# Patient Record
Sex: Male | Born: 1935 | Race: White | Hispanic: No | Smoking: Former smoker
Health system: Southern US, Community
[De-identification: ages and names within clinical notes are randomized; demographics above are authoritative.]

## PROBLEM LIST (undated history)

## (undated) DIAGNOSIS — R911 Solitary pulmonary nodule: Secondary | ICD-10-CM

## (undated) DIAGNOSIS — R945 Abnormal results of liver function studies: Secondary | ICD-10-CM

## (undated) DIAGNOSIS — L570 Actinic keratosis: Secondary | ICD-10-CM

## (undated) DIAGNOSIS — I10 Essential (primary) hypertension: Secondary | ICD-10-CM

## (undated) DIAGNOSIS — N400 Enlarged prostate without lower urinary tract symptoms: Secondary | ICD-10-CM

## (undated) DIAGNOSIS — E785 Hyperlipidemia, unspecified: Secondary | ICD-10-CM

## (undated) DIAGNOSIS — J189 Pneumonia, unspecified organism: Secondary | ICD-10-CM

## (undated) DIAGNOSIS — R0602 Shortness of breath: Secondary | ICD-10-CM

## (undated) DIAGNOSIS — D649 Anemia, unspecified: Secondary | ICD-10-CM

## (undated) DIAGNOSIS — R7989 Other specified abnormal findings of blood chemistry: Secondary | ICD-10-CM

## (undated) DIAGNOSIS — Q825 Congenital non-neoplastic nevus: Secondary | ICD-10-CM

## (undated) DIAGNOSIS — K5900 Constipation, unspecified: Secondary | ICD-10-CM

## (undated) DIAGNOSIS — K824 Cholesterolosis of gallbladder: Secondary | ICD-10-CM

## (undated) DIAGNOSIS — N200 Calculus of kidney: Secondary | ICD-10-CM

## (undated) DIAGNOSIS — M1990 Unspecified osteoarthritis, unspecified site: Secondary | ICD-10-CM

## (undated) DIAGNOSIS — E039 Hypothyroidism, unspecified: Secondary | ICD-10-CM

## (undated) DIAGNOSIS — Z77090 Contact with and (suspected) exposure to asbestos: Secondary | ICD-10-CM

## (undated) HISTORY — PX: APPENDECTOMY: SHX54

## (undated) HISTORY — DX: Cholesterolosis of gallbladder: K82.4

## (undated) HISTORY — DX: Contact with and (suspected) exposure to asbestos: Z77.090

## (undated) HISTORY — PX: COLONOSCOPY: SHX174

## (undated) HISTORY — DX: Benign prostatic hyperplasia without lower urinary tract symptoms: N40.0

## (undated) HISTORY — DX: Abnormal results of liver function studies: R94.5

## (undated) HISTORY — DX: Anemia, unspecified: D64.9

## (undated) HISTORY — PX: BACK SURGERY: SHX140

## (undated) HISTORY — PX: TONSILLECTOMY: SUR1361

## (undated) HISTORY — PX: OTHER SURGICAL HISTORY: SHX169

## (undated) HISTORY — DX: Hypothyroidism, unspecified: E03.9

## (undated) HISTORY — DX: Actinic keratosis: L57.0

## (undated) HISTORY — DX: Solitary pulmonary nodule: R91.1

## (undated) HISTORY — PX: CERVICAL DISC SURGERY: SHX588

## (undated) HISTORY — DX: Essential (primary) hypertension: I10

## (undated) HISTORY — PX: FOOT SURGERY: SHX648

## (undated) HISTORY — DX: Hyperlipidemia, unspecified: E78.5

## (undated) HISTORY — DX: Other specified abnormal findings of blood chemistry: R79.89

---

## 2000-02-07 ENCOUNTER — Ambulatory Visit (HOSPITAL_BASED_OUTPATIENT_CLINIC_OR_DEPARTMENT_OTHER): Admission: RE | Admit: 2000-02-07 | Discharge: 2000-02-07 | Payer: Self-pay | Admitting: Podiatry

## 2002-10-08 ENCOUNTER — Encounter: Payer: Self-pay | Admitting: Orthopaedic Surgery

## 2002-10-08 ENCOUNTER — Encounter: Admission: RE | Admit: 2002-10-08 | Discharge: 2002-10-08 | Payer: Self-pay | Admitting: Orthopaedic Surgery

## 2002-10-09 ENCOUNTER — Encounter: Payer: Self-pay | Admitting: Orthopaedic Surgery

## 2002-10-09 ENCOUNTER — Encounter: Admission: RE | Admit: 2002-10-09 | Discharge: 2002-10-09 | Payer: Self-pay | Admitting: Orthopaedic Surgery

## 2002-10-10 ENCOUNTER — Ambulatory Visit (HOSPITAL_BASED_OUTPATIENT_CLINIC_OR_DEPARTMENT_OTHER): Admission: RE | Admit: 2002-10-10 | Discharge: 2002-10-11 | Payer: Self-pay | Admitting: Orthopaedic Surgery

## 2002-11-05 ENCOUNTER — Encounter: Payer: Self-pay | Admitting: Thoracic Surgery

## 2002-11-05 ENCOUNTER — Ambulatory Visit (HOSPITAL_COMMUNITY): Admission: RE | Admit: 2002-11-05 | Discharge: 2002-11-05 | Payer: Self-pay | Admitting: Thoracic Surgery

## 2002-11-05 ENCOUNTER — Encounter (INDEPENDENT_AMBULATORY_CARE_PROVIDER_SITE_OTHER): Payer: Self-pay | Admitting: *Deleted

## 2003-01-01 ENCOUNTER — Encounter: Payer: Self-pay | Admitting: Thoracic Surgery

## 2003-01-01 ENCOUNTER — Encounter: Admission: RE | Admit: 2003-01-01 | Discharge: 2003-01-01 | Payer: Self-pay | Admitting: Thoracic Surgery

## 2003-05-01 ENCOUNTER — Encounter: Admission: RE | Admit: 2003-05-01 | Discharge: 2003-05-01 | Payer: Self-pay | Admitting: Thoracic Surgery

## 2003-05-03 ENCOUNTER — Emergency Department (HOSPITAL_COMMUNITY): Admission: EM | Admit: 2003-05-03 | Discharge: 2003-05-03 | Payer: Self-pay | Admitting: Emergency Medicine

## 2003-09-24 ENCOUNTER — Encounter: Admission: RE | Admit: 2003-09-24 | Discharge: 2003-09-24 | Payer: Self-pay | Admitting: Thoracic Surgery

## 2004-02-25 ENCOUNTER — Encounter: Admission: RE | Admit: 2004-02-25 | Discharge: 2004-02-25 | Payer: Self-pay | Admitting: Thoracic Surgery

## 2008-03-31 ENCOUNTER — Encounter: Admission: RE | Admit: 2008-03-31 | Discharge: 2008-03-31 | Payer: Self-pay | Admitting: Internal Medicine

## 2008-05-09 ENCOUNTER — Encounter: Admission: RE | Admit: 2008-05-09 | Discharge: 2008-05-09 | Payer: Self-pay | Admitting: Internal Medicine

## 2008-06-11 ENCOUNTER — Ambulatory Visit (HOSPITAL_COMMUNITY): Admission: RE | Admit: 2008-06-11 | Discharge: 2008-06-11 | Payer: Self-pay | Admitting: *Deleted

## 2008-08-16 ENCOUNTER — Encounter: Admission: RE | Admit: 2008-08-16 | Discharge: 2008-08-16 | Payer: Self-pay | Admitting: Internal Medicine

## 2010-01-20 ENCOUNTER — Encounter: Admission: RE | Admit: 2010-01-20 | Discharge: 2010-01-20 | Payer: Self-pay | Admitting: Internal Medicine

## 2010-08-24 NOTE — Op Note (Signed)
Christopher Roach, Christopher Roach                ACCOUNT NO.:  [id number]   MEDICAL RECORD NO.:  [id number]          PATIENT TYPE:  AMB   LOCATION:  ENDO                         FACILITY:  [facility]   PHYSICIAN:  Georgiana Spinner, M.D.    DATE OF BIRTH:  1931-02-09   DATE OF PROCEDURE:  06/11/2008  DATE OF DISCHARGE:                               OPERATIVE REPORT   PROCEDURE:  Upper endoscopy.   INDICATIONS:  Gastroesophageal reflux disease.  Hemoccult positivity.   ANESTHESIA:  Fentanyl 50 mcg, Versed 3 mg.   PROCEDURE:  With the patient mildly sedated in the left lateral  decubitus position, the Pentax videoscopic endoscope was inserted in the  mouth passed under direct vision through the esophagus which appeared  normal into the stomach.  Fundus, body, antrum, duodenal bulb, second  portion of duodenum all appeared normal.  From this point the endoscope  was slowly withdrawn taking circumferential views of the duodenal mucosa  until the endoscope been pulled back into stomach placed in retroflexion  to view the stomach from below.  The endoscope was straightened and  withdrawn taking circumferential views of the remaining gastric and  esophageal mucosa.  The patient's vital signs, pulse oximeter remained  stable.  The patient tolerated procedure well without apparent  complications.   FINDINGS:  Negative exam.   PLAN:  Proceed to colonoscopy.           ______________________________  Georgiana Spinner, M.D.     GMO/MEDQ  D:  06/11/2008  T:  06/11/2008  Job:  [id number]

## 2010-08-24 NOTE — Op Note (Signed)
[name], Christopher Roach                ACCOUNT NO.:  [id number]   MEDICAL RECORD NO.:  [id number]          PATIENT TYPE:  AMB   LOCATION:  ENDO                         FACILITY:  [facility]   PHYSICIAN:  Georgiana Spinner, M.D.    DATE OF BIRTH:  01/10/1931   DATE OF PROCEDURE:  DATE OF DISCHARGE:                               OPERATIVE REPORT   PROCEDURE:  Colonoscopy.   INDICATIONS:  Hemoccult positivity.   ANESTHESIA:  Fentanyl 25 mcg, Versed 2 mg.   PROCEDURE:  With the patient mildly sedated in the left lateral  decubitus position a rectal exam was performed which was unremarkable to  my limited examination.  Subsequently, the Pentax videoscopic  colonoscope was inserted in the rectum, passed under direct vision to  the cecum identified by ileocecal valve and appendiceal orifice, both of  which were photographed.  From this point the colonoscope was slowly  withdrawn taking circumferential views of the colonic mucosa, stopping  in the rectum which appeared normal on direct and showed hemorrhoids on  retroflexed view.  The endoscope was straightened and withdrawn.  The  patient's vital signs and pulse oximeter remained stable.  The patient  tolerated the procedure well without apparent complications.   FINDINGS:  Hemorrhoids and some diverticulosis, fairly mild in the  sigmoid colon.  Otherwise unremarkable exam.   PLAN:  To have the patient follow-up with me as an outpatient.           ______________________________  Georgiana Spinner, M.D.     GMO/MEDQ  D:  06/11/2008  T:  06/11/2008  Job:  [id number]

## 2010-08-27 NOTE — Op Note (Signed)
NAMEAITAN, Christopher Roach                          ACCOUNT NO.:  [id number]   MEDICAL RECORD NO.:  [id number]                   PATIENT TYPE:  AMB   LOCATION:  DSC                                  FACILITY:  [facility]   PHYSICIAN:  Claude Manges. Cleophas Dunker, M.D.            DATE OF BIRTH:  01/25/1931   DATE OF PROCEDURE:  10/10/2002  DATE OF DISCHARGE:                                 OPERATIVE REPORT   PREOPERATIVE DIAGNOSIS:  1. Rotator cuff tear with impingement of right shoulder.  2. Degenerative joint disease acromioclavicular joint, right shoulder.   POSTOPERATIVE DIAGNOSIS:  1. Rotator cuff tear with impingement of right shoulder.  2. Degenerative joint disease acromioclavicular joint, right shoulder.   OPERATION PERFORMED:  1. Arthroscopic subacromial decompression.  2. Open distal clavicle resection.  3. Mini open rotator cuff tear repair.   SURGEON:  Claude Manges. Cleophas Dunker, M.D.   ASSISTANT:  Richardean Canal, P.A.   ANESTHESIA:  General orotracheal anesthesia.   COMPLICATIONS:  None.   INDICATIONS FOR PROCEDURE:  The patient is a 77 year old gentleman who has  had a progressive five-month history of right shoulder pain.  He fell about  a year and a half ago on a fishing trip and jammed his elbow against his  shoulder.  He thinks that is probably the cause of his pain, but it has  reached a point where he has difficulty raising his arm over his head,  performing his activities of daily living, his avocational endeavors and  even trouble sleeping.  He has had an MRI scan that reveals a full thickness  tear of the leading edge of the supraspinatus tendon associated with  tendinopathy of the infraspinatus and biceps tendons.  He has mild AC joint  degenerative changes with capsular hypertrophy.  He is tender over the [facility]  joint.  He is now to have an arthroscopic evaluation and distal clavicle  resection and rotator cuff tear repair.   DESCRIPTION OF PROCEDURE:  With the patient  comfortable on the operating  table, and under general orotracheal anesthesia and supplemental  interscalene block, the patient was placed in semisitting position with the  shoulder frame.  The right shoulder was then prepped with DuraPrep from the  base of the neck circumferentially below the elbow.  Sterile draping was  performed.   A marking pen was used to outline the [facility] joint.  The acromion and the  coracoid at a point a fingerbreadth inferomedial to the posterior angle of  the acromion, a small stab wound was made prior to which 0.25% Marcaine with  epinephrine was injected.  The arthroscope was easily placed into the  shoulder joint.  Arthroscopic evaluation revealed an intact humeral head  without chondromalacia of the glenoid or the humeral head.  The anterior  glenoid labrum was intact as was the biceps tendon.  I did not see an  obvious rotator cuff tear.  The arthroscope was then placed in the subacromial space posteriorly.  The  diagnostic arthroscopy of the subacromial space was performed.  There was  abundant bursal tissue and obvious impingement anteriorly.  I thought I  could recognize a cuff tear of the supraspinatus.  There were degenerative  changes at the [facility] joint with inferior impingement of the clavicle on the  rotator cuff.  A second and third portal was established mid laterally  anteriorly and arthroscopic subacromial decompression was performed using  the ArthroCare wand, the great white shaver and the 6 mm hooded bur.  I had  a very nice decompression.   An open distal clavicle resection was performed utilizing about a 3/4 inch  incision directly over the [facility] joint.  By sharp dissection, incision was  carried down to subcutaneous tissue done by Bovie dissection.  Soft tissue  was dissected to the level of the [facility] joint.  The [facility] joint capsule was  incised.  Subperiosteal dissection was performed along the distal centimeter  of the clavicle. Oscillating saw was  used to remove the distal clavicle.  Bone wax was applied.  A rasp was used to further debride any extraneous  distal clavicle and with finger palpation, I could not feel any evidence of  impingement beneath the acromion or the distal remaining clavicle.  The  wound was irrigated with saline solution.  The [facility] joint capsule was closed  with interrupted 0 Vicryl, subcu with 2-0 Vicryl, skin closed with skin  clips.   A mini open incision was then performed at the junction of the anterolateral  shoulder.  About a 1-1/2 inch incision was utilized and by sharp dissection  carried down to subcutaneous tissue.  The anterior portal was incorporated  into the incision.  By blunt dissection, a raphe within the deltoid muscle  was identified and just separated.  The subacromial space was identified and  irrigation fluid was removed.  With motion of the arm I could see an obvious  tear of the rotator cuff at the supraspinatus attachment to the humeral  head.  It was full thickness.  The edges were rounded and these were sharply  debrided.  As there was no tissue to repair,  I used one Mitek anchor with  an excellent repair.  Again, by palpation, I felt I had an excellent  subacromial decompression.  The wound was irrigated with saline solution,  deltoid muscle and fascia were closed anatomically with interrupted 0  Vicryl.  The subcu closed with 2-0 Vicryl, skin closed with skin clips.  0.25% Marcaine with epinephrine injected into the wound edges.  Sterile  bulky dressing was applied followed by a sling.   PLAN:  Recovery care center.  Percocet for pain.  Office one week.                                               Claude Manges. Cleophas Dunker, M.D.    PWW/MEDQ  D:  10/10/2002  T:  10/11/2002  Job:  [id number]

## 2010-08-27 NOTE — Op Note (Signed)
[facility]  Patient:    [name], Christopher Roach                       MRN: [id number] Proc. Date: 02/07/00 Adm. Date:  [id number] Disc. Date: [id number] Attending:  Cordella Register                           Operative Report  PREOPERATIVE DIAGNOSIS:  Chronic plantar fasciitis of the left.  POSTOPERATIVE DIAGNOSIS:  Chronic plantar fasciitis of the left.  OPERATION:  Endoscopic release of the medial and partial central band of the left plantar fascia.  SURGEON:  Cordella Register, D.P.M.  ANESTHESIA:  IV sedation with local infiltration.  HEMOSTASIS:  Ankle tourniquet left.  INDICATION FOR PROCEDURE:  Chronic pain with an inability to get better with shoe gear modifications, anti-inflammatory therapy, physical therapy and steroidal therapy with a debilitating type of pain.  FINDINGS AND PROCEDURE:  The patient is brought to the operating room and placed in a supine position on the operating room table.  The patient was injected with a total of 5 cc xylocaine and 10 cc of Marcaine with xylocaine in an infiltration block.  The patients left foot was prepped and draped utilizing standard aseptic technique.  The left foot was exsanguinated utilizing Esmarch and the left ankle tourniquet was inflated to 250 mmHg.  The following procedure was performed endoscopically.  Releasing the medial fascia down left.  Attention was directed to the medial aspect of the left foot, where a small 1.5 cm incision was made distal to the insertion of the plantar fascia into the calcaneus.  A hemostat was introduced and an elevator was introduced so as to identify the fascial band in a dorsal direction.  A cannula was then introduced, brought through to the lateral side where a small stab incision was made for an exit point.  The scope, 30 degrees was then introduced into the site and it was found that there was a thickened plantar fascial band identifiable within the  dorsal part of the scope field.  Using a triangular cutting blade from the lateral side, the medial and partial central band was transected.  Muscle belly was found to be exposed indicating that we got a good release of the medial and central plantar fascial band. It was rotated to the lateral side and the blade was brought through the medial side and a good release was secured.  The wound was flushed with sterile water and the wound edges were reapproximated utilizing 5-0 Nylon in simple interrupted fashion.  Surgical site was infiltrated with 1 cc of Dexamethasone and 2 cc of xylocaine and Marcaine mixture.  A sterile dressing was applied to the left foot.  Tourniquet was released.  Capillary flow was noted immediately to all digits of the left foot.  The patient tolerated the surgery and anesthesia well, was transferred out of the operating room in satisfactory condition with no complications, discharged by the Department of Anesthesia. DD:  02/07/00 TD:  02/09/00 Job: [id number]

## 2012-03-27 ENCOUNTER — Other Ambulatory Visit: Payer: Self-pay | Admitting: Internal Medicine

## 2012-03-29 ENCOUNTER — Ambulatory Visit
Admission: RE | Admit: 2012-03-29 | Discharge: 2012-03-29 | Disposition: A | Discharge status: Home / Self Care | Payer: Medicare Other | Source: Ambulatory Visit | Attending: Internal Medicine | Admitting: Internal Medicine

## 2012-10-18 ENCOUNTER — Other Ambulatory Visit: Payer: Self-pay | Admitting: Internal Medicine

## 2012-10-18 DIAGNOSIS — R634 Abnormal weight loss: Secondary | ICD-10-CM

## 2012-10-18 DIAGNOSIS — R079 Chest pain, unspecified: Secondary | ICD-10-CM

## 2012-10-18 DIAGNOSIS — R52 Pain, unspecified: Secondary | ICD-10-CM

## 2012-10-23 ENCOUNTER — Ambulatory Visit
Admission: RE | Admit: 2012-10-23 | Discharge: 2012-10-23 | Disposition: A | Discharge status: Home / Self Care | Payer: Medicare Other | Source: Ambulatory Visit | Attending: Internal Medicine | Admitting: Internal Medicine

## 2012-10-23 DIAGNOSIS — R634 Abnormal weight loss: Secondary | ICD-10-CM

## 2012-10-23 DIAGNOSIS — R079 Chest pain, unspecified: Secondary | ICD-10-CM

## 2012-11-02 ENCOUNTER — Other Ambulatory Visit (HOSPITAL_COMMUNITY): Payer: Self-pay | Admitting: Internal Medicine

## 2012-11-02 DIAGNOSIS — R911 Solitary pulmonary nodule: Secondary | ICD-10-CM

## 2012-11-06 ENCOUNTER — Encounter (HOSPITAL_COMMUNITY)
Admission: RE | Admit: 2012-11-06 | Discharge: 2012-11-06 | Disposition: A | Discharge status: Home / Self Care | Payer: Medicare Other | Source: Ambulatory Visit | Attending: Internal Medicine | Admitting: Internal Medicine

## 2012-11-06 DIAGNOSIS — Z77090 Contact with and (suspected) exposure to asbestos: Secondary | ICD-10-CM | POA: Insufficient documentation

## 2012-11-06 DIAGNOSIS — R911 Solitary pulmonary nodule: Secondary | ICD-10-CM | POA: Insufficient documentation

## 2012-11-06 DIAGNOSIS — J9819 Other pulmonary collapse: Secondary | ICD-10-CM | POA: Insufficient documentation

## 2012-11-06 LAB — GLUCOSE, CAPILLARY: Glucose-Capillary: 89 mg/dL (ref 70–99)

## 2012-11-06 MED ORDER — FLUDEOXYGLUCOSE F - 18 (FDG) INJECTION
18.7000 | Freq: Once | INTRAVENOUS | Status: AC | PRN
  Administered 2012-11-06: 18.7 via INTRAVENOUS

## 2012-11-07 DIAGNOSIS — R911 Solitary pulmonary nodule: Secondary | ICD-10-CM | POA: Insufficient documentation

## 2012-11-08 ENCOUNTER — Institutional Professional Consult (permissible substitution) (INDEPENDENT_AMBULATORY_CARE_PROVIDER_SITE_OTHER): Payer: Medicare Other | Admitting: Surgery

## 2012-11-08 VITALS — BP 140/73 | HR 62 | Temp 97.1°F | Resp 18 | Ht 70.0 in | Wt 197.0 lb

## 2012-11-08 DIAGNOSIS — R911 Solitary pulmonary nodule: Secondary | ICD-10-CM

## 2012-11-09 ENCOUNTER — Other Ambulatory Visit: Payer: Self-pay | Admitting: *Deleted

## 2012-11-09 DIAGNOSIS — N200 Calculus of kidney: Secondary | ICD-10-CM

## 2012-11-09 DIAGNOSIS — R911 Solitary pulmonary nodule: Secondary | ICD-10-CM

## 2012-11-09 HISTORY — DX: Calculus of kidney: N20.0

## 2012-11-10 ENCOUNTER — Encounter: Payer: Self-pay | Admitting: Surgery

## 2012-11-10 NOTE — Progress Notes (Signed)
[address]       [place] [address]             [phone]      [facility]  PCP is [name], MD Referring Provider is Massie Maroon., MD  Reason for consultation:  Left upper lobe lung mass   HPI:  The patient is an 79 year old prior smoker who presents with a history of recent weight loss and intermittent left chest pain for the past several months. He was referred to cardiology and seen by Dr. Jacinto Halim who did not feel that he had any significant cardiac disease but may have had pneumonia. A CT scan the chest on 10/23/2012 showed an irregular 2.7 x 2.2 x 2.7 cm mass in the medial portion of the left upper lobe adjacent to the aortopulmonary window. There is a slightly prominent lymph node in the aortopulmonary window adjacent to the mass in the left upper lobe. 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 There were no other areas of hypermetabolic activity. It was not possible to tell if this prominent lymph node in the aortopulmonary window was hypermetabolic since it was so close to the tumor. The patient reports that he has continued to have intermittent left-sided chest pain which he says is anteriorly. He has also noticed some intermittent weakness in his voice which he says usually occurs after he coughs up some phlegm but may last for hours.  Past Medical History  Diagnosis Date  . Hypertension   . Hyperlipidemia   . Hypothyroidism   . Abnormal LFTs   . Gallbladder polyp   . BPH (benign prostatic hyperplasia)   . Actinic keratosis   . Lung nodule     benign BX 03/24/2008  . Anemia   . H/O asbestos exposure     Past Surgical History  Procedure Laterality Date  . Back surgery      Dr Fannie Knee 1978 and 1982  . Cervical disc surgery    . Right foot surgery      plantar fascitis  . Right shoulder surgery     torn rotator cuff  . Right ankle fracture         . Right wrist fracture      Family History  Problem Relation Age of Onset  . Hypertension Father   . Heart disease Father     smoker  . Alzheimer's disease Mother   . Breast cancer Sister   . Skin cancer Brother   . Heart disease Son     Social History History  Substance Use Topics  . Smoking status: Former Smoker -- 1.00 packs/day for 40 years    Types: Cigarettes  . Smokeless tobacco: Not on file  . Alcohol Use: No    Current Outpatient Prescriptions  Medication Sig Dispense Refill  . amLODipine (NORVASC) 10 MG tablet Take 10 mg by mouth daily.      Marland Kitchen aspirin 81 MG tablet Take 81 mg by mouth daily.      Marland Kitchen levothyroxine (SYNTHROID, LEVOTHROID) 125 MCG tablet Take 125 mcg by mouth daily before breakfast.      . simvastatin (ZOCOR) 10 MG tablet Take 10 mg by mouth at bedtime.      . tamsulosin (FLOMAX) 0.4 MG CAPS Take 0.4 mg by mouth daily.      Marland Kitchen triamterene-hydrochlorothiazide (  DYAZIDE) 37.5-25 MG per capsule Take 1 capsule by mouth every morning.      . vardenafil (LEVITRA) 20 MG tablet Take 20 mg by mouth daily as needed for erectile dysfunction.      Marland Kitchen zolpidem (AMBIEN) 10 MG tablet Take 10 mg by mouth at bedtime as needed for sleep.       No current facility-administered medications for this visit.    No Known Allergies  Review of Systems  Constitutional: Positive for unexpected weight change.  HENT: Positive for voice change.   Eyes: Negative.   Respiratory: Positive for cough.   Cardiovascular: Negative for chest pain and leg swelling.  Gastrointestinal: Negative.   Endocrine: Negative.   Genitourinary: Negative.   Musculoskeletal: Negative for myalgias and arthralgias.  Skin: Negative.   Allergic/Immunologic: Negative.   Neurological: Negative.   Hematological: Negative.   Psychiatric/Behavioral: Negative.     BP 140/73  Pulse 62  Temp(Src) 97.1 F (36.2 C) (Oral)  Resp 18  Ht 5\' 10"  (1.778 m)   Wt 197 lb (89.359 kg)  BMI 28.27 kg/m2 Physical Exam  Constitutional: He is oriented to person, place, and time. He appears well-developed and well-nourished. No distress.  HENT:  Head: Normocephalic and atraumatic.  Mouth/Throat: Oropharynx is clear and moist.  Voice sounds strong at this time. Does not sound hoarse.  Eyes: Conjunctivae and EOM are normal. Pupils are equal, round, and reactive to light.  Neck: Normal range of motion. Neck supple. No JVD present. No tracheal deviation present. No thyromegaly present.  Cardiovascular: Normal rate, regular rhythm, normal heart sounds and intact distal pulses.   No murmur heard. Pulmonary/Chest: Effort normal. No respiratory distress. He has no wheezes. He has no rales. He exhibits no tenderness.  Abdominal: Soft. Bowel sounds are normal. He exhibits no distension and no mass. There is no tenderness.  Musculoskeletal: Normal range of motion. He exhibits no edema.  Lymphadenopathy:    He has no cervical adenopathy.  Neurological: He is alert and oriented to person, place, and time. He has normal strength. No cranial nerve deficit or sensory deficit.  Skin: Skin is warm and dry.  Psychiatric: He has a normal mood and affect.     Diagnostic Tests:  *RADIOLOGY REPORT*   Clinical Data: Initial treatment strategy for pulmonary nodule.   NUCLEAR MEDICINE PET SKULL BASE TO THIGH   Fasting Blood Glucose:  89   Technique:  18.7 mCi F-18 FDG was injected intravenously. CT data was obtained and used for attenuation correction and anatomic localization only.  (This was not acquired as a diagnostic CT examination.) Additional exam technical data entered on technologist worksheet.   Comparison:  10/23/2012   Findings:   Neck: No hypermetabolic lymph nodes in the neck.   Chest:  Tumor within the paramediastinal left upper lobe is identified.  This measures 2.1 x 3.2 x 3.0 cm and has an SUV max equal to 21.4.  The tumor is abutting the  mediastinal fat and may be invading the AP window region.   There is no postobstructive consolidation or atelectasis associated with this mass.  Calcified pleural plaques are noted bilaterally compatible with prior asbestos exposure.  There is an area of rounded atelectasis noted within the left lower lobe, image 110.   Abdomen/Pelvis:  No abnormal hypermetabolic activity within the liver, pancreas, adrenal glands, or spleen.  The liver has a irregular contour suggestive of cirrhosis.  No hypermetabolic liver lesion identified.No hypermetabolic lymph nodes in the abdomen or pelvis.  There is no significant FDG uptake associated with the low attenuation nodule in the left lobe of thyroid gland measuring 1.3 cm.  Cysts are noted within the right kidney.  There is marked enlargement of the prostate gland.   Skeleton:  No focal hypermetabolic activity to suggest skeletal metastasis.   IMPRESSION:   1.  There is malignant range FDG uptake associated with the left upper lobe paramediastinal tumor. If this is a non-small cell lung cancer then the stage, based upon the imaging findings, would be T3N0M0.   2.  Prior asbestos exposure. 3.  Left lower lobe rounded atelectasis. 4.  Nodular contour of the liver.  Correlate for any clinical signs or symptoms of cirrhosis.     Original Report Authenticated By: Signa Kell, M.D.    *RADIOLOGY REPORT*   Clinical Data: Headaches for several months, occasional vertigo. Also history of lung carcinoma.   CT HEAD WITHOUT CONTRAST   Technique:  Contiguous axial images were obtained from the base of the skull through the vertex without contrast.   Comparison: MR of the brain of 08/16/2008   Findings: The ventricular system is stable in size and configuration, and some prominence of cortical sulci and cerebellar folia remains, consistent with diffuse atrophy.  The septum is in a normal midline position.  There has been slight progression  of small vessel ischemic change primarily in the periventricular white matter of the posterior parietal - occipital regions. No acute infarction, hemorrhage, or mass lesion is seen.  On bone window images there is some mucosal thickening within right ethmoid air cells.  No air-fluid level is seen.  No acute calvarial abnormality is noted.   IMPRESSION:   1.  Some progression of small vessel ischemic change.  No acute intracranial abnormality. 2.  Diffuse atrophy. 3.  Mild mucosal thickening within right ethmoid air cells     Original Report Authenticated By: Dwyane Dee, M.D.   *RADIOLOGY REPORT*   Clinical Data:  Weight loss over several weeks, intermittent left chest pain for several months, remote smoking history   CT CHEST, ABDOMEN AND PELVIS WITHOUT CONTRAST   Technique:  Multidetector CT imaging of the chest, abdomen and pelvis was performed following the standard protocol without IV contrast.   Comparison:  Ultrasound the abdomen of 03/29/2012   CT CHEST   Findings:  On the lung window images, there are mild changes of centrilobular emphysema present.  Within the left lung base posterolaterally there is an oval opacity of 3.1 x 4.5 cm most likely representing rounded atelectasis. There is some volume loss throughout the left hemithorax with pleural thickening and foci of calcification as well. These changes appear chronic with no evidence of central endobronchial lesion.   However, within the medial left upper lobe abutting and most likely involving the left mediastinum there is an irregular mass of 2.7 x 2.2 by 2.7 cm suspicious for primary lung carcinoma.  The central bronchi appear patent, although the bronchi to the left upper lobe do appear to be somewhat narrowed.  Throughout the right lung no lung nodule or effusion is seen.  Minimal peribronchovascular nodularity is noted the right lower lobe which may indicate an atypical inflammatory process.   On  soft tissue window images, there is a slightly prominent lymph node in the left superior mediastinum near the left upper lobe spiculated lesion of 9 mm in short axis diameter.  Remainder of mediastinal and hilar nodes are within normal limits in size.   IMPRESSION:  1.  Irregularly marginated lesion within the medial left upper lobe abutting the mediastinum suspicious for primary lung carcinoma. 2.  Small adjacent node of 9 mm in short-axis diameter may represent metastatic involvement. 3.  Rounded opacity in the left lower lobe may represent rounded atelectasis.  PET CT would be recommended to assess all of these areas further. 4.  Centrilobular emphysema.   CT ABDOMEN AND PELVIS   Findings:  There are a few subtle slightly low attenuation lesions within the liver on this unenhanced study which may represent metastatic lesions, one involving the left lobe with the left lobe being somewhat hypoplastic, and a second involving the anterolateral right lobe. There is also question of a third lesion in the inferior right lobe which bulges the contours of the right lobe, not well assessed on this unenhanced study.  In addition to metastatic involvement, multicentric hepatoma would also be a consideration.  If IV contrast cannot be given then MRI would be recommended to assess these areas further.   No calcified gallstones are seen.  The pancreas is normal in size and the pancreatic duct is not dilated. The right adrenal glands unremarkable.  There is minimal nodularity of the left adrenal gland which may represent an incidental adrenal adenoma being somewhat low in attenuation.  The spleen is unremarkable.  The stomach is not well distended with slight prominence of the mucosa at the GE junction of questionable significance.  There is a calculus in the lower pole of the left collecting system of 4 mm in diameter.  Several low attenuation  right renal lesions are noted most consistent  with cysts, difficult to assess well on this unenhanced study.  The abdominal aorta is normal in caliber with diffuse atheromatous change.  No adenopathy is seen.   The prostate is significantly enlarged measuring 6.6 x 6.1 cm, indenting the posterior inferior urinary bladder.  The urinary bladder is not well distended but is slightly thick-walled.  There are multiple rectosigmoid colonic diverticula noted.  The terminal ileum is unremarkable. However, there is a segment of distal small bowel that does appear to demonstrate mucosal thickening, and clinical correlation is recommended.  Inflammatory bowel disease would be difficult to exclude.  There is a rounded filling defect within the ileum on axial image 90 and coronal image 72 measuring 9 mm in diameter.  A polypoid lesion cannot be excluded within small bowel.  No evidence of metastatic involvement of the bony skeleton is seen.  The appendix has previously been resected.   IMPRESSION:   1.  On this unenhanced study, there is suspicion of several liver lesions.  If IV contrast cannot be given, then MRI would be recommended to exclude multicentric hepatocellular carcinoma versus metastatic involvement of the liver.   2.  4 mm nonobstructing left lower pole renal calculus   3.  Significantly enlarged prostate.   4.  Somewhat thickened mucosa of a distal ileal bowel loop of questionable significance.  Cannot exclude inflammatory bowel disease.   5.  9 mm polypoid type lesion within the distal small bowel as well.     Original Report Authenticated By: Dwyane Dee, M.D.     *RADIOLOGY REPORT*   Clinical Data:  Weight loss over several weeks, intermittent left chest pain for several months, remote smoking history   CT CHEST, ABDOMEN AND PELVIS WITHOUT CONTRAST   Technique:  Multidetector CT imaging of the chest, abdomen and pelvis was performed following the standard protocol without IV contrast.   Comparison:  Ultrasound the abdomen of 03/29/2012   CT CHEST   Findings:  On the lung window images, there are mild changes of centrilobular emphysema present.  Within the left lung base posterolaterally there is an oval opacity of 3.1 x 4.5 cm most likely representing rounded atelectasis. There is some volume loss throughout the left hemithorax with pleural thickening and foci of calcification as well. These changes appear chronic with no evidence of central endobronchial lesion.   However, within the medial left upper lobe abutting and most likely involving the left mediastinum there is an irregular mass of 2.7 x 2.2 by 2.7 cm suspicious for primary lung carcinoma.  The central bronchi appear patent, although the bronchi to the left upper lobe do appear to be somewhat narrowed.  Throughout the right lung no lung nodule or effusion is seen.  Minimal peribronchovascular nodularity is noted the right lower lobe which may indicate an atypical inflammatory process.   On soft tissue window images, there is a slightly prominent lymph node in the left superior mediastinum near the left upper lobe spiculated lesion of 9 mm in short axis diameter.  Remainder of mediastinal and hilar nodes are within normal limits in size.   IMPRESSION:   1.  Irregularly marginated lesion within the medial left upper lobe abutting the mediastinum suspicious for primary lung carcinoma. 2.  Small adjacent node of 9 mm in short-axis diameter may represent metastatic involvement. 3.  Rounded opacity in the left lower lobe may represent rounded atelectasis.  PET CT would be recommended to assess all of these areas further. 4.  Centrilobular emphysema.   CT ABDOMEN AND PELVIS   Findings:  There are a few subtle slightly low attenuation lesions within the liver on this unenhanced study which may represent metastatic lesions, one involving the left lobe with the left lobe being somewhat hypoplastic, and a second  involving the anterolateral right lobe. There is also question of a third lesion in the inferior right lobe which bulges the contours of the right lobe, not well assessed on this unenhanced study.  In addition to metastatic involvement, multicentric hepatoma would also be a consideration.  If IV contrast cannot be given then MRI would be recommended to assess these areas further.   No calcified gallstones are seen.  The pancreas is normal in size and the pancreatic duct is not dilated. The right adrenal glands unremarkable.  There is minimal nodularity of the left adrenal gland which may represent an incidental adrenal adenoma being somewhat low in attenuation.  The spleen is unremarkable.  The stomach is not well distended with slight prominence of the mucosa at the GE junction of questionable significance.  There is a calculus in the lower pole of the left collecting system of 4 mm in diameter.  Several low attenuation  right renal lesions are noted most consistent with cysts, difficult to assess well on this unenhanced study.  The abdominal aorta is normal in caliber with diffuse atheromatous change.  No adenopathy is seen.   The prostate is significantly enlarged measuring 6.6 x 6.1 cm, indenting the posterior inferior urinary bladder.  The urinary bladder is not well distended but is slightly thick-walled.  There are multiple rectosigmoid colonic diverticula noted.  The terminal ileum is unremarkable. However, there is a segment of distal small bowel that does appear to demonstrate mucosal thickening, and clinical correlation is recommended.  Inflammatory bowel disease would be difficult to exclude.  There is a rounded filling defect within the ileum  on axial image 90 and coronal image 72 measuring 9 mm in diameter.  A polypoid lesion cannot be excluded within small bowel.  No evidence of metastatic involvement of the bony skeleton is seen.  The appendix has previously been  resected.   IMPRESSION:   1.  On this unenhanced study, there is suspicion of several liver lesions.  If IV contrast cannot be given, then MRI would be recommended to exclude multicentric hepatocellular carcinoma versus metastatic involvement of the liver.   2.  4 mm nonobstructing left lower pole renal calculus   3.  Significantly enlarged prostate.   4.  Somewhat thickened mucosa of a distal ileal bowel loop of questionable significance.  Cannot exclude inflammatory bowel disease.   5.  9 mm polypoid type lesion within the distal small bowel as well.     Original Report Authenticated By: Dwyane Dee, M.D.     Impression:  He has an irregular 2.7 x 2.2 x 2.7 cm mass in the medial aspect of the left upper lobe adjacent to the mediastinum. This looks like it could be invading the aortopulmonary window. There does not appear to be a clear plane between the mass and the aorta and pulmonary artery. He has had some intermittent weakness in his voice which could indicate that he has some involvement of the left recurrent laryngeal nerve which courses through this region. This could also be due to some phlegm on his vocal cords. There are no other areas of hypermetabolic activity on his PET scan and his CT scan of the brain is negative. He is 82 years but is in excellent shape otherwise and remains active and independent. If his pulmonary function is adequate to tolerate a left upper lobectomy then I think that would be the best option for him. At this point I don't think I can clearly tell if this lesion is resectable since it is right up against the aortopulmonary window and there is no clear plane between the mass and the major vascular structures. I think the best option would be to proceed with thoracoscopy to evaluate this further and if it appears that this mass is not invading the surrounding structures then I would proceed with left upper lobectomy. I reviewed all the scans with the  patient and his family. All their questions have been answered. They're in agreement with proceeding with further workup to determine if he is a surgical candidate.   Plan:  He will have pulmonary function testing performed to determine if he is a candidate for left upper lobectomy. I will see him after that to review the results with him and make surgical plans if indicated.

## 2012-11-21 ENCOUNTER — Encounter (HOSPITAL_COMMUNITY): Payer: Medicare Other

## 2012-11-21 ENCOUNTER — Ambulatory Visit: Payer: Medicare Other | Admitting: Surgery

## 2012-11-28 ENCOUNTER — Ambulatory Visit (INDEPENDENT_AMBULATORY_CARE_PROVIDER_SITE_OTHER): Payer: Medicare Other | Admitting: Surgery

## 2012-11-28 ENCOUNTER — Ambulatory Visit (HOSPITAL_COMMUNITY)
Admission: RE | Admit: 2012-11-28 | Discharge: 2012-11-28 | Disposition: A | Discharge status: Home / Self Care | Payer: Medicare Other | Source: Ambulatory Visit | Attending: Surgery | Admitting: Surgery

## 2012-11-28 ENCOUNTER — Encounter: Payer: Self-pay | Admitting: Surgery

## 2012-11-28 ENCOUNTER — Other Ambulatory Visit: Payer: Self-pay | Admitting: *Deleted

## 2012-11-28 VITALS — BP 149/69 | HR 68 | Resp 16 | Ht 70.0 in | Wt 197.0 lb

## 2012-11-28 DIAGNOSIS — R911 Solitary pulmonary nodule: Secondary | ICD-10-CM | POA: Insufficient documentation

## 2012-11-28 DIAGNOSIS — R918 Other nonspecific abnormal finding of lung field: Secondary | ICD-10-CM

## 2012-11-28 LAB — PULMONARY FUNCTION TEST

## 2012-11-28 MED ORDER — ALBUTEROL SULFATE (5 MG/ML) 0.5% IN NEBU
2.5000 mg | INHALATION_SOLUTION | Freq: Once | RESPIRATORY_TRACT | Status: AC
  Administered 2012-11-28: 2.5 mg via RESPIRATORY_TRACT

## 2012-11-28 NOTE — Progress Notes (Signed)
[address]       [place] [address]             [phone]         HPI:  The patient is an 79 year old prior smoker who presents with a history of recent weight loss and intermittent left chest pain for the past several months. He was referred to cardiology and seen by Dr. Jacinto Halim who did not feel that he had any significant cardiac disease but may have had pneumonia. A CT scan the chest on 10/23/2012 showed an irregular 2.7 x 2.2 x 2.7 cm mass in the medial portion of the left upper lobe adjacent to the aortopulmonary window. There is a slightly prominent lymph node in the aortopulmonary window adjacent to the mass in the left upper lobe. 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 There were no other areas of hypermetabolic activity. It was not possible to tell if this prominent lymph node in the aortopulmonary window was hypermetabolic since it was so close to the tumor. The patient reports that he has continued to have intermittent left-sided chest pain which he says is anteriorly. The intermittent weakness in his voice has resolved. He returns today for further evaluation for left upper lobectomy after having PFT's.   Current Outpatient Prescriptions  Medication Sig Dispense Refill  . amLODipine (NORVASC) 10 MG tablet Take 10 mg by mouth daily.      Marland Kitchen aspirin 81 MG tablet Take 81 mg by mouth daily.      Marland Kitchen ibuprofen (ADVIL,MOTRIN) 200 MG tablet Take 200 mg by mouth every 6 (six) hours as needed for pain.      Marland Kitchen levothyroxine (SYNTHROID, LEVOTHROID) 125 MCG tablet Take 125 mcg by mouth daily before breakfast.      . rosuvastatin (CRESTOR) 10 MG tablet Take 10 mg by mouth daily.      . tamsulosin (FLOMAX) 0.4 MG CAPS Take 0.4 mg by mouth daily.      Marland Kitchen zolpidem (AMBIEN) 10 MG tablet Take 10 mg by mouth at bedtime as needed for sleep.       No current  facility-administered medications for this visit.     Physical Exam: BP 149/69  Pulse 68  Resp 16  Ht 5\' 10"  (1.778 m)  Wt 197 lb (89.359 kg)  BMI 28.27 kg/m2  SpO2 94% He looks well. Lung exam is clear. There is no cervical or supraclavicular adenopathy Cardiac exam shows a regular rate and rhythm with normal heart sounds.  Diagnostic Tests:  Pulmonary function testing dated 11/28/2012 shows an FEV1 of 1.82 which is 63% of predicted. FVC is 2.84 or 70% of predicted. Corrected diffusion capacity is 53% of predicted. His spirometry improve significantly postbronchodilator with improvement in his FEV1 to 2.1.   Impression:  I think his lung function is adequate to tolerate a left upper lobectomy if his tumor is resectable. I'm still concerned that there may be invasion of the aortopulmonary window region which may preclude surgical resection. I reviewed the CT scan and PET scan findings with the patient and his daughter and other family members. I told him I would plan to perform left VATS to try to determine if this lesion looks resectable before proceeding with thoracotomy. I explained to him that even though it appears resectable by VATS that I may do a thoracotomy and find  it is not resectable. I discussed the possibility of lymph node metastasis requiring postoperative chemotherapy. I discussed the alternative of nonsurgical therapy with chemotherapy and radiation therapy. He would like to proceed with surgical therapy. I discussed the operative procedure in detail with him and his family. We discussed alternatives, benefits, and risks including but not limited to bleeding, blood transfusion, injury to lung or major vascular structures, injury to the left recurrent laryngeal nerve with hoarseness, incomplete resection, postoperative bronchial stump complications, prolonged air leak, respiratory failure, and recurrence of cancer. He understands all this and would like to proceed as soon as  possible. I scheduled the surgery for Friday, 12/07/2012.  Plan:  Flexible fiberoptic bronchoscopy followed by left thoracoscopy and possible left thoracotomy for left upper lobectomy and lymph node resection on 12/07/2012

## 2012-12-03 ENCOUNTER — Encounter (HOSPITAL_COMMUNITY): Payer: Self-pay | Admitting: Pharmacy Technician

## 2012-12-04 NOTE — Pre-Procedure Instructions (Signed)
OLLIE ESTY  12/04/2012   Your procedure is scheduled on:  Friday December 07, 2012.  Report to [facility] at 7:15 AM.  Call this number if you have problems the morning of surgery: [phone]   Remember:   Do not eat food or drink liquids after midnight.   Take these medicines the morning of surgery with A SIP OF WATER: None   Do not wear jewelry.  Do not wear lotions, powders, or colognes. You may NOT wear deodorant.  Men may shave face and neck.  Do not bring valuables to the hospital.  [facility] is not responsible for any belongings or valuables.  Contacts, dentures or bridgework may not be worn into surgery.  Leave suitcase in the car. After surgery it may be brought to your room.  For patients admitted to the hospital, checkout time is 11:00 AM the day of discharge.   Patients discharged the day of surgery will not be allowed to drive home.  Name and phone number of your driver: Family/Friend  Special Instructions: Shower using CHG 2 nights before surgery and the night before surgery.  If you shower the day of surgery use CHG.  Use special wash - you have one bottle of CHG for all showers.  You should use approximately 1/3 of the bottle for each shower.   Please read over the following fact sheets that you were given: Pain Booklet, Coughing and Deep Breathing, Blood Transfusion Information, MRSA Information and Surgical Site Infection Prevention

## 2012-12-05 ENCOUNTER — Encounter (HOSPITAL_COMMUNITY)
Admission: RE | Admit: 2012-12-05 | Discharge: 2012-12-05 | Disposition: A | Discharge status: Home / Self Care | Payer: Medicare Other | Source: Ambulatory Visit | Attending: Surgery | Admitting: Surgery

## 2012-12-05 ENCOUNTER — Encounter (HOSPITAL_COMMUNITY): Payer: Self-pay

## 2012-12-05 ENCOUNTER — Ambulatory Visit (HOSPITAL_COMMUNITY)
Admission: RE | Admit: 2012-12-05 | Discharge: 2012-12-05 | Disposition: A | Discharge status: Home / Self Care | Payer: Medicare Other | Source: Ambulatory Visit | Attending: Surgery | Admitting: Surgery

## 2012-12-05 VITALS — BP 149/74 | HR 74 | Temp 97.9°F | Resp 20 | Ht 70.0 in | Wt 197.1 lb

## 2012-12-05 DIAGNOSIS — I10 Essential (primary) hypertension: Secondary | ICD-10-CM | POA: Insufficient documentation

## 2012-12-05 DIAGNOSIS — R222 Localized swelling, mass and lump, trunk: Secondary | ICD-10-CM | POA: Insufficient documentation

## 2012-12-05 DIAGNOSIS — R918 Other nonspecific abnormal finding of lung field: Secondary | ICD-10-CM

## 2012-12-05 DIAGNOSIS — J398 Other specified diseases of upper respiratory tract: Secondary | ICD-10-CM | POA: Insufficient documentation

## 2012-12-05 DIAGNOSIS — Z01812 Encounter for preprocedural laboratory examination: Secondary | ICD-10-CM | POA: Insufficient documentation

## 2012-12-05 DIAGNOSIS — R091 Pleurisy: Secondary | ICD-10-CM | POA: Insufficient documentation

## 2012-12-05 DIAGNOSIS — E785 Hyperlipidemia, unspecified: Secondary | ICD-10-CM | POA: Insufficient documentation

## 2012-12-05 DIAGNOSIS — Z01818 Encounter for other preprocedural examination: Secondary | ICD-10-CM | POA: Insufficient documentation

## 2012-12-05 DIAGNOSIS — R9431 Abnormal electrocardiogram [ECG] [EKG]: Secondary | ICD-10-CM | POA: Insufficient documentation

## 2012-12-05 DIAGNOSIS — Z01810 Encounter for preprocedural cardiovascular examination: Secondary | ICD-10-CM | POA: Insufficient documentation

## 2012-12-05 DIAGNOSIS — E039 Hypothyroidism, unspecified: Secondary | ICD-10-CM | POA: Insufficient documentation

## 2012-12-05 HISTORY — DX: Constipation, unspecified: K59.00

## 2012-12-05 HISTORY — DX: Congenital non-neoplastic nevus: Q82.5

## 2012-12-05 HISTORY — DX: Shortness of breath: R06.02

## 2012-12-05 HISTORY — DX: Pneumonia, unspecified organism: J18.9

## 2012-12-05 HISTORY — DX: Calculus of kidney: N20.0

## 2012-12-05 HISTORY — DX: Unspecified osteoarthritis, unspecified site: M19.90

## 2012-12-05 LAB — COMPREHENSIVE METABOLIC PANEL
ALT: 35 U/L (ref 0–53)
AST: 44 U/L — ABNORMAL HIGH (ref 0–37)
Alkaline Phosphatase: 129 U/L — ABNORMAL HIGH (ref 39–117)
CO2: 23 mEq/L (ref 19–32)
Chloride: 102 mEq/L (ref 96–112)
Creatinine, Ser: 1.09 mg/dL (ref 0.50–1.35)
GFR calc non Af Amer: 62 mL/min — ABNORMAL LOW (ref >90)
Potassium: 4.1 mEq/L (ref 3.5–5.1)
Sodium: 136 mEq/L (ref 135–145)
Total Bilirubin: 0.5 mg/dL (ref 0.3–1.2)

## 2012-12-05 LAB — URINALYSIS, ROUTINE W REFLEX MICROSCOPIC
Glucose, UA: NEGATIVE mg/dL
Hgb urine dipstick: NEGATIVE
Ketones, ur: NEGATIVE mg/dL
Protein, ur: NEGATIVE mg/dL
Urobilinogen, UA: 1 mg/dL (ref 0.0–1.0)

## 2012-12-05 LAB — CBC
MCV: 91.4 fL (ref 78.0–100.0)
Platelets: 139 10*3/uL — ABNORMAL LOW (ref 150–400)
RBC: 4.06 MIL/uL — ABNORMAL LOW (ref 4.22–5.81)
WBC: 8.6 10*3/uL (ref 4.0–10.5)

## 2012-12-05 LAB — TYPE AND SCREEN
ABO/RH(D): A POS
Antibody Screen: NEGATIVE

## 2012-12-05 LAB — BLOOD GAS, ARTERIAL
Acid-Base Excess: 1.2 mmol/L (ref 0.0–2.0)
Drawn by: 344381
pCO2 arterial: 41.1 mmHg (ref 35.0–45.0)
pO2, Arterial: 91.3 mmHg (ref 80.0–100.0)

## 2012-12-05 LAB — PROTIME-INR: INR: 1.06 (ref 0.00–1.49)

## 2012-12-05 LAB — APTT: aPTT: 37 seconds (ref 24–37)

## 2012-12-05 NOTE — Progress Notes (Signed)
Patient informed Nurse that he had a stress test approximately 4 weeks ago with Dr. Jacinto Halim. Will request records. PCP is Dr. [name] and LOV was 2 weeks ago.

## 2012-12-05 NOTE — Progress Notes (Signed)
Patient has screened at risk for obstructive sleep apnea using the STOP Bang tool used during a pre-surgical visit. A score of 4 or greater is at risk for sleep apnea.

## 2012-12-06 MED ORDER — DEXTROSE 5 % IV SOLN
1.5000 g | INTRAVENOUS | Status: AC
  Administered 2012-12-07: 1.5 g via INTRAVENOUS
  Filled 2012-12-06: qty 1.5

## 2012-12-06 NOTE — Progress Notes (Signed)
Anesthesia Chart Review:  Patient is a 79 year old male scheduled for flexible bronchoscopy, left VATS with possible left thoracotomy for LU lobectomy on 12/07/12 by Dr. Laneta Simmers. History includes former smoker, HTN, HLD, hypothyroidism, BPH, abnormal LFTs, anemia, asbestos exposure, arthritis, nephrolithiasis, PNA, gallbladder polyp, cervical disc disease with history of cervical and lumbar surgeries. OSA screening score was 4 or greater. PCP is listed as Dr. [name].  Pulmonary function testing dated 11/28/2012 shows an FEV1 of 1.82 which is 63% of predicted. FVC is 2.84 or 70% of predicted. Corrected diffusion capacity is 53% of predicted. His spirometry improve significantly postbronchodilator with improvement in his FEV1 to 2.1.   EKG on 12/05/12 showed NSR, first degree AVB, LAE, right BBB.    Nuclear stress test on 10/10/12 [facility]) showed normal perfusion without ischemia or scar, normal LVEF 70%.  According to Dr. Verl Dicker notes, patient also had an echo on 08/12/10 that showed normal LVEF, mild diastolic dysfunction, mild to moderate LVH, mild pulmonary HTN.  (No copy of the report was available at [facility].)  Preoperative CXR and labs noted.  Anticipate patient can proceed as planned.  Velna Ochs [facility]/Anesthesiology Phone [phone] 12/06/2012 11:58 AM

## 2012-12-07 ENCOUNTER — Telehealth: Payer: Self-pay | Admitting: *Deleted

## 2012-12-07 ENCOUNTER — Inpatient Hospital Stay (HOSPITAL_COMMUNITY): Payer: Medicare Other | Admitting: Certified Registered Nurse Anesthetist

## 2012-12-07 ENCOUNTER — Encounter (HOSPITAL_COMMUNITY): Payer: Self-pay | Admitting: Surgery

## 2012-12-07 ENCOUNTER — Inpatient Hospital Stay (HOSPITAL_COMMUNITY): Payer: Medicare Other

## 2012-12-07 ENCOUNTER — Inpatient Hospital Stay (HOSPITAL_COMMUNITY)
Admission: RE | Admit: 2012-12-07 | Discharge: 2012-12-12 | DRG: 164 | Disposition: A | Discharge status: Home / Self Care | Payer: Medicare Other | Source: Ambulatory Visit | Attending: Surgery | Admitting: Surgery

## 2012-12-07 ENCOUNTER — Encounter (HOSPITAL_COMMUNITY)
Admission: RE | Disposition: A | Discharge status: Home / Self Care | Payer: Self-pay | Source: Ambulatory Visit | Attending: Surgery

## 2012-12-07 ENCOUNTER — Encounter (HOSPITAL_COMMUNITY): Payer: Self-pay | Admitting: Vascular Surgery

## 2012-12-07 DIAGNOSIS — R339 Retention of urine, unspecified: Secondary | ICD-10-CM | POA: Diagnosis not present

## 2012-12-07 DIAGNOSIS — N401 Enlarged prostate with lower urinary tract symptoms: Secondary | ICD-10-CM | POA: Diagnosis present

## 2012-12-07 DIAGNOSIS — D649 Anemia, unspecified: Secondary | ICD-10-CM | POA: Diagnosis present

## 2012-12-07 DIAGNOSIS — E785 Hyperlipidemia, unspecified: Secondary | ICD-10-CM | POA: Diagnosis present

## 2012-12-07 DIAGNOSIS — R918 Other nonspecific abnormal finding of lung field: Secondary | ICD-10-CM

## 2012-12-07 DIAGNOSIS — E039 Hypothyroidism, unspecified: Secondary | ICD-10-CM | POA: Diagnosis present

## 2012-12-07 DIAGNOSIS — Y838 Other surgical procedures as the cause of abnormal reaction of the patient, or of later complication, without mention of misadventure at the time of the procedure: Secondary | ICD-10-CM | POA: Diagnosis not present

## 2012-12-07 DIAGNOSIS — Z87891 Personal history of nicotine dependence: Secondary | ICD-10-CM

## 2012-12-07 DIAGNOSIS — R634 Abnormal weight loss: Secondary | ICD-10-CM | POA: Diagnosis present

## 2012-12-07 DIAGNOSIS — K929 Disease of digestive system, unspecified: Secondary | ICD-10-CM | POA: Diagnosis not present

## 2012-12-07 DIAGNOSIS — I10 Essential (primary) hypertension: Secondary | ICD-10-CM | POA: Diagnosis present

## 2012-12-07 DIAGNOSIS — N138 Other obstructive and reflux uropathy: Secondary | ICD-10-CM | POA: Diagnosis present

## 2012-12-07 DIAGNOSIS — Z85118 Personal history of other malignant neoplasm of bronchus and lung: Secondary | ICD-10-CM

## 2012-12-07 DIAGNOSIS — C3410 Malignant neoplasm of upper lobe, unspecified bronchus or lung: Secondary | ICD-10-CM

## 2012-12-07 DIAGNOSIS — Z77090 Contact with and (suspected) exposure to asbestos: Secondary | ICD-10-CM

## 2012-12-07 DIAGNOSIS — K560 Paralytic ileus: Secondary | ICD-10-CM | POA: Diagnosis not present

## 2012-12-07 DIAGNOSIS — J9819 Other pulmonary collapse: Secondary | ICD-10-CM | POA: Diagnosis not present

## 2012-12-07 HISTORY — PX: VIDEO ASSISTED THORACOSCOPY (VATS)/THOROCOTOMY: SHX6173

## 2012-12-07 HISTORY — PX: FLEXIBLE BRONCHOSCOPY: SHX5094

## 2012-12-07 SURGERY — BRONCHOSCOPY, FLEXIBLE
Anesthesia: General | Site: Chest | Wound class: Clean Contaminated

## 2012-12-07 MED ORDER — ACETAMINOPHEN 160 MG/5ML PO SOLN
1000.0000 mg | Freq: Four times a day (QID) | ORAL | Status: AC
  Administered 2012-12-07: 1000 mg via ORAL
  Filled 2012-12-07: qty 40.6
  Filled 2012-12-07 (×4): qty 40
  Filled 2012-12-07: qty 40.6

## 2012-12-07 MED ORDER — TAMSULOSIN HCL 0.4 MG PO CAPS
0.4000 mg | ORAL_CAPSULE | Freq: Every day | ORAL | Status: DC
  Administered 2012-12-07 – 2012-12-11 (×5): 0.4 mg via ORAL
  Filled 2012-12-07 (×6): qty 1

## 2012-12-07 MED ORDER — HYDROMORPHONE HCL PF 1 MG/ML IJ SOLN: 0.2500 mg | INTRAMUSCULAR | Status: DC | PRN

## 2012-12-07 MED ORDER — FENTANYL CITRATE 0.05 MG/ML IJ SOLN
INTRAMUSCULAR | Status: DC | PRN
  Administered 2012-12-07 (×2): 50 ug via INTRAVENOUS
  Administered 2012-12-07: 100 ug via INTRAVENOUS
  Administered 2012-12-07: 50 ug via INTRAVENOUS
  Administered 2012-12-07: 100 ug via INTRAVENOUS
  Administered 2012-12-07: 50 ug via INTRAVENOUS

## 2012-12-07 MED ORDER — MIDAZOLAM HCL 2 MG/2ML IJ SOLN
1.0000 mg | INTRAMUSCULAR | Status: AC | PRN
  Administered 2012-12-07: 1 mg via INTRAVENOUS
  Administered 2012-12-07: 0.5 mg via INTRAVENOUS
  Filled 2012-12-07: qty 2

## 2012-12-07 MED ORDER — OXYCODONE HCL 5 MG/5ML PO SOLN: 5.0000 mg | Freq: Once | ORAL | Status: DC | PRN

## 2012-12-07 MED ORDER — OXYCODONE HCL 5 MG PO TABS: 5.0000 mg | ORAL_TABLET | Freq: Once | ORAL | Status: DC | PRN

## 2012-12-07 MED ORDER — GLYCOPYRROLATE 0.2 MG/ML IJ SOLN
INTRAMUSCULAR | Status: DC | PRN
  Administered 2012-12-07 (×3): 0.1 mg via INTRAVENOUS
  Administered 2012-12-07: .8 mg via INTRAVENOUS
  Administered 2012-12-07: 0.1 mg via INTRAVENOUS

## 2012-12-07 MED ORDER — FENTANYL 10 MCG/ML IV SOLN
INTRAVENOUS | Status: DC
  Administered 2012-12-07: 14:00:00 via INTRAVENOUS
  Administered 2012-12-07: 130 ug via INTRAVENOUS
  Administered 2012-12-08: 90 ug via INTRAVENOUS
  Administered 2012-12-08: 0.1 ug via INTRAVENOUS
  Administered 2012-12-08: 50 ug via INTRAVENOUS
  Administered 2012-12-08: 70 ug via INTRAVENOUS
  Administered 2012-12-09: 100 ug via INTRAVENOUS
  Administered 2012-12-09: 13:00:00 via INTRAVENOUS
  Administered 2012-12-09: 80 ug via INTRAVENOUS
  Administered 2012-12-09: 0.5 ug via INTRAVENOUS
  Administered 2012-12-09: 100 ug via INTRAVENOUS
  Administered 2012-12-09: 120 ug via INTRAVENOUS
  Administered 2012-12-10: 10 ug via INTRAVENOUS
  Administered 2012-12-10 (×2): 20 ug via INTRAVENOUS
  Administered 2012-12-10: 100 ug via INTRAVENOUS
  Administered 2012-12-11: 30 ug via INTRAVENOUS
  Filled 2012-12-07 (×3): qty 50

## 2012-12-07 MED ORDER — LIDOCAINE HCL (CARDIAC) 20 MG/ML IV SOLN
INTRAVENOUS | Status: DC | PRN
  Administered 2012-12-07: 100 mg via INTRATRACHEAL

## 2012-12-07 MED ORDER — BISACODYL 5 MG PO TBEC
10.0000 mg | DELAYED_RELEASE_TABLET | Freq: Every day | ORAL | Status: DC
  Administered 2012-12-07 – 2012-12-12 (×6): 10 mg via ORAL
  Filled 2012-12-07 (×5): qty 2

## 2012-12-07 MED ORDER — ONDANSETRON HCL 4 MG/2ML IJ SOLN
INTRAMUSCULAR | Status: DC | PRN
  Administered 2012-12-07: 4 mg via INTRAVENOUS

## 2012-12-07 MED ORDER — ACETAMINOPHEN 500 MG PO TABS
1000.0000 mg | ORAL_TABLET | Freq: Four times a day (QID) | ORAL | Status: AC
  Administered 2012-12-07 – 2012-12-08 (×3): 1000 mg via ORAL
  Filled 2012-12-07 (×5): qty 2

## 2012-12-07 MED ORDER — SODIUM CHLORIDE 0.9 % IJ SOLN: 9.0000 mL | INTRAMUSCULAR | Status: DC | PRN

## 2012-12-07 MED ORDER — ASPIRIN 81 MG PO CHEW
CHEWABLE_TABLET | ORAL | Status: AC
  Filled 2012-12-07: qty 1

## 2012-12-07 MED ORDER — NEOSTIGMINE METHYLSULFATE 1 MG/ML IJ SOLN
INTRAMUSCULAR | Status: DC | PRN
  Administered 2012-12-07: 5 mg via INTRAVENOUS

## 2012-12-07 MED ORDER — HYDROMORPHONE HCL PF 1 MG/ML IJ SOLN
INTRAMUSCULAR | Status: AC
  Filled 2012-12-07: qty 1

## 2012-12-07 MED ORDER — LACTATED RINGERS IV SOLN
INTRAVENOUS | Status: DC | PRN
  Administered 2012-12-07: 09:00:00 via INTRAVENOUS

## 2012-12-07 MED ORDER — EPHEDRINE SULFATE 50 MG/ML IJ SOLN
INTRAMUSCULAR | Status: DC | PRN
  Administered 2012-12-07: 10 mg via INTRAVENOUS

## 2012-12-07 MED ORDER — PHENYLEPHRINE HCL 10 MG/ML IJ SOLN
10.0000 mg | INTRAVENOUS | Status: DC | PRN
  Administered 2012-12-07: 25 ug/min via INTRAVENOUS

## 2012-12-07 MED ORDER — ZOLPIDEM TARTRATE 5 MG PO TABS: 10.0000 mg | ORAL_TABLET | Freq: Every day | ORAL | Status: DC

## 2012-12-07 MED ORDER — ONDANSETRON HCL 4 MG/2ML IJ SOLN
4.0000 mg | Freq: Four times a day (QID) | INTRAMUSCULAR | Status: DC | PRN
  Filled 2012-12-07: qty 2

## 2012-12-07 MED ORDER — SENNOSIDES-DOCUSATE SODIUM 8.6-50 MG PO TABS
1.0000 | ORAL_TABLET | Freq: Every evening | ORAL | Status: DC | PRN
  Filled 2012-12-07: qty 1

## 2012-12-07 MED ORDER — KETOROLAC TROMETHAMINE 30 MG/ML IJ SOLN
INTRAMUSCULAR | Status: AC
  Administered 2012-12-07: 15 mg
  Filled 2012-12-07: qty 1

## 2012-12-07 MED ORDER — DIPHENHYDRAMINE HCL 50 MG/ML IJ SOLN: 12.5000 mg | Freq: Four times a day (QID) | INTRAMUSCULAR | Status: DC | PRN

## 2012-12-07 MED ORDER — NALOXONE HCL 0.4 MG/ML IJ SOLN: 0.4000 mg | INTRAMUSCULAR | Status: DC | PRN

## 2012-12-07 MED ORDER — HYDROMORPHONE HCL PF 1 MG/ML IJ SOLN
0.2500 mg | INTRAMUSCULAR | Status: DC | PRN
  Administered 2012-12-07 (×4): 0.5 mg via INTRAVENOUS

## 2012-12-07 MED ORDER — LACTATED RINGERS IV SOLN
INTRAVENOUS | Status: DC
  Administered 2012-12-07 (×2): via INTRAVENOUS

## 2012-12-07 MED ORDER — POTASSIUM CHLORIDE 10 MEQ/50ML IV SOLN: 10.0000 meq | Freq: Every day | INTRAVENOUS | Status: DC | PRN

## 2012-12-07 MED ORDER — PROPOFOL 10 MG/ML IV BOLUS
INTRAVENOUS | Status: DC | PRN
  Administered 2012-12-07: 140 mg via INTRAVENOUS

## 2012-12-07 MED ORDER — 0.9 % SODIUM CHLORIDE (POUR BTL) OPTIME
TOPICAL | Status: DC | PRN
  Administered 2012-12-07 (×2): 1000 mL

## 2012-12-07 MED ORDER — KCL IN DEXTROSE-NACL 20-5-0.45 MEQ/L-%-% IV SOLN
INTRAVENOUS | Status: DC
  Administered 2012-12-07: 75 mL/h via INTRAVENOUS
  Administered 2012-12-07 – 2012-12-08 (×2): via INTRAVENOUS
  Administered 2012-12-09: 1000 mL via INTRAVENOUS
  Administered 2012-12-10: 18:00:00 via INTRAVENOUS
  Filled 2012-12-07 (×9): qty 1000

## 2012-12-07 MED ORDER — ROCURONIUM BROMIDE 100 MG/10ML IV SOLN
INTRAVENOUS | Status: DC | PRN
  Administered 2012-12-07: 50 mg via INTRAVENOUS

## 2012-12-07 MED ORDER — ASPIRIN EC 81 MG PO TBEC
81.0000 mg | DELAYED_RELEASE_TABLET | Freq: Every day | ORAL | Status: DC
  Administered 2012-12-07 – 2012-12-12 (×6): 81 mg via ORAL
  Filled 2012-12-07 (×6): qty 1

## 2012-12-07 MED ORDER — DEXTROSE 5 % IV SOLN
1.5000 g | Freq: Two times a day (BID) | INTRAVENOUS | Status: AC
  Administered 2012-12-07 – 2012-12-08 (×2): 1.5 g via INTRAVENOUS
  Filled 2012-12-07 (×2): qty 1.5

## 2012-12-07 MED ORDER — KETOROLAC TROMETHAMINE 15 MG/ML IJ SOLN: 15.0000 mg | Freq: Four times a day (QID) | INTRAMUSCULAR | Status: DC | PRN

## 2012-12-07 MED ORDER — LEVALBUTEROL HCL 0.63 MG/3ML IN NEBU
0.6300 mg | INHALATION_SOLUTION | Freq: Four times a day (QID) | RESPIRATORY_TRACT | Status: DC
  Administered 2012-12-07 – 2012-12-08 (×4): 0.63 mg via RESPIRATORY_TRACT
  Filled 2012-12-07 (×8): qty 3

## 2012-12-07 MED ORDER — VECURONIUM BROMIDE 10 MG IV SOLR
INTRAVENOUS | Status: DC | PRN
  Administered 2012-12-07 (×2): 2 mg via INTRAVENOUS

## 2012-12-07 MED ORDER — DIPHENHYDRAMINE HCL 12.5 MG/5ML PO ELIX
12.5000 mg | ORAL_SOLUTION | Freq: Four times a day (QID) | ORAL | Status: DC | PRN
  Filled 2012-12-07: qty 5

## 2012-12-07 MED ORDER — ZOLPIDEM TARTRATE 5 MG PO TABS
5.0000 mg | ORAL_TABLET | Freq: Every day | ORAL | Status: DC
  Administered 2012-12-07 – 2012-12-11 (×5): 5 mg via ORAL
  Filled 2012-12-07 (×6): qty 1

## 2012-12-07 MED ORDER — ONDANSETRON HCL 4 MG/2ML IJ SOLN: 4.0000 mg | Freq: Four times a day (QID) | INTRAMUSCULAR | Status: DC | PRN

## 2012-12-07 MED ORDER — LEVOTHYROXINE SODIUM 125 MCG PO TABS
125.0000 ug | ORAL_TABLET | Freq: Every day | ORAL | Status: DC
  Administered 2012-12-07 – 2012-12-11 (×5): 125 ug via ORAL
  Filled 2012-12-07 (×6): qty 1

## 2012-12-07 MED ORDER — SODIUM CHLORIDE 0.9 % IJ SOLN
INTRAMUSCULAR | Status: AC
  Administered 2012-12-07: 10 mL
  Filled 2012-12-07: qty 10

## 2012-12-07 MED ORDER — ONDANSETRON HCL 4 MG/2ML IJ SOLN
4.0000 mg | Freq: Four times a day (QID) | INTRAMUSCULAR | Status: DC | PRN
  Administered 2012-12-10: 4 mg via INTRAVENOUS
  Filled 2012-12-07 (×2): qty 2

## 2012-12-07 MED ORDER — FENTANYL CITRATE 0.05 MG/ML IJ SOLN
50.0000 ug | INTRAMUSCULAR | Status: DC | PRN
  Administered 2012-12-07: 50 ug via INTRAVENOUS
  Filled 2012-12-07 (×2): qty 2

## 2012-12-07 SURGICAL SUPPLY — 85 items
APPLICATOR TIP EXT COSEAL (VASCULAR PRODUCTS) IMPLANT
APPLIER CLIP ROT 10 11.4 M/L (STAPLE)
BENZOIN TINCTURE PRP APPL 2/3 (GAUZE/BANDAGES/DRESSINGS) IMPLANT
BRUSH CYTOL CELLEBRITY 1.5X140 (MISCELLANEOUS) IMPLANT
CANISTER SUCTION 2500CC (MISCELLANEOUS) ×3 IMPLANT
CATH KIT ON Q 5IN SLV (PAIN MANAGEMENT) IMPLANT
CATH THORACIC 28FR (CATHETERS) IMPLANT
CATH THORACIC 36FR (CATHETERS) IMPLANT
CATH THORACIC 36FR RT ANG (CATHETERS) IMPLANT
CLIP APPLIE ROT 10 11.4 M/L (STAPLE) IMPLANT
CLIP TI MEDIUM 6 (CLIP) ×3 IMPLANT
CLOTH BEACON ORANGE TIMEOUT ST (SAFETY) ×6 IMPLANT
CONN ST 1/4X3/8  BEN (MISCELLANEOUS)
CONN ST 1/4X3/8 BEN (MISCELLANEOUS) IMPLANT
CONN Y 3/8X3/8X3/8  BEN (MISCELLANEOUS)
CONN Y 3/8X3/8X3/8 BEN (MISCELLANEOUS) IMPLANT
CONT SPEC 4OZ CLIKSEAL STRL BL (MISCELLANEOUS) ×6 IMPLANT
COTTONBALL LRG STERILE PKG (GAUZE/BANDAGES/DRESSINGS) IMPLANT
COVER SURGICAL LIGHT HANDLE (MISCELLANEOUS) ×3 IMPLANT
COVER TABLE BACK 60X90 (DRAPES) ×3 IMPLANT
DERMABOND ADVANCED (GAUZE/BANDAGES/DRESSINGS) ×1
DERMABOND ADVANCED .7 DNX12 (GAUZE/BANDAGES/DRESSINGS) ×2 IMPLANT
DRAIN CHANNEL 28F RND 3/8 FF (WOUND CARE) IMPLANT
DRAIN CHANNEL 32F RND 10.7 FF (WOUND CARE) ×3 IMPLANT
DRAPE LAPAROSCOPIC ABDOMINAL (DRAPES) ×3 IMPLANT
DRAPE WARM FLUID 44X44 (DRAPE) ×3 IMPLANT
DRILL BIT 7/64X5 (BIT) IMPLANT
ELECT BLADE 6.5 EXT (BLADE) ×3 IMPLANT
ELECT REM PT RETURN 9FT ADLT (ELECTROSURGICAL) ×3
ELECTRODE REM PT RTRN 9FT ADLT (ELECTROSURGICAL) ×2 IMPLANT
FORCEPS BIOP RJ4 1.8 (CUTTING FORCEPS) IMPLANT
GLOVE BIO SURGEON STRL SZ 6 (GLOVE) ×3 IMPLANT
GLOVE BIOGEL PI IND STRL 6.5 (GLOVE) ×2 IMPLANT
GLOVE BIOGEL PI INDICATOR 6.5 (GLOVE) ×1
GLOVE EUDERMIC 7 POWDERFREE (GLOVE) ×6 IMPLANT
GOWN PREVENTION PLUS XLARGE (GOWN DISPOSABLE) ×3 IMPLANT
GOWN STRL NON-REIN LRG LVL3 (GOWN DISPOSABLE) ×6 IMPLANT
KIT BASIN OR (CUSTOM PROCEDURE TRAY) ×3 IMPLANT
KIT ROOM TURNOVER OR (KITS) ×3 IMPLANT
MARKER SKIN DUAL TIP RULER LAB (MISCELLANEOUS) IMPLANT
NEEDLE 22X1 1/2 (OR ONLY) (NEEDLE) IMPLANT
NEEDLE BIOPSY TRANSBRONCH 21G (NEEDLE) IMPLANT
NS IRRIG 1000ML POUR BTL (IV SOLUTION) ×6 IMPLANT
OIL SILICONE PENTAX (PARTS (SERVICE/REPAIRS)) ×3 IMPLANT
PACK CHEST (CUSTOM PROCEDURE TRAY) ×3 IMPLANT
PAD ARMBOARD 7.5X6 YLW CONV (MISCELLANEOUS) ×9 IMPLANT
SEALANT SURG COSEAL 4ML (VASCULAR PRODUCTS) IMPLANT
SEALANT SURG COSEAL 8ML (VASCULAR PRODUCTS) IMPLANT
SOLUTION ANTI FOG 6CC (MISCELLANEOUS) ×3 IMPLANT
SPONGE GAUZE 4X4 12PLY (GAUZE/BANDAGES/DRESSINGS) ×3 IMPLANT
SUT PROLENE 3 0 SH DA (SUTURE) IMPLANT
SUT PROLENE 4 0 RB 1 (SUTURE)
SUT PROLENE 4-0 RB1 .5 CRCL 36 (SUTURE) IMPLANT
SUT SILK  1 MH (SUTURE) ×2
SUT SILK 1 MH (SUTURE) ×4 IMPLANT
SUT SILK 2 0 SH (SUTURE) IMPLANT
SUT SILK 2 0SH CR/8 30 (SUTURE) ×3 IMPLANT
SUT SILK 3 0SH CR/8 30 (SUTURE) IMPLANT
SUT VIC AB 1 CTX 18 (SUTURE) ×3 IMPLANT
SUT VIC AB 1 CTX 36 (SUTURE) ×1
SUT VIC AB 1 CTX36XBRD ANBCTR (SUTURE) ×2 IMPLANT
SUT VIC AB 2-0 CT1 27 (SUTURE)
SUT VIC AB 2-0 CT1 TAPERPNT 27 (SUTURE) IMPLANT
SUT VIC AB 2-0 CTX 36 (SUTURE) ×3 IMPLANT
SUT VIC AB 2-0 UR6 27 (SUTURE) IMPLANT
SUT VIC AB 3-0 MH 27 (SUTURE) ×3 IMPLANT
SUT VIC AB 3-0 SH 27 (SUTURE) ×1
SUT VIC AB 3-0 SH 27X BRD (SUTURE) ×2 IMPLANT
SUT VIC AB 3-0 X1 27 (SUTURE) ×3 IMPLANT
SUT VICRYL 2 TP 1 (SUTURE) ×3 IMPLANT
SYR 20ML ECCENTRIC (SYRINGE) IMPLANT
SYR 5ML LUER SLIP (SYRINGE) IMPLANT
SYR CONTROL 10ML LL (SYRINGE) IMPLANT
SYSTEM SAHARA CHEST DRAIN ATS (WOUND CARE) IMPLANT
SYSTEM SAHARA CHEST DRAIN RE-I (WOUND CARE) ×3 IMPLANT
TAPE CLOTH 4X10 WHT NS (GAUZE/BANDAGES/DRESSINGS) ×3 IMPLANT
TAPE CLOTH SURG 4X10 WHT LF (GAUZE/BANDAGES/DRESSINGS) ×3 IMPLANT
TIP APPLICATOR SPRAY EXTEND 16 (VASCULAR PRODUCTS) IMPLANT
TOWEL OR 17X24 6PK STRL BLUE (TOWEL DISPOSABLE) ×6 IMPLANT
TOWEL OR 17X26 10 PK STRL BLUE (TOWEL DISPOSABLE) ×6 IMPLANT
TRAP SPECIMEN MUCOUS 40CC (MISCELLANEOUS) IMPLANT
TRAY FOLEY CATH 14FRSI W/METER (CATHETERS) IMPLANT
TUBE CONNECTING 12X1/4 (SUCTIONS) ×3 IMPLANT
TUNNELER SHEATH ON-Q 11GX8 DSP (PAIN MANAGEMENT) IMPLANT
WATER STERILE IRR 1000ML POUR (IV SOLUTION) ×3 IMPLANT

## 2012-12-07 NOTE — Preoperative (Signed)
Beta Blockers   Reason not to administer Beta Blockers:Not Applicable 

## 2012-12-07 NOTE — Progress Notes (Signed)
Dr.Lukens radiologist called CT  OK SVC midline No pneumothorax

## 2012-12-07 NOTE — Brief Op Note (Signed)
12/07/2012  12:23 PM  PATIENT:  Christopher Roach  79 y.o. male  PRE-OPERATIVE DIAGNOSIS:  Left upper lobe mass  POST-OPERATIVE DIAGNOSIS:  Left upper lobe mass  PROCEDURE:  Procedure(s): FLEXIBLE BRONCHOSCOPY (N/A) VIDEO ASSISTED THORACOSCOPY (VATS)/ EXPLORATORY THOROCOTOMY (Left) Biopsy of left upper lobe mass invading mediastinum.  Frozen Section: adenocarcinoma   Findings: left pleural space obliterated by dense adhesions and pleural plaques probably related to prior asbestos exposure. Could not find any free space to do a VATS so small muscle sparing thoracotomy performed and after freeing up left upper lobe enough to evaluate the mass it was invading the mediastinum at AP window and firmly adherent to the pulmonary artery and aorta. Not resectable so biopsy taken.  SURGEON:  Surgeon(s) and Role:    * [name], MD - Primary  PHYSICIAN ASSISTANT: Coral Ceo, PA-C  ASSISTANTS: none   ANESTHESIA:   general  EBL:  Total I/O In: 1500 [I.V.:1500] Out: 425 [Urine:400; Blood:25]  BLOOD ADMINISTERED:none  DRAINS: (1 32 F ) Blake drain(s) in the left pleural space   LOCAL MEDICATIONS USED:  NONE  SPECIMEN:  Source of Specimen:  left upper lobe mass invading mediastinum.  DISPOSITION OF SPECIMEN:  PATHOLOGY  COUNTS:  YES   DICTATION: .Dragon Dictation and Note written in EPIC  PLAN OF CARE: Admit to inpatient   PATIENT DISPOSITION:  PACU - hemodynamically stable.   Delay start of Pharmacological VTE agent (>24hrs) due to surgical blood loss or risk of bleeding: yes

## 2012-12-07 NOTE — Anesthesia Preprocedure Evaluation (Signed)
Anesthesia Evaluation  Patient identified by MRN, date of birth, ID band Patient awake    Reviewed: Allergy & Precautions, H&P , NPO status , Patient's Chart, lab work & pertinent test results  Airway Mallampati: II  Neck ROM: full    Dental   Pulmonary shortness of breath, former smoker,  LUL lung mass         Cardiovascular hypertension,     Neuro/Psych    GI/Hepatic   Endo/Other  Hypothyroidism   Renal/GU      Musculoskeletal   Abdominal   Peds  Hematology   Anesthesia Other Findings   Reproductive/Obstetrics                           Anesthesia Physical Anesthesia Plan  ASA: III  Anesthesia Plan: General   Post-op Pain Management: MAC Combined w/ Regional for Post-op pain   Induction: Intravenous  Airway Management Planned: Double Lumen EBT  Additional Equipment: Arterial line and CVP  Intra-op Plan:   Post-operative Plan: Extubation in OR  Informed Consent: I have reviewed the patients History and Physical, chart, labs and discussed the procedure including the risks, benefits and alternatives for the proposed anesthesia with the patient or authorized representative who has indicated his/her understanding and acceptance.     Plan Discussed with: CRNA, Anesthesiologist and Surgeon  Anesthesia Plan Comments:         Anesthesia Quick Evaluation

## 2012-12-07 NOTE — Anesthesia Procedure Notes (Signed)
Procedure Name: Intubation Date/Time: 12/07/2012 9:58 AM Performed by: Margaree Mackintosh Pre-anesthesia Checklist: Patient identified, Timeout performed, Emergency Drugs available, Suction available and Patient being monitored Patient Re-evaluated:Patient Re-evaluated prior to inductionOxygen Delivery Method: Circle system utilized Preoxygenation: Pre-oxygenation with 100% oxygen Intubation Type: IV induction Ventilation: Mask ventilation without difficulty and Oral airway inserted - appropriate to patient size Laryngoscope Size: Mac and 3 Grade View: Grade I Tube type: Oral Tube size: 8.0 mm Number of attempts: 1 Airway Equipment and Method: Stylet and LTA kit utilized Placement Confirmation: ETT inserted through vocal cords under direct vision,  positive ETCO2 and breath sounds checked- equal and bilateral Secured at: 23 cm Tube secured with: Tape Dental Injury: Teeth and Oropharynx as per pre-operative assessment     Procedure Name: Intubation Date/Time: 12/07/2012 10:15 AM Performed by: Margaree Mackintosh Pre-anesthesia Checklist: Patient identified, Timeout performed, Emergency Drugs available, Suction available and Patient being monitored Patient Re-evaluated:Patient Re-evaluated prior to inductionOxygen Delivery Method: Circle system utilized Preoxygenation: Pre-oxygenation with 100% oxygen Intubation Type: Inhalational induction with existing ETT Laryngoscope Size: Mac and 4 Grade View: Grade I Endobronchial tube: EBT position confirmed by fiberoptic bronchoscope, EBT position confirmed by auscultation and Double lumen EBT and 39 Fr Number of attempts: 1 Airway Equipment and Method: Stylet and Fiberoptic brochoscope Placement Confirmation: ETT inserted through vocal cords under direct vision,  positive ETCO2 and breath sounds checked- equal and bilateral Tube secured with: Tape Dental Injury: Teeth and Oropharynx as per pre-operative assessment

## 2012-12-07 NOTE — Progress Notes (Signed)
PC initiated

## 2012-12-07 NOTE — Anesthesia Postprocedure Evaluation (Signed)
Anesthesia Post Note  Patient: Christopher Roach  Procedure(s) Performed: Procedure(s) (LRB): FLEXIBLE BRONCHOSCOPY (N/A) VIDEO ASSISTED THORACOSCOPY (VATS)/ EXPLORATORY THOROCOTOMY (Left)  Anesthesia type: General  Patient location: PACU  Post pain: Pain level controlled and Adequate analgesia  Post assessment: Post-op Vital signs reviewed, Patient's Cardiovascular Status Stable, Respiratory Function Stable, Patent Airway and Pain level controlled  Last Vitals:  Filed Vitals:   12/07/12 1300  BP: 114/55  Pulse: 56  Temp:   Resp: 11    Post vital signs: Reviewed and stable  Level of consciousness: awake, alert  and oriented  Complications: No apparent anesthesia complications

## 2012-12-07 NOTE — Progress Notes (Signed)
Chest xray obtained

## 2012-12-07 NOTE — H&P (Signed)
[address]       Christopher Roach [address]             [phone]      Cardiothoracic Surgery History and Physical:  HPI:  The patient is an 79 year old prior smoker who presents with a history of recent weight loss and intermittent left chest pain for the past several months. He was referred to cardiology and seen by Dr. Jacinto Halim who did not feel that he had any significant cardiac disease but may have had pneumonia. A CT scan the chest on 10/23/2012 showed an irregular 2.7 x 2.2 x 2.7 cm mass in the medial portion of the left upper lobe adjacent to the aortopulmonary window. There is a slightly prominent lymph node in the aortopulmonary window adjacent to the mass in the left upper lobe. 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 There were no other areas of hypermetabolic activity. It was not possible to tell if this prominent lymph node in the aortopulmonary window was hypermetabolic since it was so close to the tumor. The patient reports that he has continued to have intermittent left-sided chest pain which he says is anteriorly. He has also noticed some intermittent weakness in his voice which he says usually occurs after he coughs up some phlegm but may last for hours.  Past Medical History   Diagnosis  Date   .  Hypertension    .  Hyperlipidemia    .  Hypothyroidism    .  Abnormal LFTs    .  Gallbladder polyp    .  BPH (benign prostatic hyperplasia)    .  Actinic keratosis    .  Lung nodule      benign BX 03/24/2008   .  Anemia    .  H/O asbestos exposure     Past Surgical History   Procedure  Laterality  Date   .  Back surgery       Dr Fannie Knee 1978 and 1982   .  Cervical disc surgery     .  Right foot surgery       plantar fascitis   .  Right shoulder surgery       torn rotator cuff   .  Right ankle fracture         .  Right wrist fracture       Family History   Problem  Relation  Age of Onset   .  Hypertension  Father    .  Heart disease  Father      smoker   .  Alzheimer's disease  Mother    .  Breast cancer  Sister    .  Skin cancer  Brother    .  Heart disease  Son    Social History  History   Substance Use Topics   .  Smoking status:  Former Smoker -- 1.00 packs/day for 40 years     Types:  Cigarettes   .  Smokeless tobacco:  Not on file   .  Alcohol Use:  No    Current Outpatient Prescriptions   Medication  Sig  Dispense  Refill   .  amLODipine (NORVASC) 10 MG tablet  Take 10 mg by mouth daily.     Marland Kitchen  aspirin 81 MG tablet  Take 81 mg by mouth daily.     Marland Kitchen  levothyroxine (SYNTHROID, LEVOTHROID) 125 MCG tablet  Take 125 mcg by mouth daily before breakfast.     .  simvastatin (ZOCOR) 10 MG tablet  Take 10 mg by mouth at bedtime.     .  tamsulosin (FLOMAX) 0.4 MG CAPS  Take 0.4 mg by mouth daily.     Marland Kitchen  triamterene-hydrochlorothiazide (DYAZIDE) 37.5-25 MG per capsule  Take 1 capsule by mouth every morning.     .  vardenafil (LEVITRA) 20 MG tablet  Take 20 mg by mouth daily as needed for erectile dysfunction.     Marland Kitchen  zolpidem (AMBIEN) 10 MG tablet  Take 10 mg by mouth at bedtime as needed for sleep.      No current facility-administered medications for this visit.   No Known Allergies  Review of Systems  Constitutional: Positive for unexpected weight change.  HENT: Positive for voice change.  Eyes: Negative.  Respiratory: Positive for cough.  Cardiovascular: Negative for chest pain and leg swelling.  Gastrointestinal: Negative.  Endocrine: Negative.  Genitourinary: Negative.  Musculoskeletal: Negative for myalgias and arthralgias.  Skin: Negative.  Allergic/Immunologic: Negative.  Neurological: Negative.  Hematological: Negative.  Psychiatric/Behavioral: Negative.  BP 140/73  Pulse 62  Temp(Src) 97.1 F (36.2 C) (Oral)  Resp 18  Ht 5\' 10"  (1.778 m)  Wt 197 lb (89.359 kg)  BMI 28.27 kg/m2  Physical  Exam  Constitutional: He is oriented to person, place, and time. He appears well-developed and well-nourished. No distress.  HENT:  Head: Normocephalic and atraumatic.  Mouth/Throat: Oropharynx is clear and moist.  Voice sounds strong at this time. Does not sound hoarse.  Eyes: Conjunctivae and EOM are normal. Pupils are equal, round, and reactive to light.  Neck: Normal range of motion. Neck supple. No JVD present. No tracheal deviation present. No thyromegaly present.  Cardiovascular: Normal rate, regular rhythm, normal heart sounds and intact distal pulses.  No murmur heard.  Pulmonary/Chest: Effort normal. No respiratory distress. He has no wheezes. He has no rales. He exhibits no tenderness.  Abdominal: Soft. Bowel sounds are normal. He exhibits no distension and no mass. There is no tenderness.  Musculoskeletal: Normal range of motion. He exhibits no edema.  Lymphadenopathy:  He has no cervical adenopathy.  Neurological: He is alert and oriented to person, place, and time. He has normal strength. No cranial nerve deficit or sensory deficit.  Skin: Skin is warm and dry.  Psychiatric: He has a normal mood and affect.  Diagnostic Tests:  *RADIOLOGY REPORT*  Clinical Data: Initial treatment strategy for pulmonary nodule.  NUCLEAR MEDICINE PET SKULL BASE TO THIGH  Fasting Blood Glucose: 89  Technique: 18.7 mCi F-18 FDG was injected intravenously. CT data  was obtained and used for attenuation correction and anatomic  localization only. (This was not acquired as a diagnostic CT  examination.) Additional exam technical data entered on  technologist worksheet.  Comparison: 10/23/2012  Findings:  Neck: No hypermetabolic lymph nodes in the neck.  Chest: Tumor within the paramediastinal left upper lobe is  identified. This measures 2.1 x 3.2 x 3.0 cm and has an SUV max  equal to 21.4. The tumor is abutting the mediastinal fat and may  be invading the AP window region.  There is no  postobstructive consolidation or atelectasis associated  with this mass. Calcified pleural plaques are noted bilaterally  compatible with prior asbestos exposure. There is an area of  rounded atelectasis noted within the left lower lobe, image 110.  Abdomen/Pelvis: No abnormal hypermetabolic activity  within the  liver, pancreas, adrenal glands, or spleen. The liver has a  irregular contour suggestive of cirrhosis. No hypermetabolic liver  lesion identified.No hypermetabolic lymph nodes in the abdomen or  pelvis. There is no significant FDG uptake associated with the low  attenuation nodule in the left lobe of thyroid gland measuring 1.3  cm. Cysts are noted within the right kidney. There is marked  enlargement of the prostate gland.  Skeleton: No focal hypermetabolic activity to suggest skeletal  metastasis.  IMPRESSION:  1. There is malignant range FDG uptake associated with the left  upper lobe paramediastinal tumor. If this is a non-small cell lung  cancer then the stage, based upon the imaging findings, would be  T3N0M0.  2. Prior asbestos exposure.  3. Left lower lobe rounded atelectasis.  4. Nodular contour of the liver. Correlate for any clinical signs  or symptoms of cirrhosis.  Original Report Authenticated By: Signa Kell, M.D.  *RADIOLOGY REPORT*  Clinical Data: Headaches for several months, occasional vertigo.  Also history of lung carcinoma.  CT HEAD WITHOUT CONTRAST  Technique: Contiguous axial images were obtained from the base of  the skull through the vertex without contrast.  Comparison: MR of the brain of 08/16/2008  Findings: The ventricular system is stable in size and  configuration, and some prominence of cortical sulci and cerebellar  folia remains, consistent with diffuse atrophy. The septum is in a  normal midline position. There has been slight progression of  small vessel ischemic change primarily in the periventricular white  matter of the posterior  parietal - occipital regions. No acute  infarction, hemorrhage, or mass lesion is seen. On bone window  images there is some mucosal thickening within right ethmoid air  cells. No air-fluid level is seen. No acute calvarial abnormality  is noted.  IMPRESSION:  1. Some progression of small vessel ischemic change. No acute  intracranial abnormality.  2. Diffuse atrophy.  3. Mild mucosal thickening within right ethmoid air cells  Original Report Authenticated By: Dwyane Dee, M.D.  *RADIOLOGY REPORT*  Clinical Data: Weight loss over several weeks, intermittent left  chest pain for several months, remote smoking history  CT CHEST, ABDOMEN AND PELVIS WITHOUT CONTRAST  Technique: Multidetector CT imaging of the chest, abdomen and  pelvis was performed following the standard protocol without IV  contrast.  Comparison: Ultrasound the abdomen of 03/29/2012  CT CHEST  Findings: On the lung window images, there are mild changes of  centrilobular emphysema present. Within the left lung base  posterolaterally there is an oval opacity of 3.1 x 4.5 cm most  likely representing rounded atelectasis. There is some volume loss  throughout the left hemithorax with pleural thickening and foci of  calcification as well. These changes appear chronic with no  evidence of central endobronchial lesion.  However, within the medial left upper lobe abutting and most likely  involving the left mediastinum there is an irregular mass of 2.7 x  2.2 by 2.7 cm suspicious for primary lung carcinoma. The central  bronchi appear patent, although the bronchi to the left upper lobe  do appear to be somewhat narrowed. Throughout the right lung no  lung nodule or effusion is seen. Minimal peribronchovascular  nodularity is noted the right lower lobe which may indicate an  atypical inflammatory process.  On soft tissue window images, there is a slightly prominent lymph  node in the left superior mediastinum near the left  upper lobe  spiculated lesion of 9 mm  in short axis diameter. Remainder of  mediastinal and hilar nodes are within normal limits in size.  IMPRESSION:  1. Irregularly marginated lesion within the medial left upper lobe  abutting the mediastinum suspicious for primary lung carcinoma.  2. Small adjacent node of 9 mm in short-axis diameter may  represent metastatic involvement.  3. Rounded opacity in the left lower lobe may represent rounded  atelectasis. PET CT would be recommended to assess all of these  areas further.  4. Centrilobular emphysema.  CT ABDOMEN AND PELVIS  Findings: There are a few subtle slightly low attenuation lesions  within the liver on this unenhanced study which may represent  metastatic lesions, one involving the left lobe with the left lobe  being somewhat hypoplastic, and a second involving the  anterolateral right lobe. There is also question of a third lesion  in the inferior right lobe which bulges the contours of the right  lobe, not well assessed on this unenhanced study. In addition to  metastatic involvement, multicentric hepatoma would also be a  consideration. If IV contrast cannot be given then MRI would be  recommended to assess these areas further.  No calcified gallstones are seen. The pancreas is normal in size  and the pancreatic duct is not dilated. The right adrenal glands  unremarkable. There is minimal nodularity of the left adrenal  gland which may represent an incidental adrenal adenoma being  somewhat low in attenuation. The spleen is unremarkable. The  stomach is not well distended with slight prominence of the mucosa  at the GE junction of questionable significance. There is a  calculus in the lower pole of the left collecting system of 4 mm in  diameter. Several low attenuation right renal lesions are noted  most consistent with cysts, difficult to assess well on this  unenhanced study. The abdominal aorta is normal in caliber with    diffuse atheromatous change. No adenopathy is seen.  The prostate is significantly enlarged measuring 6.6 x 6.1 cm,  indenting the posterior inferior urinary bladder. The urinary  bladder is not well distended but is slightly thick-walled. There  are multiple rectosigmoid colonic diverticula noted. The terminal  ileum is unremarkable. However, there is a segment of distal small  bowel that does appear to demonstrate mucosal thickening, and  clinical correlation is recommended. Inflammatory bowel disease  would be difficult to exclude. There is a rounded filling defect  within the ileum on axial image 90 and coronal image 72 measuring 9  mm in diameter. A polypoid lesion cannot be excluded within small  bowel. No evidence of metastatic involvement of the bony skeleton  is seen. The appendix has previously been resected.  IMPRESSION:  1. On this unenhanced study, there is suspicion of several liver  lesions. If IV contrast cannot be given, then MRI would be  recommended to exclude multicentric hepatocellular carcinoma versus  metastatic involvement of the liver.  2. 4 mm nonobstructing left lower pole renal calculus  3. Significantly enlarged prostate.  4. Somewhat thickened mucosa of a distal ileal bowel loop of  questionable significance. Cannot exclude inflammatory bowel  disease.  5. 9 mm polypoid type lesion within the distal small bowel as  well.  Original Report Authenticated By: Dwyane Dee, M.D.  *RADIOLOGY REPORT*  Clinical Data: Weight loss over several weeks, intermittent left  chest pain for several months, remote smoking history  CT CHEST, ABDOMEN AND PELVIS WITHOUT CONTRAST  Technique: Multidetector CT imaging of the chest, abdomen and  pelvis was performed following the standard protocol without IV  contrast.  Comparison: Ultrasound the abdomen of 03/29/2012  CT CHEST  Findings: On the lung window images, there are mild changes of  centrilobular emphysema present.  Within the left lung base  posterolaterally there is an oval opacity of 3.1 x 4.5 cm most  likely representing rounded atelectasis. There is some volume loss  throughout the left hemithorax with pleural thickening and foci of  calcification as well. These changes appear chronic with no  evidence of central endobronchial lesion.  However, within the medial left upper lobe abutting and most likely  involving the left mediastinum there is an irregular mass of 2.7 x  2.2 by 2.7 cm suspicious for primary lung carcinoma. The central  bronchi appear patent, although the bronchi to the left upper lobe  do appear to be somewhat narrowed. Throughout the right lung no  lung nodule or effusion is seen. Minimal peribronchovascular  nodularity is noted the right lower lobe which may indicate an  atypical inflammatory process.  On soft tissue window images, there is a slightly prominent lymph  node in the left superior mediastinum near the left upper lobe  spiculated lesion of 9 mm in short axis diameter. Remainder of  mediastinal and hilar nodes are within normal limits in size.  IMPRESSION:  1. Irregularly marginated lesion within the medial left upper lobe  abutting the mediastinum suspicious for primary lung carcinoma.  2. Small adjacent node of 9 mm in short-axis diameter may  represent metastatic involvement.  3. Rounded opacity in the left lower lobe may represent rounded  atelectasis. PET CT would be recommended to assess all of these  areas further.  4. Centrilobular emphysema.  CT ABDOMEN AND PELVIS  Findings: There are a few subtle slightly low attenuation lesions  within the liver on this unenhanced study which may represent  metastatic lesions, one involving the left lobe with the left lobe  being somewhat hypoplastic, and a second involving the  anterolateral right lobe. There is also question of a third lesion  in the inferior right lobe which bulges the contours of the right  lobe,  not well assessed on this unenhanced study. In addition to  metastatic involvement, multicentric hepatoma would also be a  consideration. If IV contrast cannot be given then MRI would be  recommended to assess these areas further.  No calcified gallstones are seen. The pancreas is normal in size  and the pancreatic duct is not dilated. The right adrenal glands  unremarkable. There is minimal nodularity of the left adrenal  gland which may represent an incidental adrenal adenoma being  somewhat low in attenuation. The spleen is unremarkable. The  stomach is not well distended with slight prominence of the mucosa  at the GE junction of questionable significance. There is a  calculus in the lower pole of the left collecting system of 4 mm in  diameter. Several low attenuation right renal lesions are noted  most consistent with cysts, difficult to assess well on this  unenhanced study. The abdominal aorta is normal in caliber with  diffuse atheromatous change. No adenopathy is seen.  The prostate is significantly enlarged measuring 6.6 x 6.1 cm,  indenting the posterior inferior urinary bladder. The urinary  bladder is not well distended but is slightly thick-walled. There  are multiple rectosigmoid colonic diverticula noted. The terminal  ileum is unremarkable. However, there is a segment of distal small  bowel that does appear to demonstrate  mucosal thickening, and  clinical correlation is recommended. Inflammatory bowel disease  would be difficult to exclude. There is a rounded filling defect  within the ileum on axial image 90 and coronal image 72 measuring 9  mm in diameter. A polypoid lesion cannot be excluded within small  bowel. No evidence of metastatic involvement of the bony skeleton  is seen. The appendix has previously been resected.  IMPRESSION:  1. On this unenhanced study, there is suspicion of several liver  lesions. If IV contrast cannot be given, then MRI would be    recommended to exclude multicentric hepatocellular carcinoma versus  metastatic involvement of the liver.  2. 4 mm nonobstructing left lower pole renal calculus  3. Significantly enlarged prostate.  4. Somewhat thickened mucosa of a distal ileal bowel loop of  questionable significance. Cannot exclude inflammatory bowel  disease.  5. 9 mm polypoid type lesion within the distal small bowel as  well.  Original Report Authenticated By: Dwyane Dee, M.D.  Impression:  He has an irregular 2.7 x 2.2 x 2.7 cm mass in the medial aspect of the left upper lobe adjacent to the mediastinum. This looks like it could be invading the aortopulmonary window. There does not appear to be a clear plane between the mass and the aorta and pulmonary artery. He has had some intermittent weakness in his voice which could indicate that he has some involvement of the left recurrent laryngeal nerve which courses through this region. This could also be due to some phlegm on his vocal cords. There are no other areas of hypermetabolic activity on his PET scan and his CT scan of the brain is negative. He is 82 years but is in excellent shape otherwise and remains active and independent. If his pulmonary function is adequate to tolerate a left upper lobectomy then I think that would be the best option for him. At this point I don't think I can clearly tell if this lesion is resectable since it is right up against the aortopulmonary window and there is no clear plane between the mass and the major vascular structures. I think the best option would be to proceed with thoracoscopy to evaluate this further and if it appears that this mass is not invading the surrounding structures then I would proceed with left upper lobectomy. His PFT's are adequate to tolerate a left upper lobectomy but I would not perform a pneumonectomy in this 79 year old pt. I reviewed all the scans with the patient and his family. All their questions have been  answered. They're in agreement with proceeding. I told him I would plan to perform left VATS to try to determine if this lesion looks resectable before proceeding with thoracotomy. I explained to him that even though it appears resectable by VATS that I may do a thoracotomy and find it is not resectable. I discussed the possibility of lymph node metastasis requiring postoperative chemotherapy. I discussed the alternative of nonsurgical therapy with chemotherapy and radiation therapy. He would like to proceed with surgical therapy. I discussed the operative procedure in detail with him and his family. We discussed alternatives, benefits, and risks including but not limited to bleeding, blood transfusion, injury to lung or major vascular structures, injury to the left recurrent laryngeal nerve with hoarseness, incomplete resection, postoperative bronchial stump complications, prolonged air leak, respiratory failure, and recurrence of cancer. He understands all this and would like to proceed.

## 2012-12-07 NOTE — Transfer of Care (Signed)
Immediate Anesthesia Transfer of Care Note  Patient: Christopher Roach  Procedure(s) Performed: Procedure(s): FLEXIBLE BRONCHOSCOPY (N/A) VIDEO ASSISTED THORACOSCOPY (VATS)/ EXPLORATORY THOROCOTOMY (Left)  Patient Location: PACU  Anesthesia Type:General  Level of Consciousness: awake and alert   Airway & Oxygen Therapy: Maintaining airway, face mask oxygen  Post-op Assessment: Report given to PACU RN and Post -op Vital signs reviewed and stable  Post vital signs: Reviewed and stable  Complications: No apparent anesthesia complications

## 2012-12-07 NOTE — Progress Notes (Signed)
Instructed on PCa using teacj back pt will need reinforcement

## 2012-12-07 NOTE — OR Nursing (Signed)
Flexible Bronchoscopy procedure ended at 1013.

## 2012-12-07 NOTE — Progress Notes (Signed)
Rec'd patient from PACU already in the bed. See health assessment. CT with small air leak

## 2012-12-07 NOTE — Op Note (Signed)
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   Procedure:  1.  Flexible video bronchoscopy 2.  Attempted left video-assisted thoracoscopy 3.  Right muscle-sparing thoracotomy 4.  Biopsy of left upper lobe mass    Anesthesia:  General Endotracheal   Clinical History/Surgical Indication:  The patient is an 79 year old prior smoker who presents with a history of recent weight loss and intermittent left chest pain for the past several months. He was referred to cardiology and seen by Dr. Jacinto Halim who did not feel that he had any significant cardiac disease but may have had pneumonia. A CT scan the chest on 10/23/2012 showed an irregular 2.7 x 2.2 x 2.7 cm mass in the medial portion of the left upper lobe adjacent to the aortopulmonary window. There is a slightly prominent lymph node in the aortopulmonary window adjacent to the mass in the left upper lobe. 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 There were no other areas of hypermetabolic activity. It was not possible to tell if this prominent lymph node in the aortopulmonary window was hypermetabolic since it was so close to the tumor. The patient reports that he has continued to have intermittent left-sided chest pain which he says is anteriorly. He has also noticed some intermittent weakness in his voice which he says usually occurs after he coughs up some phlegm but may last for hours. There are no other areas of hypermetabolic activity on his PET scan and his CT scan of the brain is negative. He is 82 years but is in excellent shape otherwise and  remains active and independent. If his pulmonary function is adequate to tolerate a left upper lobectomy then I think that would be the best option for him. At this point I don't think I can clearly tell if this lesion is resectable since it is right up against the aortopulmonary window and there is no clear plane between the mass and the major vascular structures. I think the best option would be to proceed with thoracoscopy to evaluate this further and if it appears that this mass is not invading the surrounding structures then I would proceed with left upper lobectomy. His PFT's are adequate to tolerate a left upper lobectomy but I would not perform a pneumonectomy in this 79 year old pt. I reviewed all the scans with the patient and his family. All their questions have been answered. They're in agreement with proceeding. I told him I would plan to perform left VATS to try to determine if this lesion looks resectable before proceeding with thoracotomy. I explained to him that even though it appears resectable by VATS that I may do a thoracotomy and find it is not resectable. I discussed the possibility of lymph node metastasis requiring postoperative chemotherapy. I discussed the alternative of nonsurgical therapy with chemotherapy and radiation therapy. He would like to proceed with surgical therapy. I discussed the operative procedure in detail with him and his family. We discussed alternatives, benefits, and risks including but not limited to bleeding, blood transfusion, injury to lung or major vascular structures, injury to the left recurrent laryngeal nerve with hoarseness, incomplete  resection, postoperative bronchial stump complications, prolonged air leak, respiratory failure, and recurrence of cancer. He understands all this and would like to proceed.      Preparation:  The patient was seen in the preoperative holding area and the correct patient, correct operation were confirmed with the patient  after reviewing the medical record and xrays. The left side of the chest was signed by me. The consent was signed by me. Preoperative antibiotics were given. A radial arterial line was placed by the anesthesia team. The patient was taken back to the operating room and positioned supine on the operating room table. After being placed under general endotracheal anesthesia by the anesthesia team using a single lumen tube a foley catheter was placed. Lower extremity SCD's were placed. A time out was taken and the correct patient, operation and operative side were confirmed with nursing and anesthesia staff. Flexible bronchoscopy was performed as noted below. Then the single lumen tube was converted to a double lumen tube and the patient was turned into the right lateral decubitus position with the right side up. The chest was prepped with betadine soap and solution and draped in the usual sterile manner. A surgical time-out was taken and the correct patient and operative procedure and operative side were confirmed with the nursing and anesthesia staff.   Flexible Bronchoscopy:  The video bronchoscope was passed down the endotracheal tube. The distal trachea was normal. The carina was sharp. The left bronchial tree had normal segmental anatomy with no endobronchial lesions or extrinsic compression. The right mainstem was normal. The right bronchial tree had normal segmental anatomy with no endobronchial lesions or extrinsic compression.   Attempted Left Video-Assisted thoracoscopy:  A 1 cm incision was made in the mid-axillary line at the 7th intercostal space and blunt dissection was continued down to the intercostal muscle with a hemostat. The pleural space was entered bluntly and an 8 mm trocar was inserted. The 0-degree thoracoscope was inserted but it was immediately apparent that the pleural space here was obliterated by adhesions. Therefore I tried to enter the pleural space through another small  incision in the anterior axillary line at about the 4th intercostal space. Again the pleural space was obliterated by adhesions. I did not think VATS was a possibility and decided to proceed with exploratory thoracotomy.   Right muscle-sparing thoracotomy:  The chest was entered through a lateral muscle-sparing thoracotomy incision. The pleural space was entered through the 4th ICS. The pleural space was completely obliterated by dense adhesions and the pleura appeared thickened like pleural plaque from asbestos exposure. The upper lobe was mobilized as much as possible anteromedially to expose the mediastinum at the AP window and the hilum of the left lung. This took some time due to the density of the adhesions but I was able to expose this area. The left upper lobe mass was directly invading the AP window and firmly adherant to the left pulmonary artery and the aorta. There were multiple firm lymph nodes palpable in hilum and AP window. I was not able to separate this lung mass from the aorta or PA and therefore did not feel that this was resectable in this patient. A biopsy of the mass was sent to pathology and frozen section showed adenocarcinoma according to Dr. Frederica Kuster. Hemostasis was complete and no air leaks were seen.  Completion:  One 31 F Blake drain was placed in the pleural space posteriorly. The ribs were reapproximated with # 2 vicryl pericostal sutures with the  sutures placed through holes drilled in the 5th rib and placed around the 4th rib. The muscles were returned to their normal anatomic position. The subcutaneous tissue was closed with 2-0 vicryl suture and the skin with 3-0 vicryl subcuticular suture. The sponge, needle, and instrument counts were correct according to the nurses. Dry sterile dressings were applied and the chest tube was connected to pleurevac suction. The patient was turned into the supine position, extubated, and transferred to the PACU in satisfactory and stable  condition.

## 2012-12-07 NOTE — Progress Notes (Signed)
Patient has a red "rash like" in appearance birth mark going up inner left arm and it becomes dark purple on upper left chest. Patient stated it is NOT a rash, it is a birth mark.

## 2012-12-07 NOTE — Progress Notes (Signed)
report given to [facility] Rn for lunch relief

## 2012-12-07 NOTE — Progress Notes (Signed)
DR. Laneta Simmers visited pt informed of findings

## 2012-12-07 NOTE — Telephone Encounter (Signed)
Called and left VM message regarding appt for MTOC 12/13/12 at 3:00 arrive at 2:45

## 2012-12-07 NOTE — Progress Notes (Signed)
Pt 's urine was slightly blood tinged on arrival started in OR surgeon aware , appears more amber at this time

## 2012-12-07 NOTE — Progress Notes (Signed)
bair hugger shut off

## 2012-12-07 NOTE — Interval H&P Note (Signed)
History and Physical Interval Note:  12/07/2012 9:58 AM  Christopher Roach  has presented today for surgery, with the diagnosis of lul lung mass  The various methods of treatment have been discussed with the patient and family. After consideration of risks, benefits and other options for treatment, the patient has consented to  Procedure(s) with comments: FLEXIBLE BRONCHOSCOPY (N/A) VIDEO ASSISTED THORACOSCOPY (VATS)/ LOBECTOMY (Left) - LUL lobectomy  THORACOTOMY MAJOR (Left) as a surgical intervention .  The patient's history has been reviewed, patient examined, no change in status, stable for surgery.  I have reviewed the patient's chart and labs.  Questions were answered to the patient's satisfaction.     [name]

## 2012-12-08 ENCOUNTER — Inpatient Hospital Stay (HOSPITAL_COMMUNITY): Payer: Medicare Other

## 2012-12-08 LAB — BASIC METABOLIC PANEL
CO2: 24 mEq/L (ref 19–32)
Chloride: 100 mEq/L (ref 96–112)
GFR calc Af Amer: 75 mL/min — ABNORMAL LOW (ref >90)
Sodium: 134 mEq/L — ABNORMAL LOW (ref 135–145)

## 2012-12-08 LAB — CBC
HCT: 31.7 % — ABNORMAL LOW (ref 39.0–52.0)
Hemoglobin: 10.7 g/dL — ABNORMAL LOW (ref 13.0–17.0)
MCV: 90.8 fL (ref 78.0–100.0)
RBC: 3.49 MIL/uL — ABNORMAL LOW (ref 4.22–5.81)
WBC: 8 10*3/uL (ref 4.0–10.5)

## 2012-12-08 MED ORDER — SODIUM CHLORIDE 0.9 % IJ SOLN: 10.0000 mL | INTRAMUSCULAR | Status: DC | PRN

## 2012-12-08 MED ORDER — TRAMADOL HCL 50 MG PO TABS: 100.0000 mg | ORAL_TABLET | Freq: Four times a day (QID) | ORAL | Status: DC | PRN

## 2012-12-08 MED ORDER — TRAMADOL HCL 50 MG PO TABS
50.0000 mg | ORAL_TABLET | Freq: Four times a day (QID) | ORAL | Status: DC
  Administered 2012-12-08 – 2012-12-10 (×8): 50 mg via ORAL
  Filled 2012-12-08 (×9): qty 1

## 2012-12-08 MED ORDER — SODIUM CHLORIDE 0.9 % IJ SOLN
10.0000 mL | Freq: Two times a day (BID) | INTRAMUSCULAR | Status: DC
  Filled 2012-12-08: qty 10
  Filled 2012-12-08: qty 30

## 2012-12-08 MED ORDER — LEVALBUTEROL HCL 0.63 MG/3ML IN NEBU: 0.6300 mg | INHALATION_SOLUTION | Freq: Four times a day (QID) | RESPIRATORY_TRACT | Status: DC | PRN

## 2012-12-08 MED ORDER — LEVALBUTEROL HCL 0.63 MG/3ML IN NEBU
0.6300 mg | INHALATION_SOLUTION | Freq: Two times a day (BID) | RESPIRATORY_TRACT | Status: DC
  Administered 2012-12-08 – 2012-12-12 (×8): 0.63 mg via RESPIRATORY_TRACT
  Filled 2012-12-08 (×14): qty 3

## 2012-12-08 NOTE — Progress Notes (Deleted)
Patient arrived to unit via wheel chair. Walked to bedside chair. Patient assessed. Patient has no complains of pain when at rest. Mild pain with coughing or walking. Patient has family at bedside. Vss. Will continue to monitor patient.

## 2012-12-08 NOTE — Progress Notes (Addendum)
[address]       [place] [address]             [phone]          1 Day Post-Op Procedure(s) (LRB): FLEXIBLE BRONCHOSCOPY (N/A) VIDEO ASSISTED THORACOSCOPY (VATS)/ EXPLORATORY THOROCOTOMY (Left)  Subjective: OOB in chair.  Mild nausea overnight, but feels better this am.  Pain with moving around and coughing, but PCA is working.    Objective: Vital signs in last 24 hours: Patient Vitals for the past 24 hrs:  BP Temp Temp src Pulse Resp SpO2 Height Weight  12/08/12 0335 106/48 mmHg 98.2 F (36.8 C) Oral 58 21 93 % - -  12/08/12 0124 - - - - - 94 % - -  12/07/12 2343 - - - - 17 96 % - -  12/07/12 2340 103/50 mmHg 98.3 F (36.8 C) Oral 57 16 97 % - -  12/07/12 2035 119/53 mmHg - - 63 15 97 % - -  12/07/12 2009 - - - - - 96 % - -  12/07/12 2000 - - - - 18 97 % - -  12/07/12 1900 - 98 F (36.7 C) Oral - - - 5\' 10"  (1.778 m) 197 lb 1 oz (89.387 kg)  12/07/12 1855 126/55 mmHg - - 65 15 96 % - -  12/07/12 1615 - - - 68 13 100 % - -  12/07/12 1605 - - - 60 12 98 % - -  12/07/12 1604 - - - 77 13 100 % - -  12/07/12 1603 140/60 mmHg - - 73 12 98 % - -  12/07/12 1600 - - - - 18 100 % - -  12/07/12 1515 114/47 mmHg - - 52 14 100 % - -  12/07/12 1500 131/99 mmHg - - 50 15 100 % - -  12/07/12 1445 111/44 mmHg - - 51 9 100 % - -  12/07/12 1430 119/42 mmHg - - 50 15 100 % - -  12/07/12 1415 118/55 mmHg - - 49 9 100 % - -  12/07/12 1403 - - - - 16 99 % - -  12/07/12 1400 109/47 mmHg 97.4 F (36.3 C) - 58 14 89 % - -  12/07/12 1345 118/57 mmHg - - 51 14 100 % - -  12/07/12 1330 116/50 mmHg - - 53 13 100 % - -  12/07/12 1315 115/47 mmHg - - 48 16 100 % - -  12/07/12 1300 114/55 mmHg 96.5 F (35.8 C) - 56 11 100 % - -  12/07/12 0851 120/67 mmHg - - 65 17 96 % - -  12/07/12 0846 123/56 mmHg - - 63 15 99 % - -  12/07/12 0841 119/57 mmHg - - 58 15 96 % - -  12/07/12 0836 132/63 mmHg - - 58 22 98 % - -  12/07/12 0834 - - - 59 19 98 % - -   Current  Weight  12/07/12 197 lb 1 oz (89.387 kg)     Intake/Output from previous day: 08/29 0701 - 08/30 0700 In: 3295 [P.O.:720; I.V.:2525; IV Piggyback:50] Out: 2150 [Urine:1875; Blood:25; Chest Tube:250]    PHYSICAL EXAM:  Heart: RRR Lungs: Few coarse BS on left Wound: Dressed and dry Chest tube: No air leak with cough, but intermittent 1/7 leak with general respiration    Lab Results: CBC: Recent Labs  12/05/12 1348 12/08/12 0350  WBC 8.6 8.0  HGB 12.5* 10.7*  HCT 37.1* 31.7*  PLT 139* 120*   BMET:  Recent Labs  12/05/12 1348 12/08/12 0350  NA 136 134*  K 4.1 3.9  CL 102 100  CO2 23 24  GLUCOSE 134* 98  BUN 13 14  CREATININE 1.09 1.05  CALCIUM 9.1 8.1*    PT/INR:  Recent Labs  12/05/12 1348  LABPROT 13.6  INR 1.06     CXR: Findings: Left-sided chest tube, extending over the apex. There is  been a modest increase in left chest subcutaneous emphysema. No  evident pneumothorax. Unchanged atelectasis and pleural thickening  at the left base. Mild right basilar atelectasis.  Stable positioning of right IJ approach central venous catheter.  Unchanged upper mediastinal contours. Chronic cardiomegaly.  IMPRESSION:  1. No definitive pneumothorax, although left chest subcutaneous  emphysema has slightly increased. Left chest tube in stable  position.  2. Left more than right base atelectasis, similar yesterday.   Assessment/Plan: S/P Procedure(s) (LRB): FLEXIBLE BRONCHOSCOPY (N/A) VIDEO ASSISTED THORACOSCOPY (VATS)/ EXPLORATORY THOROCOTOMY (Left)  CT output low, no ptx on CXR, but small amount SQ air.  Will continue CT to suction for now.  BPs overnight have been stable, but are trending up this am, likely due to pain.  Will watch and resume low dose Norvasc as needed.  Pain controlled with PCA, Toradol, but no po meds ordered. Will Rx Ultram.  Advance diet as tolerated, leave Foley one more day until more mobile (Flomax restarted), d/c a-line,mobilize  as tolerated, continue pulm toilet.    LOS: 1 day    COLLINS,GINA H 12/08/2012  ADDENDUM : Central line is basically out.  Will have peripheral line placed, then d/c.   I have seen and examined the patient and agree with the assessment and plan as outlined.  OWEN,CLARENCE H 12/08/2012 12:16 PM

## 2012-12-09 ENCOUNTER — Inpatient Hospital Stay (HOSPITAL_COMMUNITY): Payer: Medicare Other

## 2012-12-09 LAB — CBC
Hemoglobin: 12.9 g/dL — ABNORMAL LOW (ref 13.0–17.0)
MCH: 30.7 pg (ref 26.0–34.0)
MCV: 91.2 fL (ref 78.0–100.0)
RBC: 4.2 MIL/uL — ABNORMAL LOW (ref 4.22–5.81)
WBC: 9.5 10*3/uL (ref 4.0–10.5)

## 2012-12-09 LAB — BASIC METABOLIC PANEL
CO2: 27 mEq/L (ref 19–32)
GFR calc non Af Amer: 68 mL/min — ABNORMAL LOW (ref >90)
Glucose, Bld: 123 mg/dL — ABNORMAL HIGH (ref 70–99)
Potassium: 4.5 mEq/L (ref 3.5–5.1)
Sodium: 135 mEq/L (ref 135–145)

## 2012-12-09 MED ORDER — AMLODIPINE BESYLATE 5 MG PO TABS
5.0000 mg | ORAL_TABLET | Freq: Every day | ORAL | Status: DC
  Administered 2012-12-09 – 2012-12-12 (×4): 5 mg via ORAL
  Filled 2012-12-09 (×4): qty 1

## 2012-12-09 NOTE — Progress Notes (Signed)
Pt hasn't voided since 1100 hrs today bladder scanned pt which showed  320 plus mls of urine , encouraged pt to void could only obtain 75 cc so straight cath performed per policy and protocol returned 300 cc of amber colored urine, pt tolerated well will continue to monitor

## 2012-12-09 NOTE — Progress Notes (Addendum)
       [address]       [place] [address]             [phone]          2 Days Post-Op Procedure(s) (LRB): FLEXIBLE BRONCHOSCOPY (N/A) VIDEO ASSISTED THORACOSCOPY (VATS)/ EXPLORATORY THOROCOTOMY (Left)  Subjective: Sore, just walked around unit. Breathing stable, some cough.   Objective: Vital signs in last 24 hours: Patient Vitals for the past 24 hrs:  BP Temp Temp src Pulse Resp SpO2  12/09/12 0418 130/56 mmHg 98.3 F (36.8 C) Oral 64 18 93 %  12/09/12 0359 - - - - 15 92 %  12/08/12 2346 128/56 mmHg 98.7 F (37.1 C) Oral 64 14 93 %  12/08/12 2105 - - - 62 20 96 %  12/08/12 2028 117/57 mmHg 97.4 F (36.3 C) Oral 93 23 93 %  12/08/12 2000 - - - - 18 94 %  12/08/12 1600 - 98.3 F (36.8 C) Oral - 17 95 %  12/08/12 1200 - - - - 20 96 %  12/08/12 1124 121/50 mmHg 98.7 F (37.1 C) Oral 62 18 94 %  12/08/12 1009 - - - - 20 95 %  12/08/12 0922 - - - - - 95 %  12/08/12 0900 - - - 67 21 94 %   Current Weight  12/07/12 197 lb 1 oz (89.387 kg)     Intake/Output from previous day: 08/30 0701 - 08/31 0700 In: 1140 [P.O.:240; I.V.:900] Out: 6120 [Urine:6050; Chest Tube:70]    PHYSICAL EXAM:  Heart: RRR Lungs: Few coarse BS bilaterally Wound: Dressed and dry Chest tube: 1/7 air leak     Lab Results: CBC: Recent Labs  12/08/12 0350 12/09/12 0350  WBC 8.0 9.5  HGB 10.7* 12.9*  HCT 31.7* 38.3*  PLT 120* 129*   BMET:  Recent Labs  12/08/12 0350 12/09/12 0350  NA 134* 135  K 3.9 4.5  CL 100 99  CO2 24 27  GLUCOSE 98 123*  BUN 14 9  CREATININE 1.05 1.01  CALCIUM 8.1* 8.8    PT/INR: No results found for this basename: LABPROT, INR,  in the last 72 hours  CXR: stable area of SQ air, no obvious ptx  Assessment/Plan: S/P Procedure(s) (LRB): FLEXIBLE BRONCHOSCOPY (N/A) VIDEO ASSISTED THORACOSCOPY (VATS)/ EXPLORATORY THOROCOTOMY (Left) CT with small, stable air leak, CXR stable.  Continue CT to suction.  May be able to place  to water seal soon. HTN- BPs trending up.  Will resume low dose Norvasc. Continue ambulation, pulm toilet.   LOS: 2 days    COLLINS,GINA H 12/09/2012  I have seen and examined the patient and agree with the assessment and plan as outlined.  Chest tubes to water seal.  Verlia Kaney H 12/09/2012 11:44 AM

## 2012-12-09 NOTE — Progress Notes (Signed)
Patient bladder scanned = . Patient has no stated need to void.

## 2012-12-10 ENCOUNTER — Inpatient Hospital Stay (HOSPITAL_COMMUNITY): Payer: Medicare Other

## 2012-12-10 LAB — BASIC METABOLIC PANEL
CO2: 26 mEq/L (ref 19–32)
Calcium: 9.2 mg/dL (ref 8.4–10.5)
Chloride: 95 mEq/L — ABNORMAL LOW (ref 96–112)
Glucose, Bld: 153 mg/dL — ABNORMAL HIGH (ref 70–99)
Sodium: 131 mEq/L — ABNORMAL LOW (ref 135–145)

## 2012-12-10 MED ORDER — BISACODYL 10 MG RE SUPP
10.0000 mg | Freq: Every day | RECTAL | Status: DC | PRN
  Administered 2012-12-10: 10 mg via RECTAL
  Filled 2012-12-10: qty 1

## 2012-12-10 MED ORDER — METOCLOPRAMIDE HCL 5 MG/ML IJ SOLN
10.0000 mg | Freq: Four times a day (QID) | INTRAMUSCULAR | Status: AC
  Administered 2012-12-10 – 2012-12-11 (×4): 10 mg via INTRAVENOUS
  Filled 2012-12-10 (×4): qty 2

## 2012-12-10 MED ORDER — MAGNESIUM HYDROXIDE 400 MG/5ML PO SUSP
30.0000 mL | Freq: Every day | ORAL | Status: DC | PRN
  Administered 2012-12-10: 30 mL via ORAL
  Filled 2012-12-10: qty 30

## 2012-12-10 NOTE — Progress Notes (Signed)
Bladder scanned pt who hadn't voided since bing straight catheterized  At 2130 bladder scan only showed 157 mls per protocol will wait 2 hours and re scan

## 2012-12-10 NOTE — Progress Notes (Signed)
Pt vomitted 350cc of digested food.

## 2012-12-10 NOTE — Progress Notes (Addendum)
       [address]       [place] [address]             [phone]          3 Days Post-Op Procedure(s) (LRB): FLEXIBLE BRONCHOSCOPY (N/A) VIDEO ASSISTED THORACOSCOPY (VATS)/ EXPLORATORY THOROCOTOMY (Left)  Subjective: Had a rough night.  Became nauseated and vomited a large amount early this am. Nausea now improved, but still not feeling great.  +flatus, no BM yet.  Breathing stable.  Also, had voiding issues- I/o cathed overnight without much return. Bladder scan this am showed only 150 ml.   Objective: Vital signs in last 24 hours: Patient Vitals for the past 24 hrs:  BP Temp Temp src Pulse Resp SpO2  12/10/12 0409 109/68 mmHg 98.4 F (36.9 C) Oral 76 15 96 %  12/10/12 0001 114/70 mmHg 98.5 F (36.9 C) Oral 72 17 96 %  12/09/12 2002 - - - 72 15 95 %  12/09/12 1927 125/56 mmHg 98.3 F (36.8 C) Oral 72 13 96 %  12/09/12 1642 - 97.3 F (36.3 C) Oral - 16 98 %  12/09/12 1534 116/64 mmHg - - 65 19 95 %  12/09/12 1318 - - - - 16 96 %  12/09/12 1207 - 97.9 F (36.6 C) Oral - - -  12/09/12 1200 - - - - 22 90 %  12/09/12 1131 119/67 mmHg - - 71 11 91 %  12/09/12 1100 - - - - 12 96 %  12/09/12 0814 120/56 mmHg - - 59 16 94 %  12/09/12 0800 - 98.1 F (36.7 C) Oral - 20 90 %   Current Weight  12/07/12 197 lb 1 oz (89.387 kg)     Intake/Output from previous day: 08/31 0701 - 09/01 0700 In: 809.8 [P.O.:240; I.V.:569.8] Out: 825 [Urine:525; Emesis/NG output:300]    PHYSICAL EXAM:  Heart: RRR Lungs: Few coarse BS bilaterally Wound: Clean and dry Abdomen: soft, mildly distended, BS hypoactive Chest tube: No air leak    Lab Results: CBC: Recent Labs  12/08/12 0350 12/09/12 0350  WBC 8.0 9.5  HGB 10.7* 12.9*  HCT 31.7* 38.3*  PLT 120* 129*   BMET:  Recent Labs  12/08/12 0350 12/09/12 0350  NA 134* 135  K 3.9 4.5  CL 100 99  CO2 24 27  GLUCOSE 98 123*  BUN 14 9  CREATININE 1.05 1.01  CALCIUM 8.1* 8.8    PT/INR: No results  found for this basename: LABPROT, INR,  in the last 72 hours  CXR: stable, no obvious ptx  Assessment/Plan: S/P Procedure(s) (LRB): FLEXIBLE BRONCHOSCOPY (N/A) VIDEO ASSISTED THORACOSCOPY (VATS)/ EXPLORATORY THOROCOTOMY (Left) CT with no air leak, CXR stable.  Hopefully can d/c CT soon. GI- possible early ileus.  Will try LOC since he is passing gas, add Reglan for nausea.  Increase IVF to 50 ml/hr until he is eating better. GU- back on Flomax, had previously been voiding without problem.  Will watch.  Mobilize as tolerated, pulm toilet.   LOS: 3 days    COLLINS,GINA H 12/10/2012  I have seen and examined the patient and agree with the assessment and plan as outlined.  D/C chest tube.  Appears to have significant ileus - will put diet back to clear liquids.  Check KUB in am.  Rashon Rezek H 12/10/2012 10:24 AM

## 2012-12-10 NOTE — Progress Notes (Signed)
Bladder scan for only 217cc. Will continue to monitor.

## 2012-12-11 ENCOUNTER — Telehealth: Payer: Self-pay | Admitting: Internal Medicine

## 2012-12-11 ENCOUNTER — Inpatient Hospital Stay (HOSPITAL_COMMUNITY): Payer: Medicare Other

## 2012-12-11 ENCOUNTER — Encounter: Payer: Self-pay | Admitting: *Deleted

## 2012-12-11 ENCOUNTER — Encounter (HOSPITAL_COMMUNITY): Payer: Self-pay | Admitting: Surgery

## 2012-12-11 LAB — BASIC METABOLIC PANEL
CO2: 28 mEq/L (ref 19–32)
Calcium: 8.7 mg/dL (ref 8.4–10.5)
Creatinine, Ser: 1.25 mg/dL (ref 0.50–1.35)
Glucose, Bld: 125 mg/dL — ABNORMAL HIGH (ref 70–99)

## 2012-12-11 LAB — CBC
MCH: 30.7 pg (ref 26.0–34.0)
MCV: 90.4 fL (ref 78.0–100.0)
Platelets: 171 10*3/uL (ref 150–400)
RDW: 14 % (ref 11.5–15.5)

## 2012-12-11 MED ORDER — ACETAMINOPHEN 160 MG/5ML PO SOLN
650.0000 mg | ORAL | Status: DC
  Administered 2012-12-11 – 2012-12-12 (×5): 650 mg via ORAL
  Filled 2012-12-11 (×7): qty 20.3

## 2012-12-11 MED ORDER — TRAMADOL 5 MG/ML ORAL SUSPENSION
50.0000 mg | Freq: Four times a day (QID) | ORAL | Status: DC
  Filled 2012-12-11 (×5): qty 10

## 2012-12-11 NOTE — Progress Notes (Signed)
Pt daughter, Clois Dupes called.  She stated Dr. Laneta Simmers asked her to cancel this weeks MTOC appt and to reschedule.  I will re-schedule.

## 2012-12-11 NOTE — Telephone Encounter (Signed)
C/D 12/11/12 for appt. 12/13/12 °

## 2012-12-11 NOTE — Progress Notes (Signed)
      [address]       [place] [address]             [phone]      4 Days Post-Op Procedure(s) (LRB): FLEXIBLE BRONCHOSCOPY (N/A) VIDEO ASSISTED THORACOSCOPY (VATS)/ EXPLORATORY THOROCOTOMY (Left)  Subjective:  Christopher Roach is feeling much better this morning.  He has had no further episodes of vomiting.  His bowels have moved twice and he is passing gas.  His urinary retention has also improved.   Objective: Vital signs in last 24 hours: Temp:  [98.2 F (36.8 C)-99.2 F (37.3 C)] 98.8 F (37.1 C) (09/02 0800) Pulse Rate:  [61-77] 69 (09/02 0800) Cardiac Rhythm:  [-] Normal sinus rhythm;Heart block (09/02 0350) Resp:  [16-22] 19 (09/02 0800) BP: (111-126)/(57-60) 118/59 mmHg (09/02 0800) SpO2:  [91 %-96 %] 94 % (09/02 0800) FiO2 (%):  [96 %] 96 % (09/02 0749)  Intake/Output from previous day: 09/01 0701 - 09/02 0700 In: 2215.8 [P.O.:540; I.V.:1675.8] Out: 1252 [Urine:752; Emesis/NG output:500] Intake/Output this shift: Total I/O In: -  Out: 150 [Urine:150]  General appearance: alert, cooperative and no distress Heart: regular rate and rhythm Lungs: clear to auscultation bilaterally Abdomen: soft, non-tender; bowel sounds normal; no masses,  no organomegaly Extremities: extremities normal, atraumatic, no cyanosis or edema Wound: clean and dry  Lab Results:  Recent Labs  12/09/12 0350 12/11/12 0505  WBC 9.5 8.0  HGB 12.9* 11.5*  HCT 38.3* 33.8*  PLT 129* 171   BMET:  Recent Labs  12/10/12 0850 12/11/12 0505  NA 131* 132*  K 5.2* 4.9  CL 95* 97  CO2 26 28  GLUCOSE 153* 125*  BUN 19 22  CREATININE 1.38* 1.25  CALCIUM 9.2 8.7    PT/INR: No results found for this basename: LABPROT, INR,  in the last 72 hours ABG    Component Value Date/Time   PHART 7.406 12/05/2012 1348   HCO3 25.4* 12/05/2012 1348   TCO2 26.6 12/05/2012 1348   O2SAT 98.3 12/05/2012 1348   CBG (last 3)  No results found for this basename: GLUCAP,  in the last  72 hours  Assessment/Plan: S/P Procedure(s) (LRB): FLEXIBLE BRONCHOSCOPY (N/A) VIDEO ASSISTED THORACOSCOPY (VATS)/ EXPLORATORY THOROCOTOMY (Left)  1. CV- NSR rate and pressure controlled, continue Norvasc 2. Pulm- no acute issues, encouraged use of IS 3. GI- + Ileus, improving, continue clear liquids today 4. GU- urinary retention improved, will continue Flomax 5. Dispo- patient stable, continue current care for Ileus, will d/c PCA    LOS: 4 days    Raford Pitcher, Denny Peon 12/11/2012

## 2012-12-11 NOTE — Progress Notes (Signed)
4 Days Post-Op Procedure(s) (LRB): FLEXIBLE BRONCHOSCOPY (N/A) VIDEO ASSISTED THORACOSCOPY (VATS)/ EXPLORATORY THOROCOTOMY (Left) Subjective: Feels better. Taking some clear liquids this am. Had 2 BM's yesterday. Passing flatus. Walked this am.  Objective: Vital signs in last 24 hours: Temp:  [98.2 F (36.8 C)-99.2 F (37.3 C)] 98.8 F (37.1 C) (09/02 0800) Pulse Rate:  [61-77] 69 (09/02 0800) Cardiac Rhythm:  [-] Normal sinus rhythm;Heart block (09/02 0350) Resp:  [16-22] 19 (09/02 0800) BP: (111-126)/(57-60) 118/59 mmHg (09/02 0800) SpO2:  [91 %-96 %] 94 % (09/02 0800) FiO2 (%):  [96 %] 96 % (09/02 0749)  Hemodynamic parameters for last 24 hours:    Intake/Output from previous day: 09/01 0701 - 09/02 0700 In: 2215.8 [P.O.:540; I.V.:1675.8] Out: 1252 [Urine:752; Emesis/NG output:500] Intake/Output this shift: Total I/O In: -  Out: 150 [Urine:150]  General appearance: alert and cooperative Heart: regular rate and rhythm, S1, S2 normal, no murmur, click, rub or gallop Lungs: clear to auscultation bilaterally Abdomen: soft, bowel sounds present, mildly distended but reportedly much better. Wound: incision ok  Lab Results:  Recent Labs  12/09/12 0350 12/11/12 0505  WBC 9.5 8.0  HGB 12.9* 11.5*  HCT 38.3* 33.8*  PLT 129* 171   BMET:  Recent Labs  12/10/12 0850 12/11/12 0505  NA 131* 132*  K 5.2* 4.9  CL 95* 97  CO2 26 28  GLUCOSE 153* 125*  BUN 19 22  CREATININE 1.38* 1.25  CALCIUM 9.2 8.7    PT/INR: No results found for this basename: LABPROT, INR,  in the last 72 hours ABG    Component Value Date/Time   PHART 7.406 12/05/2012 1348   HCO3 25.4* 12/05/2012 1348   TCO2 26.6 12/05/2012 1348   O2SAT 98.3 12/05/2012 1348   CBG (last 3)  No results found for this basename: GLUCAP,  in the last 72 hours  *RADIOLOGY REPORT*   Clinical Data: Post left sided vats, evaluate for pneumothorax   CHEST - 2 VIEW   Comparison: 12/10/2012; 12/09/2012;  12/08/2012   Findings:   Grossly unchanged cardiac silhouette and mediastinal contours with mild tortuosity of the thoracic aorta.  There is persistent mild rightward tracheal deviation at the level of the aortic arch.   The lungs remain hyperexpanded.  Interval removal left sided chest tube with development of a tiny loculated left basilar hydropneumothorax.  No change to slight increase in amount of left lateral chest wall subcutaneous emphysema.  Left basilar heterogeneous opacity are grossly unchanged.  No new focal airspace opacity. No definite evidence of edema.  No definite pleural effusion.  Unchanged bones.  A surgical clip overlies the left lower neck.   IMPRESSION: Interval removal of left-sided chest tube with development of a tiny left basilar hydropneumothorax.     Original Report Authenticated By: Tacey Ruiz, MD   *RADIOLOGY REPORT*   Clinical Data: Abdominal distension, evaluate for ileus   ABDOMEN - 2 VIEW   Comparison: PET CT - 11/06/2012; chest radiograph of earlier same day   Findings:   There is mild gaseous distension of several loops of small bowel within this loop within the left mid abdomen measuring approximately 3.7 cm in diameter.  This finding is associated with a paucity of bowel gas with small amount of air is seen within the hepatic flexure of the colon.  No definite pneumoperitoneum, pneumatosis or portal venous gas.   Limited visualization of the lower thorax demonstrates a residual tiny left basilar hydropneumothorax post recent left-sided chest tube removal.  There is a persistent small likely partially locular left-sided effusion.  There is a minimal amount of residual subcutaneous emphysema about the inferior left lateral chest wall.   Multilevel thoracolumbar are spine degenerative change.   IMPRESSION: 1. Nonspecific mild gaseous distension of several loops of small bowel with overall paucity of distal colonic gas.  While  possibly representative of ileus, developing partial small bowel obstruction is not excluded.  Continued attention on follow up may be performed as clinically indicated. 2.  Limited visualization of the lower thorax demonstrates a persistent trace left-sided hydropneumothorax post recent chest tube removal.     Original Report Authenticated By: Tacey Ruiz, MD   Assessment/Plan: S/P Procedure(s) (LRB): FLEXIBLE BRONCHOSCOPY (N/A) VIDEO ASSISTED THORACOSCOPY (VATS)/ EXPLORATORY THOROCOTOMY (Left)  Postop ileus: seems to be improving. Continue clear liquids and observe. Decrease IVF. Will continue fentanyl PCA today and switch to po dilaudid tomorrow if he continues to take po well.  IS and ambulation  Home when ileus resolves.  He will need to followup in [facility] clinic and I will need to see him in the office in 2 weeks to check incision and remove chest tube suture.   LOS: 4 days    Timiya Howells K 12/11/2012

## 2012-12-11 NOTE — Progress Notes (Signed)
Utilization review completed.  

## 2012-12-11 NOTE — Progress Notes (Signed)
PCA D/c'd per order. of fentanyl wasted. Witnessed by S. Love RN

## 2012-12-12 MED ORDER — ACETAMINOPHEN 325 MG PO TABS: 650.0000 mg | ORAL_TABLET | Freq: Four times a day (QID) | ORAL | Status: DC | PRN

## 2012-12-12 NOTE — Progress Notes (Addendum)
       [address]       [place] [address]             [phone]          5 Days Post-Op Procedure(s) (LRB): FLEXIBLE BRONCHOSCOPY (N/A) VIDEO ASSISTED THORACOSCOPY (VATS)/ EXPLORATORY THOROCOTOMY (Left)  Subjective: "I feel 100% better."  Ate a small amount for dinner last night, denies nausea.  +Large BM.  No other complaints. Hoping to go home today.   Objective: Vital signs in last 24 hours: Patient Vitals for the past 24 hrs:  BP Temp Temp src Pulse Resp SpO2  12/12/12 0729 111/56 mmHg 99.1 F (37.3 C) Oral 70 22 93 %  12/12/12 0423 115/52 mmHg 98.7 F (37.1 C) Oral 69 20 96 %  12/11/12 2331 103/55 mmHg 98.6 F (37 C) Oral 62 15 95 %  12/11/12 2055 - - - 69 22 97 %  12/11/12 1912 135/56 mmHg 98.6 F (37 C) Oral 73 20 96 %  12/11/12 1554 - 98.3 F (36.8 C) Oral - - -  12/11/12 1553 119/58 mmHg - - 63 22 96 %  12/11/12 1108 - 99 F (37.2 C) Oral - - -  12/11/12 1107 130/58 mmHg - - 75 19 96 %  12/11/12 1026 119/62 mmHg - - 70 23 93 %  12/11/12 1024 119/62 mmHg - - - - -  12/11/12 0925 - - - - - 95 %  12/11/12 0800 118/59 mmHg 98.8 F (37.1 C) Oral 69 19 94 %  12/11/12 0749 - - - - 16 96 %  12/11/12 0748 - - - - 20 94 %   Current Weight  12/07/12 197 lb 1 oz (89.387 kg)     Intake/Output from previous day: 09/02 0701 - 09/03 0700 In: 574.6 [P.O.:360; I.V.:214.6] Out: 1302 [Urine:1300; Stool:2]    PHYSICAL EXAM:  Heart: RRR Lungs: Clear Wound: Clean and dry Abdomen: soft, nt/nd, +BS    Lab Results: CBC: Recent Labs  12/11/12 0505  WBC 8.0  HGB 11.5*  HCT 33.8*  PLT 171   BMET:  Recent Labs  12/10/12 0850 12/11/12 0505  NA 131* 132*  K 5.2* 4.9  CL 95* 97  CO2 26 28  GLUCOSE 153* 125*  BUN 19 22  CREATININE 1.38* 1.25  CALCIUM 9.2 8.7    PT/INR: No results found for this basename: LABPROT, INR,  in the last 72 hours  No new x-rays    Assessment/Plan: S/P Procedure(s) (LRB): FLEXIBLE BRONCHOSCOPY  (N/A) VIDEO ASSISTED THORACOSCOPY (VATS)/ EXPLORATORY THOROCOTOMY (Left) Doing well from surgical standpoint. Ileus- resolving. He tolerated regular diet last pm, but didn't eat much.  Will see how he does with breakfast. Hopefully home later this am if no changes.   LOS: 5 days    COLLINS,GINA H 12/12/2012   Chart reviewed, patient examined, agree with above. He looks good. Ileus is resolved and he is eating well this am. He can go home today. I want to see him back in a week to remove the chest tube suture and check a cxr.

## 2012-12-12 NOTE — Progress Notes (Signed)
Pt discharged to home with daugther via volunteer w/c with belongings. Discharge instructions discussed and explained, exit care note given on after care for thorocatomy and f/u appt.  No prescriptions order just tylenol and ibuprofen ordered for pain. No c/o pain at this time.

## 2012-12-12 NOTE — Progress Notes (Signed)
IV accidentally removed by patient. Dr. Laneta Simmers at bedside and aware, okay to not have IV access at this time. Will continue to monitor.   Rochele Pages, RN

## 2012-12-12 NOTE — Discharge Summary (Signed)
[address]       [place] [address]             [phone]              Discharge Summary  Name: Christopher Roach DOB: [birth year] 79 y.o. MRN: [id number]   Admission Date: 12/07/2012 Discharge Date: 12/12/2012    Admitting Diagnosis: Left upper lobe lung mass   Discharge Diagnosis:  Invasive adenocarcinoma, left upper lobe Postoperative ileus, resolved  Past Medical History  Diagnosis Date  . Hypertension   . Hyperlipidemia   . Hypothyroidism   . Abnormal LFTs   . Gallbladder polyp   . BPH (benign prostatic hyperplasia)   . Actinic keratosis   . Lung nodule     benign BX 03/24/2008  . Anemia   . H/O asbestos exposure   . Pneumonia     hx of walking PNA  . Shortness of breath     with exertion  . Constipation     from medications  . Arthritis   . Kidney stone August 2014    hx of  . Birth mark     left arm and shoulder; Dark purple on upper L chest ;NOT A RASH     Procedures: FLEXIBLE BRONCHOSCOPY ATTEMPTED LEFT VIDEO ASSISTED THORACOSCOPY EXPLORATORY LEFT THORACOTOMY, BIOPSY OF LEFT UPPER LOBE MASS - 12/07/2012    HPI:  The patient is a 79 y.o. male prior smoker who presents with a history of recent weight loss and intermittent left chest pain for the past several months. He was referred to cardiology and seen by Dr. Jacinto Halim who did not feel that he had any significant cardiac disease but may have had pneumonia. A CT scan the chest on 10/23/2012 showed an irregular 2.7 x 2.2 x 2.7 cm mass in the medial portion of the left upper lobe adjacent to the aortopulmonary window. There is a slightly prominent lymph node in the aortopulmonary window adjacent to the mass in the left upper lobe. 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 There were no other areas of hypermetabolic activity. It was not possible to tell if this prominent  lymph node in the aortopulmonary window was hypermetabolic since it was so close to the tumor. The patient reports that he has continued to have intermittent left-sided chest pain which he says is anteriorly. He has also noticed some intermittent weakness in his voice which he says usually occurs after he coughs up some phlegm but may last for hours. He was referred to Dr. Laneta Simmers for thoracic surgical evaluation.  While it was unclear if the lesion was resectable, it was felt that his best option would be to perform a left VATS to make that determination, then proceed with a thoracotomy if the lesion was felt to be resectable.  All risks, benefits and alternatives of surgery were explained in detail, and the patient agreed to proceed.    Hospital Course:  The patient was admitted to [facility] on 12/07/2012. The patient was taken to the operating room and underwent the above procedure.  Intraoperatively, a VATS could not be performed due to dense adhesions, therefore a thoracotomy was performed.  However, the mass was found to be invading the mediastinum at the AP window and was adherent to the pulmonary artery and aorta.  The area was biopsied, but was otherwise unresectable.  The postoperative course was notable for an ileus, which was managed conservatively and has resolved.  He presently is tolerating a regular diet, and bowel function is back to baseline.  His pulmonary status has remained stable.  Chest tubes were removed in the standard fashion, and follow up x-rays have been stable.  He has been restarted on home blood pressure medications.  Incisions are healing well. Pathology confirmed adenocarcinoma.  He is ambulating in the halls without difficulty.  He has been evaluated on today's date and is ready for discharge home.     Recent vital signs:  Filed Vitals:   12/12/12 0729  BP: 111/56  Pulse: 70  Temp: 99.1 F (37.3 C)  Resp: 22    Recent laboratory studies:  CBC: Recent Labs   12/11/12 0505  WBC 8.0  HGB 11.5*  HCT 33.8*  PLT 171   BMET:  Recent Labs  12/10/12 0850 12/11/12 0505  NA 131* 132*  K 5.2* 4.9  CL 95* 97  CO2 26 28  GLUCOSE 153* 125*  BUN 19 22  CREATININE 1.38* 1.25  CALCIUM 9.2 8.7    PT/INR: No results found for this basename: LABPROT, INR,  in the last 72 hours     Discharge Medications:     Medication List         acetaminophen 325 MG tablet  Commonly known as:  TYLENOL  Take 2 tablets (650 mg total) by mouth every 6 (six) hours as needed for pain.     amLODipine 10 MG tablet  Commonly known as:  NORVASC  Take 10 mg by mouth at bedtime.     aspirin EC 81 MG tablet  Take 81 mg by mouth daily.     ibuprofen 200 MG tablet  Commonly known as:  ADVIL,MOTRIN  Take 400 mg by mouth daily as needed for pain.     levothyroxine 125 MCG tablet  Commonly known as:  SYNTHROID, LEVOTHROID  Take 125 mcg by mouth at bedtime.     rosuvastatin 10 MG tablet  Commonly known as:  CRESTOR  Take 10 mg by mouth daily.     tamsulosin 0.4 MG Caps capsule  Commonly known as:  FLOMAX  Take 0.4 mg by mouth at bedtime.     zolpidem 10 MG tablet  Commonly known as:  AMBIEN  Take 10 mg by mouth at bedtime.         Discharge Instructions:  The patient is to refrain from driving, heavy lifting or strenuous activity.  May shower daily and clean incisions with soap and water.  May resume regular diet.    Follow Up:  Follow-up Information   Follow up with [name], MD In 1 week. (Office will contact you with an appointment)    Specialty:  Cardiothoracic Surgery   Contact information:   [address] [place] [address] [phone]       Follow up with Lajuana Matte., MD. (Office will call to schedule appointment)    Specialty:  Oncology   Contact information:   [address] [place] [address] [phone]         Adella Hare 12/12/2012, 8:37 AM

## 2012-12-13 ENCOUNTER — Ambulatory Visit: Payer: Medicare Other | Admitting: Physical Therapy

## 2012-12-13 ENCOUNTER — Ambulatory Visit: Payer: Medicare Other | Admitting: Internal Medicine

## 2012-12-17 ENCOUNTER — Telehealth: Payer: Self-pay | Admitting: *Deleted

## 2012-12-17 NOTE — Telephone Encounter (Signed)
Spoke with daughter gave her an appt for mtoc 12/20/12 at 4:00.  She verbalized understanding of time and place of appt

## 2012-12-18 ENCOUNTER — Other Ambulatory Visit: Payer: Self-pay | Admitting: *Deleted

## 2012-12-18 DIAGNOSIS — R918 Other nonspecific abnormal finding of lung field: Secondary | ICD-10-CM

## 2012-12-19 ENCOUNTER — Telehealth: Payer: Self-pay | Admitting: *Deleted

## 2012-12-19 ENCOUNTER — Telehealth: Payer: Self-pay | Admitting: Internal Medicine

## 2012-12-19 ENCOUNTER — Ambulatory Visit (INDEPENDENT_AMBULATORY_CARE_PROVIDER_SITE_OTHER): Payer: Self-pay | Admitting: Surgery

## 2012-12-19 ENCOUNTER — Encounter: Payer: Self-pay | Admitting: Surgery

## 2012-12-19 ENCOUNTER — Ambulatory Visit
Admission: RE | Admit: 2012-12-19 | Discharge: 2012-12-19 | Disposition: A | Discharge status: Home / Self Care | Payer: Medicare Other | Source: Ambulatory Visit | Attending: Cardiothoracic Surgery | Admitting: Cardiothoracic Surgery

## 2012-12-19 DIAGNOSIS — C3410 Malignant neoplasm of upper lobe, unspecified bronchus or lung: Secondary | ICD-10-CM

## 2012-12-19 DIAGNOSIS — R918 Other nonspecific abnormal finding of lung field: Secondary | ICD-10-CM

## 2012-12-19 DIAGNOSIS — Z09 Encounter for follow-up examination after completed treatment for conditions other than malignant neoplasm: Secondary | ICD-10-CM

## 2012-12-19 NOTE — Telephone Encounter (Signed)
C/D 12/19/12 for appt. 12/20/12

## 2012-12-19 NOTE — Telephone Encounter (Signed)
Spoke with pt regarding appt for [facility] 12/20/12 at 4:00 arrive at 3:45.  He verbalized understanding of time and place of appt

## 2012-12-20 ENCOUNTER — Ambulatory Visit: Payer: Medicare Other | Attending: Internal Medicine | Admitting: Physical Therapy

## 2012-12-20 ENCOUNTER — Ambulatory Visit (HOSPITAL_BASED_OUTPATIENT_CLINIC_OR_DEPARTMENT_OTHER): Payer: Medicare Other | Admitting: Internal Medicine

## 2012-12-20 ENCOUNTER — Encounter: Payer: Self-pay | Admitting: *Deleted

## 2012-12-20 ENCOUNTER — Telehealth: Payer: Self-pay | Admitting: Internal Medicine

## 2012-12-20 ENCOUNTER — Encounter: Payer: Self-pay | Admitting: Internal Medicine

## 2012-12-20 ENCOUNTER — Ambulatory Visit
Admission: RE | Admit: 2012-12-20 | Discharge: 2012-12-20 | Disposition: A | Discharge status: Home / Self Care | Payer: Medicare Other | Source: Ambulatory Visit | Attending: Radiation Oncology | Admitting: Radiation Oncology

## 2012-12-20 VITALS — BP 131/60 | HR 66 | Temp 98.2°F | Resp 20 | Ht 68.0 in | Wt 190.1 lb

## 2012-12-20 DIAGNOSIS — C3492 Malignant neoplasm of unspecified part of left bronchus or lung: Secondary | ICD-10-CM | POA: Insufficient documentation

## 2012-12-20 DIAGNOSIS — Z01818 Encounter for other preprocedural examination: Secondary | ICD-10-CM | POA: Insufficient documentation

## 2012-12-20 DIAGNOSIS — R293 Abnormal posture: Secondary | ICD-10-CM | POA: Insufficient documentation

## 2012-12-20 DIAGNOSIS — IMO0001 Reserved for inherently not codable concepts without codable children: Secondary | ICD-10-CM | POA: Insufficient documentation

## 2012-12-20 DIAGNOSIS — C3410 Malignant neoplasm of upper lobe, unspecified bronchus or lung: Secondary | ICD-10-CM | POA: Insufficient documentation

## 2012-12-20 DIAGNOSIS — C3490 Malignant neoplasm of unspecified part of unspecified bronchus or lung: Secondary | ICD-10-CM | POA: Insufficient documentation

## 2012-12-20 MED ORDER — PROCHLORPERAZINE MALEATE 10 MG PO TABS: 10.0000 mg | ORAL_TABLET | Freq: Four times a day (QID) | ORAL | Status: DC | PRN

## 2012-12-20 NOTE — Progress Notes (Signed)
[facility] Clinical Social Work  Clinical Social Work met with patient, patient's daughter and sister, and medical oncologist for assessment of psychosocial needs.  MD reviewed diagnosis and treatment plan with patient and family.  The patient verbalized understanding and is eager to begin treatment.  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  He lives alone, however, his daughter and son-in-law have temporarily moved in to provide support at home. Christopher Roach shared that he enjoys activities such as golf and hunting, but has been unable to engage in activities.  He enjoys watching movies and TV and spending time with his family. CSW briefly explained CSW role at [facility] and encouraged patient/family to call with questions or concerns.  Kathrin Penner, MSW, LCSW Clinical Social Worker [facility] [phone]

## 2012-12-20 NOTE — Progress Notes (Signed)
   Thoracic Treatment Summary Name:Christopher Roach Date:12/20/2012 DOB:[birth year] Your Medical Team Medical Oncologist: Dr. Arbutus Ped Radiation Oncologist: Dr. Mitzi Hansen Surgeon: Dr. Laneta Simmers Type and Stage of Lung Cancer Non-Small Cell Carcinoma: Adenocarcinoma Clinical Stage:  II   Clinical stage is based on radiology exams.  Pathological stage will be determined after surgery.  Staging is based on the size of the tumor, involvement of lymph nodes or not, and whether or not the cancer center has spread. Recommendations Recommendations: Concurrent chemoradiation therapy  These recommendations are based on information available as of today's consult.  This is subject to change depending further testing or exams. Next Steps Next Step: 1. Medical Oncology will set up chemo appointments [phone] 2. Radiation Oncology will set up radiation therapy appointments [phone] 3. 4. Barriers to Care What do you perceive as a potential barrier that may prevent you from receiving your treatment plan? Nothing perceived at this time Resources: Cancer Care www.cancercare.org [facility] www.lungcanceralliance.[facility] 559 201 3139 Questions Willette Pa, RN BSN Thoracic Oncology Nurse Navigator at [phone]  Christopher Roach is a nurse navigator that is available to assist you through your cancer journey.  She can answer your questions and/or provide resources regarding your treatment plan, emotional support, or financial concerns.

## 2012-12-20 NOTE — Telephone Encounter (Signed)
Gave  pt appt for lab and Md for September

## 2012-12-21 ENCOUNTER — Telehealth: Payer: Self-pay | Admitting: *Deleted

## 2012-12-21 ENCOUNTER — Encounter: Payer: Self-pay | Admitting: Surgery

## 2012-12-21 NOTE — Progress Notes (Signed)
Radiation Oncology         [phone]) [phone] ________________________________  Name: Christopher Roach MRN: [id number]  Date: 12/20/2012  DOB: 1931/02/06  CC:KIM, Fayrene Fearing, MD  [name], MD     REFERRING PHYSICIAN: [name], MD   DIAGNOSIS: Non-small cell lung cancer  HISTORY OF PRESENT ILLNESS::Christopher Roach is a 79 y.o. male who is seen for an initial consultation visit.  The patient indicates that he suffered from some weight loss in the setting of some left-sided chest pain. This prompted further workup which included a CT scan of the chest on 10/23/2012. This revealed a lesion within the medial left upper lobe abutting the mediastinum which was suspicious for primary lung carcinoma. A small adjacent node measuring 9 mm in short axis was also present. The possibility of several liver lesions was also raised on CT scan of the abdomen. The patient proceeded then to undergo a PET scan on 11/06/2012. This revealed hypermetabolic activity in the left lung tumor. No sign of distant disease or regional disease elsewhere. On the PET scan this appeared to represent a T3, N0, M0 tumor. The patient underwent a left thoracotomy with biopsy of what was an unresectable left upper lobe mass invading the mediastinum. This was completed on 12/07/2012. Pathology returned positive for adenocarcinoma.  The patient's case was discussed in multidisciplinary thoracic conference this morning. Given the unresectability, the patient was felt to represent a good candidate for definitive chemoradiotherapy.   PREVIOUS RADIATION THERAPY: No   PAST MEDICAL HISTORY:  has a past medical history of Hypertension; Hyperlipidemia; Hypothyroidism; Abnormal LFTs; Gallbladder polyp; BPH (benign prostatic hyperplasia); Actinic keratosis; Lung nodule; Anemia; H/O asbestos exposure; Pneumonia; Shortness of breath; Constipation; Arthritis; Kidney stone (August 2014); and Birth mark.     PAST SURGICAL HISTORY: Past Surgical  History  Procedure Laterality Date  . Back surgery      Dr Fannie Knee 1978 and 1982  . Cervical disc surgery    . Right foot surgery      plantar fascitis  . Right shoulder surgery      torn rotator cuff  . Right ankle fracture         . Right wrist fracture    . Foot surgery Left   . Tonsillectomy    . Appendectomy    . Colonoscopy    . Flexible bronchoscopy N/A 12/07/2012    Procedure: FLEXIBLE BRONCHOSCOPY;  Surgeon: [name], MD;  Location: [facility] OR;  Service: Thoracic;  Laterality: N/A;  . Video assisted thoracoscopy (vats)/thorocotomy Left 12/07/2012    Procedure: VIDEO ASSISTED THORACOSCOPY (VATS)/ EXPLORATORY THOROCOTOMY;  Surgeon: [name], MD;  Location: [facility] OR;  Service: Thoracic;  Laterality: Left;     FAMILY HISTORY: family history includes Alzheimer's disease in his mother; Breast cancer in his sister; Heart disease in his father and son; Hypertension in his father; Skin cancer in his brother.   SOCIAL HISTORY:  reports that he quit smoking about 20 years ago. His smoking use included Cigarettes. He has a 40 pack-year smoking history. He does not have any smokeless tobacco history on file. He reports that he does not drink alcohol or use illicit drugs.   ALLERGIES: Codeine   MEDICATIONS:  Current Outpatient Prescriptions  Medication Sig Dispense Refill  . acetaminophen (TYLENOL) 325 MG tablet Take 2 tablets (650 mg total) by mouth every 6 (six) hours as needed for pain.      Marland Kitchen amLODipine (NORVASC) 10 MG tablet Take 10  mg by mouth at bedtime.       Marland Kitchen aspirin EC 81 MG tablet Take 81 mg by mouth daily.      Marland Kitchen ibuprofen (ADVIL,MOTRIN) 200 MG tablet Take 400 mg by mouth daily as needed for pain.       Marland Kitchen levothyroxine (SYNTHROID, LEVOTHROID) 125 MCG tablet Take 125 mcg by mouth at bedtime.       . prochlorperazine (COMPAZINE) 10 MG tablet Take 1 tablet (10 mg total) by mouth every 6 (six) hours as needed.  60 tablet  0  . rosuvastatin (CRESTOR) 10 MG tablet Take 10 mg  by mouth daily.      . tamsulosin (FLOMAX) 0.4 MG CAPS Take 0.4 mg by mouth at bedtime.       Marland Kitchen zolpidem (AMBIEN) 10 MG tablet Take 10 mg by mouth at bedtime.        No current facility-administered medications for this encounter.     REVIEW OF SYSTEMS:  A 15 point review of systems is documented in the electronic medical record. This was obtained by the nursing staff. However, I reviewed this with the patient to discuss relevant findings and make appropriate changes.  Pertinent items are noted in HPI.    PHYSICAL EXAM:  vitals were not taken for this visit.  General: Well-developed, in no acute distress HEENT: Normocephalic, atraumatic; oral cavity clear Neck: Supple without any lymphadenopathy Cardiovascular: Regular rate and rhythm Respiratory: Clear to auscultation bilaterally GI: Soft, nontender, normal bowel sounds Extremities: No edema present Neuro: No focal deficits     LABORATORY DATA:  Lab Results  Component Value Date   WBC 8.0 12/11/2012   HGB 11.5* 12/11/2012   HCT 33.8* 12/11/2012   MCV 90.4 12/11/2012   PLT 171 12/11/2012   Lab Results  Component Value Date   NA 132* 12/11/2012   K 4.9 12/11/2012   CL 97 12/11/2012   CO2 28 12/11/2012   Lab Results  Component Value Date   ALT 35 12/05/2012   AST 44* 12/05/2012   ALKPHOS 129* 12/05/2012   BILITOT 0.5 12/05/2012      RADIOGRAPHY: Dg Chest 2 View  12/19/2012   *RADIOLOGY REPORT*  Clinical Data: Postoperative evaluation after resection of lung mass.  CHEST - 2 VIEW  Comparison: 12/11/2012 and 12/10/2012.  Findings: Stable volume loss in the left hemithorax.  No evidence for pneumothorax.  Left-sided pleural thickening towards the base is stable.  Subcutaneous emphysema and the basilar pneumothorax seen previously have resolved in the interval.  Right lung is hyperexpanded but clear. The cardiopericardial silhouette is enlarged.  Bones are diffusely demineralized.  IMPRESSION: Interval resolution of tiny left pneumothorax and  subcutaneous emphysema.  Stable appearance of pleural thickening in the lower left hemithorax.   Original Report Authenticated By: Kennith Center, M.D.   Dg Chest 2 View  12/11/2012   *RADIOLOGY REPORT*  Clinical Data: Post left sided vats, evaluate for pneumothorax  CHEST - 2 VIEW  Comparison: 12/10/2012; 12/09/2012; 12/08/2012  Findings:  Grossly unchanged cardiac silhouette and mediastinal contours with mild tortuosity of the thoracic aorta.  There is persistent mild rightward tracheal deviation at the level of the aortic arch.  The lungs remain hyperexpanded.  Interval removal left sided chest tube with development of a tiny loculated left basilar hydropneumothorax.  No change to slight increase in amount of left lateral chest wall subcutaneous emphysema.  Left basilar heterogeneous opacity are grossly unchanged.  No new focal airspace opacity. No definite evidence of edema.  No definite pleural effusion.  Unchanged bones.  A surgical clip overlies the left lower neck.  IMPRESSION: Interval removal of left-sided chest tube with development of a tiny left basilar hydropneumothorax.   Original Report Authenticated By: Tacey Ruiz, MD   Dg Chest 2 View  12/05/2012   *RADIOLOGY REPORT*  Clinical Data: Preop for lung mass.  CHEST - 2 VIEW  Comparison: Plain films 09/25/2012.  P E T 11/06/2012  Findings: There is tracheal deviation to the left, likely due to volume loss in the left hemithorax.  Mild cardiomegaly with a tortuous descending thoracic aorta.  Small left-sided pleural effusion versus thickening, chronic. No pneumothorax.  Moderate lower lobe predominant interstitial thickening.  Scarring at the left lung base.  The primary lesion, which abuts the left side of the mediastinum at the level of the AP window, is not readily apparent.  IMPRESSION: Interstitial thickening and left-sided pleural parenchymal scarring.  The primary lesion, in the medial left upper lobe, is not readily plain film apparent.    Original Report Authenticated By: Jeronimo Greaves, M.D.   Dg Chest Port 1 View  12/10/2012   *RADIOLOGY REPORT*  Clinical Data: Lung cancer status post left thoracotomy.  PORTABLE CHEST - 1 VIEW  Comparison: Chest x-ray 12/09/2012.  Findings: Left-sided chest tube remains in position with tip in the apex of the left hemithorax.  No appreciable left pneumothorax is identified at this time.  Extensive architectural distortion throughout the left lung likely represents some resolving postoperative edema/hemorrhage.  Right lung appears relatively clear.  Bibasilar opacities favored to reflect subsegmental atelectasis.  No evidence of pulmonary edema.  Heart size is borderline enlarged.  Mediastinal contours are within normal limits.  Atherosclerosis in the thoracic aorta.  IMPRESSION: 1.  Allowing for slight differences in patient positioning, the radiographic appearance of the chest is essentially unchanged, as above.   Original Report Authenticated By: Trudie Reed, M.D.   Dg Chest Port 1 View  12/09/2012   *RADIOLOGY REPORT*  Clinical Data: Status post left thoracotomy  PORTABLE CHEST - 1 VIEW  Comparison: 12/08/2012  Findings: The heart and pulmonary vascularity are within normal limits.  The left-sided chest tube is again identified and stable. No pneumothorax is seen.  The right lung remains clear.  Patchy changes are identified in the left lung.  IMPRESSION: No pneumothorax.  Patchy changes in the left lung stable from prior exam.   Original Report Authenticated By: Alcide Clever, M.D.   Dg Chest Port 1 View  12/08/2012   *RADIOLOGY REPORT*  Clinical Data: Left chest tube.  PORTABLE CHEST - 1 VIEW  Comparison: Chest x-ray from yesterday.  Findings: Left-sided chest tube, extending over the apex.  There is been a modest increase in left chest subcutaneous emphysema.  No evident pneumothorax.  Unchanged atelectasis and pleural thickening at the left base.  Mild right basilar atelectasis.  Stable positioning of  right IJ approach central venous catheter.  Unchanged upper mediastinal contours. Chronic cardiomegaly.  IMPRESSION:  1.  No definitive pneumothorax, although left chest subcutaneous emphysema has slightly increased.  Left chest tube in stable position. 2.  Left more than right base atelectasis, similar yesterday.   Original Report Authenticated By: Tiburcio Pea   Dg Chest Portable 1 View  12/07/2012   *RADIOLOGY REPORT*  Clinical Data: Status post surgery with central line placement and chest tube placement  PORTABLE CHEST - 1 VIEW  Comparison: 12/05/2012  Findings: A right-sided jugular line is noted in the mid superior  vena cava.  A left the left-sided chest tube is seen new from prior exam.  Some atelectasis is noted in the left base.  No pneumothorax is noted bilaterally.  Cardiac shadow is stable.  IMPRESSION: Postsurgical changes are noted.  No acute abnormality is seen with exception of atelectasis in the left lung base.  These results were called by telephone on 12/07/2012 at in 23 hours to [facility] the patient's nurse, who verbally acknowledged these results.   Original Report Authenticated By: Alcide Clever, M.D.   Dg Abd 2 Views  12/11/2012   *RADIOLOGY REPORT*  Clinical Data: Abdominal distension, evaluate for ileus  ABDOMEN - 2 VIEW  Comparison: PET CT - 11/06/2012; chest radiograph of earlier same day  Findings:  There is mild gaseous distension of several loops of small bowel within this loop within the left mid abdomen measuring approximately 3.7 cm in diameter.  This finding is associated with a paucity of bowel gas with small amount of air is seen within the hepatic flexure of the colon.  No definite pneumoperitoneum, pneumatosis or portal venous gas.  Limited visualization of the lower thorax demonstrates a residual tiny left basilar hydropneumothorax post recent left-sided chest tube removal.  There is a persistent small likely partially locular left-sided effusion.  There is a minimal amount  of residual subcutaneous emphysema about the inferior left lateral chest wall.  Multilevel thoracolumbar are spine degenerative change.  IMPRESSION: 1. Nonspecific mild gaseous distension of several loops of small bowel with overall paucity of distal colonic gas.  While possibly representative of ileus, developing partial small bowel obstruction is not excluded.  Continued attention on follow up may be performed as clinically indicated. 2.  Limited visualization of the lower thorax demonstrates a persistent trace left-sided hydropneumothorax post recent chest tube removal.   Original Report Authenticated By: Tacey Ruiz, MD       IMPRESSION: The patient has a recent diagnosis of non-small cell lung cancer. This represents an unresectable tumor in the left upper lobe with invasion of the mediastinum in the adjacent area. The patient is an appropriate candidate for definitive chemoradiotherapy and this was the consensus recommendation from thoracic multidisciplinary conference. I am seeing him today in the multidisciplinary clinic as well as Dr. Arbutus Ped in medical oncology.  I discussed with the patient therefore a potential 6-1/2 week course of chemoradiotherapy. I discussed the potential/expected benefit of radiotherapy in the setting of his diagnosis. This would appear to represent potentially clinically a stage II B. tumor. We also discussed the potential side effects and risks of treatment as well. All of his questions were answered. The patient does wish to proceed with this overall treatment plan.   PLAN: The patient will proceed with a simulation as soon as this can be arranged. I anticipate a 6-1/2 weeks of chemoradiotherapy. We will plan to begin treatment on 12/31/2012.   I spent 60 minutes face to face with the patient and more than 50% of that time was spent in counseling and/or coordination of care.    ________________________________   Radene Gunning, MD, PhD

## 2012-12-21 NOTE — Progress Notes (Signed)
      [address]       [place] [address]             [phone]        HPI:  Patient returns for routine postoperative follow-up having undergone left thoracotomy and biopsy of an unresectable left upper lobe mass invading the mediastinum on 12/07/2012. The patient's early postoperative recovery while in the hospital was notable for an uncomplicated postop course. Since hospital discharge the patient reports that he has been feeling fairly well. He is walking daily. He has some left chest wall discomfort.   Current Outpatient Prescriptions  Medication Sig Dispense Refill  . acetaminophen (TYLENOL) 325 MG tablet Take 2 tablets (650 mg total) by mouth every 6 (six) hours as needed for pain.      Marland Kitchen amLODipine (NORVASC) 10 MG tablet Take 10 mg by mouth at bedtime.       Marland Kitchen aspirin EC 81 MG tablet Take 81 mg by mouth daily.      Marland Kitchen ibuprofen (ADVIL,MOTRIN) 200 MG tablet Take 400 mg by mouth daily as needed for pain.       Marland Kitchen levothyroxine (SYNTHROID, LEVOTHROID) 125 MCG tablet Take 125 mcg by mouth at bedtime.       . rosuvastatin (CRESTOR) 10 MG tablet Take 10 mg by mouth daily.      . tamsulosin (FLOMAX) 0.4 MG CAPS Take 0.4 mg by mouth at bedtime.       Marland Kitchen zolpidem (AMBIEN) 10 MG tablet Take 10 mg by mouth at bedtime.       . prochlorperazine (COMPAZINE) 10 MG tablet Take 1 tablet (10 mg total) by mouth every 6 (six) hours as needed.  60 tablet  0   No current facility-administered medications for this visit.    Physical Exam: BP 133/74  Pulse 58  Resp 16  Ht 5\' 8"  (1.727 m)  Wt 190 lb (86.183 kg)  BMI 28.9 kg/m2  SpO2 98% He looks well Lungs are clear The left chest incision is healing well. There is a seroma beneath the incision that is nontender. Cardiac exam shows a regular rate and rhythm.  Diagnostic Tests:  *RADIOLOGY REPORT*   Clinical Data: Postoperative evaluation after resection of lung mass.   CHEST - 2 VIEW   Comparison: 12/11/2012 and  12/10/2012.   Findings: Stable volume loss in the left hemithorax.  No evidence for pneumothorax.  Left-sided pleural thickening towards the base is stable.  Subcutaneous emphysema and the basilar pneumothorax seen previously have resolved in the interval.  Right lung is hyperexpanded but clear. The cardiopericardial silhouette is enlarged.  Bones are diffusely demineralized.   IMPRESSION: Interval resolution of tiny left pneumothorax and subcutaneous emphysema.   Stable appearance of pleural thickening in the lower left hemithorax.     Original Report Authenticated By: Kennith Center, M.D.     Impression:  He is doing well following his surgery. He has an unresectable stage IIIA adenocarcinoma of the lung. There is some seroma beneath the incision but that should resolve on its own.  Plan:  He has an appt to see medical oncology and radiation oncology in [facility] clinic tomorrow. He will return to see me if the seroma does not progressively resolve.

## 2012-12-21 NOTE — Telephone Encounter (Signed)
Per staff message and POF I have scheduled appts.  JMW  

## 2012-12-22 ENCOUNTER — Encounter: Payer: Self-pay | Admitting: Internal Medicine

## 2012-12-22 NOTE — Progress Notes (Signed)
[facility] [phone]) [phone]   Fax:([phone]) [phone] Multidisciplinary thoracic oncology clinic (MTOC)  CONSULT NOTE  REFERRING PHYSICIAN: Dr. Rexanne Mano  REASON FOR CONSULTATION:  79 years old white male recently diagnosed with unresectable lung cancer  HPI ARJUN HARD is a 79 y.o. male with past medical history significant for multiple medical problems including hypertension, dyslipidemia, hypothyroidism, benign prostatic hypertrophy, and anemia, back surgery, right shoulder surgery as well as long history of smoking but quit 20 years ago. The patient mentions that 2 months ago he start having heart fluttering and pain in the chest. He has cardiac workup by Dr. Jacinto Halim that was unremarkable. Chest x-ray showed questionable left lung nodule. This was followed by CT scan of the head, chest, abdomen and pelvis on 10/23/2012 and it showed no acute intracranial abnormalities in the head. In the chest there is an irregularly marginated lesion within the medial left upper lobe abutting the mediastinum and measured 2.7 x 2.2 x 2.7 CM suspicious for primary lung carcinoma. There is an adjacent node measuring 9 mm in short axis diameter and may represent metastatic involvement. On the CT scan of the abdomen there was suspicion of several liver lesions and MRI was recommended to exclude multicentric hepatocellular carcinoma versus metastatic involvement of the liver. On 11/06/2012 the patient underwent a PET scan and it showed a malignant change FDG uptake associated with the left upper lobe paramediastinal tumor. Based on the imaging findings this would be T3, N0, M0 non-small cell lung cancer. The PET scan showed no abnormal hypermetabolic activity within the liver, pancreas, adrenal glands or spleen. The liver has an irregular contour suggestive of cirrhosis but no hypermetabolic liver lesions identified. On 12/07/2012 the patient underwent active and resection of his lung cancer  under the care of Dr. Laneta Simmers. He had flexible video bronchoscopy, at the left VATS, right muscle-sparing thoracotomy and biopsy of the left upper lobe mass. The left upper lobe mass was directly invading the AP window and firmly adherant to the left pulmonary artery and the aorta. There were multiple firm lymph nodes palpable in hilum and AP window. A biopsy of the mass was sent to pathology and frozen section showed adenocarcinoma.  The final pathology (Accession: [id number]) of the left upper lobe lung mass was consistent with adenocarcinoma. The tissue block was sent for EGFR mutation as well as ALK gene translocation and both tests were negative.  The patient is recovering slowly from his recent surgical procedure and he was referred to me today by Dr. Laneta Simmers for evaluation and discussion of his treatment options. When seen today he continues to complain of soreness in the left side of the chest as well as cough and shortness of breath with exertion. He denied having any hemoptysis. He denied having any significant weight loss but he has occasional night sweats especially after the surgery. The patient denied having any nausea or vomiting or constipation. He denied having any visual changes but has occasional headache. Family history significant for a father who had heart disease and mother had Alzheimer. The patient is a widowed and has 2 children a son and daughter. His son is deceased. He was accompanied today by his daughter Lynnell Dike with the current caregiver for him. He was also accompanied by his sister Eunice Blase. The patient is a retired [employer] banker. He has a history of smoking one pack per day for around 40 years but quit 20 years ago. He has no history of alcohol or drug abuse.   @  OZHYQ@  Past Medical History  Diagnosis Date  . Hypertension   . Hyperlipidemia   . Hypothyroidism   . Abnormal LFTs   . Gallbladder polyp   . BPH (benign prostatic hyperplasia)   . Actinic keratosis   . Lung nodule      benign BX 03/24/2008  . Anemia   . H/O asbestos exposure   . Pneumonia     hx of walking PNA  . Shortness of breath     with exertion  . Constipation     from medications  . Arthritis   . Kidney stone August 2014    hx of  . Birth mark     left arm and shoulder; Dark purple on upper L chest ;NOT A RASH    Past Surgical History  Procedure Laterality Date  . Back surgery      Dr Fannie Knee 1978 and 1982  . Cervical disc surgery    . Right foot surgery      plantar fascitis  . Right shoulder surgery      torn rotator cuff  . Right ankle fracture         . Right wrist fracture    . Foot surgery Left   . Tonsillectomy    . Appendectomy    . Colonoscopy    . Flexible bronchoscopy N/A 12/07/2012    Procedure: FLEXIBLE BRONCHOSCOPY;  Surgeon: [name], MD;  Location: [facility] OR;  Service: Thoracic;  Laterality: N/A;  . Video assisted thoracoscopy (vats)/thorocotomy Left 12/07/2012    Procedure: VIDEO ASSISTED THORACOSCOPY (VATS)/ EXPLORATORY THOROCOTOMY;  Surgeon: [name], MD;  Location: [facility] OR;  Service: Thoracic;  Laterality: Left;    Family History  Problem Relation Age of Onset  . Hypertension Father   . Heart disease Father     smoker  . Alzheimer's disease Mother   . Breast cancer Sister   . Skin cancer Brother   . Heart disease Son     Social History History  Substance Use Topics  . Smoking status: Former Smoker -- 1.00 packs/day for 40 years    Types: Cigarettes    Quit date: 12/20/1992  . Smokeless tobacco: Not on file  . Alcohol Use: No    Allergies  Allergen Reactions  . Codeine Nausea And Vomiting    "deathly sick"    Current Outpatient Prescriptions  Medication Sig Dispense Refill  . acetaminophen (TYLENOL) 325 MG tablet Take 2 tablets (650 mg total) by mouth every 6 (six) hours as needed for pain.      Marland Kitchen amLODipine (NORVASC) 10 MG tablet Take 10 mg by mouth at bedtime.       Marland Kitchen aspirin EC 81 MG tablet Take 81 mg by mouth daily.      Marland Kitchen  ibuprofen (ADVIL,MOTRIN) 200 MG tablet Take 400 mg by mouth daily as needed for pain.       Marland Kitchen levothyroxine (SYNTHROID, LEVOTHROID) 125 MCG tablet Take 125 mcg by mouth at bedtime.       . rosuvastatin (CRESTOR) 10 MG tablet Take 10 mg by mouth daily.      . tamsulosin (FLOMAX) 0.4 MG CAPS Take 0.4 mg by mouth at bedtime.       Marland Kitchen zolpidem (AMBIEN) 10 MG tablet Take 10 mg by mouth at bedtime.       . prochlorperazine (COMPAZINE) 10 MG tablet Take 1 tablet (10 mg total) by mouth every 6 (six) hours as needed.  60 tablet  0  No current facility-administered medications for this visit.    Review of Systems  Constitutional: positive for anorexia, fatigue and night sweats Eyes: negative Ears, nose, mouth, throat, and face: negative Respiratory: positive for cough, dyspnea on exertion and pleurisy/chest pain Cardiovascular: negative Gastrointestinal: negative Genitourinary:negative Integument/breast: negative Hematologic/lymphatic: negative Musculoskeletal:negative Neurological: positive for headaches Behavioral/Psych: negative Endocrine: negative Allergic/Immunologic: negative  Physical Exam  RUE:AVWUJ, healthy, no distress, well nourished and well developed SKIN: skin color, texture, turgor are normal, no rashes or significant lesions HEAD: Normocephalic, No masses, lesions, tenderness or abnormalities EYES: normal, PERRLA EARS: External ears normal, Canals clear OROPHARYNX:no exudate, no erythema and lips, buccal mucosa, and tongue normal  NECK: supple, no adenopathy LYMPH:  no palpable lymphadenopathy, no hepatosplenomegaly LUNGS: clear to auscultation , and palpation HEART: regular rate & rhythm, no murmurs and no gallops ABDOMEN:abdomen soft, non-tender, normal bowel sounds and no masses or organomegaly BACK: Back symmetric, no curvature., No CVA tenderness EXTREMITIES:no joint deformities, effusion, or inflammation, no edema, no skin discoloration, no clubbing  NEURO: alert  & oriented x 3 with fluent speech, no focal motor/sensory deficits  PERFORMANCE STATUS: ECOG 1  LABORATORY DATA: Lab Results  Component Value Date   WBC 8.0 12/11/2012   HGB 11.5* 12/11/2012   HCT 33.8* 12/11/2012   MCV 90.4 12/11/2012   PLT 171 12/11/2012      Chemistry      Component Value Date/Time   NA 132* 12/11/2012 0505   K 4.9 12/11/2012 0505   CL 97 12/11/2012 0505   CO2 28 12/11/2012 0505   BUN 22 12/11/2012 0505   CREATININE 1.25 12/11/2012 0505      Component Value Date/Time   CALCIUM 8.7 12/11/2012 0505   ALKPHOS 129* 12/05/2012 1348   AST 44* 12/05/2012 1348   ALT 35 12/05/2012 1348   BILITOT 0.5 12/05/2012 1348       RADIOGRAPHIC STUDIES: Dg Chest 2 View  12/19/2012   *RADIOLOGY REPORT*  Clinical Data: Postoperative evaluation after resection of lung mass.  CHEST - 2 VIEW  Comparison: 12/11/2012 and 12/10/2012.  Findings: Stable volume loss in the left hemithorax.  No evidence for pneumothorax.  Left-sided pleural thickening towards the base is stable.  Subcutaneous emphysema and the basilar pneumothorax seen previously have resolved in the interval.  Right lung is hyperexpanded but clear. The cardiopericardial silhouette is enlarged.  Bones are diffusely demineralized.  IMPRESSION: Interval resolution of tiny left pneumothorax and subcutaneous emphysema.  Stable appearance of pleural thickening in the lower left hemithorax.   Original Report Authenticated By: Kennith Center, M.D.   Dg Chest 2 View  12/11/2012   *RADIOLOGY REPORT*  Clinical Data: Post left sided vats, evaluate for pneumothorax  CHEST - 2 VIEW  Comparison: 12/10/2012; 12/09/2012; 12/08/2012  Findings:  Grossly unchanged cardiac silhouette and mediastinal contours with mild tortuosity of the thoracic aorta.  There is persistent mild rightward tracheal deviation at the level of the aortic arch.  The lungs remain hyperexpanded.  Interval removal left sided chest tube with development of a tiny loculated left basilar  hydropneumothorax.  No change to slight increase in amount of left lateral chest wall subcutaneous emphysema.  Left basilar heterogeneous opacity are grossly unchanged.  No new focal airspace opacity. No definite evidence of edema.  No definite pleural effusion.  Unchanged bones.  A surgical clip overlies the left lower neck.  IMPRESSION: Interval removal of left-sided chest tube with development of a tiny left basilar hydropneumothorax.   Original Report Authenticated By: Jonny Ruiz  Judithann Sheen, MD   Dg Chest 2 View  12/05/2012   *RADIOLOGY REPORT*  Clinical Data: Preop for lung mass.  CHEST - 2 VIEW  Comparison: Plain films 09/25/2012.  P E T 11/06/2012  Findings: There is tracheal deviation to the left, likely due to volume loss in the left hemithorax.  Mild cardiomegaly with a tortuous descending thoracic aorta.  Small left-sided pleural effusion versus thickening, chronic. No pneumothorax.  Moderate lower lobe predominant interstitial thickening.  Scarring at the left lung base.  The primary lesion, which abuts the left side of the mediastinum at the level of the AP window, is not readily apparent.  IMPRESSION: Interstitial thickening and left-sided pleural parenchymal scarring.  The primary lesion, in the medial left upper lobe, is not readily plain film apparent.   Original Report Authenticated By: Jeronimo Greaves, M.D.   Dg Chest Port 1 View  12/10/2012   *RADIOLOGY REPORT*  Clinical Data: Lung cancer status post left thoracotomy.  PORTABLE CHEST - 1 VIEW  Comparison: Chest x-ray 12/09/2012.  Findings: Left-sided chest tube remains in position with tip in the apex of the left hemithorax.  No appreciable left pneumothorax is identified at this time.  Extensive architectural distortion throughout the left lung likely represents some resolving postoperative edema/hemorrhage.  Right lung appears relatively clear.  Bibasilar opacities favored to reflect subsegmental atelectasis.  No evidence of pulmonary edema.  Heart  size is borderline enlarged.  Mediastinal contours are within normal limits.  Atherosclerosis in the thoracic aorta.  IMPRESSION: 1.  Allowing for slight differences in patient positioning, the radiographic appearance of the chest is essentially unchanged, as above.   Original Report Authenticated By: Trudie Reed, M.D.   Dg Chest Port 1 View  12/09/2012   *RADIOLOGY REPORT*  Clinical Data: Status post left thoracotomy  PORTABLE CHEST - 1 VIEW  Comparison: 12/08/2012  Findings: The heart and pulmonary vascularity are within normal limits.  The left-sided chest tube is again identified and stable. No pneumothorax is seen.  The right lung remains clear.  Patchy changes are identified in the left lung.  IMPRESSION: No pneumothorax.  Patchy changes in the left lung stable from prior exam.   Original Report Authenticated By: Alcide Clever, M.D.   Dg Chest Port 1 View  12/08/2012   *RADIOLOGY REPORT*  Clinical Data: Left chest tube.  PORTABLE CHEST - 1 VIEW  Comparison: Chest x-ray from yesterday.  Findings: Left-sided chest tube, extending over the apex.  There is been a modest increase in left chest subcutaneous emphysema.  No evident pneumothorax.  Unchanged atelectasis and pleural thickening at the left base.  Mild right basilar atelectasis.  Stable positioning of right IJ approach central venous catheter.  Unchanged upper mediastinal contours. Chronic cardiomegaly.  IMPRESSION:  1.  No definitive pneumothorax, although left chest subcutaneous emphysema has slightly increased.  Left chest tube in stable position. 2.  Left more than right base atelectasis, similar yesterday.   Original Report Authenticated By: Tiburcio Pea   Dg Chest Portable 1 View  12/07/2012   *RADIOLOGY REPORT*  Clinical Data: Status post surgery with central line placement and chest tube placement  PORTABLE CHEST - 1 VIEW  Comparison: 12/05/2012  Findings: A right-sided jugular line is noted in the mid superior vena cava.  A left the  left-sided chest tube is seen new from prior exam.  Some atelectasis is noted in the left base.  No pneumothorax is noted bilaterally.  Cardiac shadow is stable.  IMPRESSION: Postsurgical changes  are noted.  No acute abnormality is seen with exception of atelectasis in the left lung base.  These results were called by telephone on 12/07/2012 at in 23 hours to [facility] the patient's nurse, who verbally acknowledged these results.   Original Report Authenticated By: Alcide Clever, M.D.   Dg Abd 2 Views  12/11/2012   *RADIOLOGY REPORT*  Clinical Data: Abdominal distension, evaluate for ileus  ABDOMEN - 2 VIEW  Comparison: PET CT - 11/06/2012; chest radiograph of earlier same day  Findings:  There is mild gaseous distension of several loops of small bowel within this loop within the left mid abdomen measuring approximately 3.7 cm in diameter.  This finding is associated with a paucity of bowel gas with small amount of air is seen within the hepatic flexure of the colon.  No definite pneumoperitoneum, pneumatosis or portal venous gas.  Limited visualization of the lower thorax demonstrates a residual tiny left basilar hydropneumothorax post recent left-sided chest tube removal.  There is a persistent small likely partially locular left-sided effusion.  There is a minimal amount of residual subcutaneous emphysema about the inferior left lateral chest wall.  Multilevel thoracolumbar are spine degenerative change.  IMPRESSION: 1. Nonspecific mild gaseous distension of several loops of small bowel with overall paucity of distal colonic gas.  While possibly representative of ileus, developing partial small bowel obstruction is not excluded.  Continued attention on follow up may be performed as clinically indicated. 2.  Limited visualization of the lower thorax demonstrates a persistent trace left-sided hydropneumothorax post recent chest tube removal.   Original Report Authenticated By: Tacey Ruiz, MD    ASSESSMENT: This is  a very pleasant 79 years old white male with locally advanced disease suspicious for a stage IIIA (T1b, N2, M0) non-small cell lung cancer, adenocarcinoma with negative EGFR mutation and negative ALK gene translocation. The patient presented with left upper lobe lung mass as well as questionable mediastinal lymphadenopathy based on the recent surgical procedure. He has unresectable tumor.   PLAN: I have a lengthy discussion with the patient and his family today about his current disease stage, prognosis and treatment options. I recommended for the patient a course of concurrent chemoradiation with weekly carboplatin for AUC of 2 and paclitaxel 45 mg/M2. I discussed with the patient and his family the adverse effect of the chemotherapy including but not limited to alopecia, myelosuppression, nausea and vomiting, peripheral neuropathy, liver or renal dysfunction. I will arrange for the patient to have a chemotherapy education class before starting the first cycle of his treatment. I will call his pharmacy with prescription for Compazine 10 mg by mouth every 6 hours as needed for nausea. The patient will be seen later today by Dr. Mitzi Hansen for evaluation and discussion of the radiotherapy option. He is expected to start the first cycle of this concurrent chemoradiation on 12/31/2012. He would come back for followup visit in 2 weeks for evaluation and management any adverse effect of his treatment. I gave the patient and his family the time to ask questions and I answered them completely to their satisfaction. For hypertension, the patient will continue his current treatment with Norvasc.   The patient voices understanding of current disease status and treatment options and is in agreement with the current care plan.  All questions were answered. The patient knows to call the clinic with any problems, questions or concerns. We can certainly see the patient much sooner if necessary.  Thank you so much for  allowing  me to participate in the care of KENNY REA. I will continue to follow up the patient with you and assist in his care.  I spent 55 minutes counseling the patient face to face. The total time spent in the appointment was 80 minutes.  Allizon Woznick K. 12/22/2012, 4:32 PM

## 2012-12-22 NOTE — Patient Instructions (Signed)
We discussed concurrent chemoradiation with weekly carboplatin and paclitaxel. First dose of chemotherapy on 12/31/2012

## 2012-12-24 ENCOUNTER — Other Ambulatory Visit: Payer: Medicare Other

## 2012-12-25 ENCOUNTER — Telehealth: Payer: Self-pay | Admitting: Internal Medicine

## 2012-12-25 NOTE — Telephone Encounter (Signed)
Called pt and left message regarding lab , MD and chemo for September and October 2014

## 2012-12-27 ENCOUNTER — Ambulatory Visit
Admission: RE | Admit: 2012-12-27 | Discharge: 2012-12-27 | Disposition: A | Discharge status: Home / Self Care | Payer: Medicare Other | Source: Ambulatory Visit | Attending: Radiation Oncology | Admitting: Radiation Oncology

## 2012-12-27 DIAGNOSIS — Z79899 Other long term (current) drug therapy: Secondary | ICD-10-CM | POA: Insufficient documentation

## 2012-12-27 DIAGNOSIS — K209 Esophagitis, unspecified without bleeding: Secondary | ICD-10-CM | POA: Insufficient documentation

## 2012-12-27 DIAGNOSIS — C3410 Malignant neoplasm of upper lobe, unspecified bronchus or lung: Secondary | ICD-10-CM | POA: Insufficient documentation

## 2012-12-27 DIAGNOSIS — Z510 Encounter for antineoplastic radiation therapy: Secondary | ICD-10-CM | POA: Insufficient documentation

## 2012-12-27 MED ORDER — HYDROMORPHONE HCL 2 MG PO TABS: 2.0000 mg | ORAL_TABLET | ORAL | Status: DC | PRN

## 2012-12-28 NOTE — Progress Notes (Signed)
  Radiation Oncology         [phone] ________________________________  Name: Christopher Roach MRN: [id number]  Date: 12/27/2012  DOB: 25-Aug-1930  RESPIRATORY MOTION MANAGEMENT SIMULATION  NARRATIVE:  In order to account for effect of respiratory motion on target structures and other organs in the planning and delivery of radiotherapy, this patient underwent respiratory motion management simulation.  To accomplish this, when the patient was brought to the CT simulation planning suite, 4D respiratoy motion management CT images were obtained.  The CT images were loaded into the planning software.  Then, using a variety of tools including Cine, MIP, and standard views, the target volume and clinical target volumes (CTV) were delineated.  Avoidance structures were contoured.  Treatment planning then occurred.  Dose volume histograms were generated and reviewed for each of the requested structure.    ------------------------------------------------  Radene Gunning, MD, PhD

## 2012-12-28 NOTE — Progress Notes (Signed)
  Radiation Oncology         [phone] ________________________________  Name: Christopher Roach MRN: [id number]  Date: 12/27/2012  DOB: [birth year]  SIMULATION AND TREATMENT PLANNING NOTE  DIAGNOSIS:  Lung cancer  NARRATIVE:  The patient was brought to the CT Simulation planning suite.  Identity was confirmed.  All relevant records and images related to the planned course of therapy were reviewed.   Written consent to proceed with treatment was confirmed which was freely given after reviewing the details related to the planned course of therapy had been reviewed with the patient.  Then, the patient was set-up in a stable reproducible supine position for radiation therapy.  The patient's arms were raised above using a wing board device. CT images were obtained.  An isocenter was placed within the chest in relation to the target volume. Surface markings were placed.    The CT images were loaded into the planning software.  Then the target and avoidance structures were contoured.  Treatment planning then occurred.  The radiation prescription was entered and confirmed.  A total of 4 complex treatment devices were fabricated which correspond to the designed customized radiation treatment fields. Each of these customized fields/ complex treatment devices will be used on a daily basis during the radiation course. I have requested a 3D Simulation.  I have requested a DVH of the following structures: target volume, spinal cord, lungs, esophagus.   PLAN:  The patient will initially receive 60 Gy in 30 fractions. It is anticipated that the patient will then received a 6 gray boost. The patient's final total dose will be 66 gray.   Special treatment procedure The patient will receive chemotherapy during the course of radiation treatment. The patient may experience increased or overlapping toxicity due to this combined-modality approach and the patient will be monitored for such problems. This may include extra  lab work as necessary. This therefore constitutes a special treatment procedure.   ________________________________   Radene Gunning, MD, PhD

## 2012-12-31 ENCOUNTER — Telehealth: Payer: Self-pay | Admitting: Internal Medicine

## 2012-12-31 ENCOUNTER — Ambulatory Visit (HOSPITAL_BASED_OUTPATIENT_CLINIC_OR_DEPARTMENT_OTHER): Payer: Medicare Other | Admitting: Physician Assistant

## 2012-12-31 ENCOUNTER — Encounter: Payer: Self-pay | Admitting: Physician Assistant

## 2012-12-31 ENCOUNTER — Other Ambulatory Visit (HOSPITAL_BASED_OUTPATIENT_CLINIC_OR_DEPARTMENT_OTHER): Payer: Medicare Other | Admitting: Lab

## 2012-12-31 ENCOUNTER — Ambulatory Visit
Admission: RE | Admit: 2012-12-31 | Discharge: 2012-12-31 | Disposition: A | Discharge status: Home / Self Care | Payer: Medicare Other | Source: Ambulatory Visit | Attending: Radiation Oncology | Admitting: Radiation Oncology

## 2012-12-31 VITALS — BP 123/60 | HR 62 | Temp 97.7°F | Resp 18 | Ht 68.0 in | Wt 192.2 lb

## 2012-12-31 DIAGNOSIS — C3490 Malignant neoplasm of unspecified part of unspecified bronchus or lung: Secondary | ICD-10-CM

## 2012-12-31 DIAGNOSIS — C3492 Malignant neoplasm of unspecified part of left bronchus or lung: Secondary | ICD-10-CM

## 2012-12-31 LAB — COMPREHENSIVE METABOLIC PANEL (CC13)
ALT: 27 U/L (ref 0–55)
AST: 27 U/L (ref 5–34)
Calcium: 9.1 mg/dL (ref 8.4–10.4)
Chloride: 107 mEq/L (ref 98–109)
Creatinine: 1.2 mg/dL (ref 0.7–1.3)
Potassium: 4.3 mEq/L (ref 3.5–5.1)

## 2012-12-31 LAB — CBC WITH DIFFERENTIAL/PLATELET
BASO%: 1.4 % (ref 0.0–2.0)
EOS%: 1.9 % (ref 0.0–7.0)
MCH: 30.5 pg (ref 27.2–33.4)
MCHC: 33.1 g/dL (ref 32.0–36.0)
NEUT%: 70.5 % (ref 39.0–75.0)
RDW: 14.1 % (ref 11.0–14.6)
lymph#: 1.5 10*3/uL (ref 0.9–3.3)

## 2012-12-31 NOTE — Patient Instructions (Addendum)
Continue with your course of concurrent chemoradiation and labs as scheduled Followup in 2 weeks for a symptom management visit

## 2012-12-31 NOTE — Telephone Encounter (Signed)
gve the pt his oct 2014 appt calendar. °

## 2012-12-31 NOTE — Progress Notes (Addendum)
No images are attached to the encounter. No scans are attached to the encounter. No scans are attached to the encounter. [facility] SHARED VISIT PROGRESS NOTE  [name], MD [address] [place] [address]  DIAGNOSIS: Stage IIIa (T1 B., N2, M0) non-small cell lung cancer, adenocarcinoma with negative EGFR mutation and negative ALK gene translocation  PRIOR THERAPY: None  CURRENT THERAPY: Concurrent chemoradiation with weekly carboplatin for an AUC of 2 and paclitaxel at 45 mg per meter squared concurrent with radiation therapy under the care of Dr. Mitzi Hansen  DISEASE STAGE: Stage IIIa (T1 B., N2, M0)  CHEMOTHERAPY INTENT: Control/curative  CURRENT # OF CHEMOTHERAPY CYCLES: 0  CURRENT ANTIEMETICS: Zofran, dexamethasone, and Compazine for at home use  CURRENT SMOKING STATUS: Former smoker quit 12/20/1992  ORAL CHEMOTHERAPY AND CONSENT: n/a  CURRENT BISPHOSPHONATES USE: None  PAIN MANAGEMENT: Dilaudid  NARCOTICS INDUCED CONSTIPATION: None  LIVING WILL AND CODE STATUS:    INTERVAL HISTORY: Christopher Roach 79 y.o. male returns for a scheduled regular office visit for followup of his recently diagnosed stage IIIa non-small cell lung cancer, adenocarcinoma. He is to begin his course of concurrent chemoradiation with first chemotherapy 01/01/2013. He presents with his daughter Christopher Roach. She states that she and her husband have been staying with Christopher Roach so that she can keep benign on them. She is cooking for him in making sure that he eats well. Overall Christopher Roach is feeling well with the exception of pain across his left anterior chest as well as some postsurgical pain his left VATS that was performed by Dr.  Laneta Simmers on 12/07/2012, however the mass in the upper lobe was directly invading the AP window and firmly adherent to the left pulmonary artery and the aorta. The mass was therefore unresectable.   MEDICAL HISTORY: Past Medical History  Diagnosis Date   . Hypertension   . Hyperlipidemia   . Hypothyroidism   . Abnormal LFTs   . Gallbladder polyp   . BPH (benign prostatic hyperplasia)   . Actinic keratosis   . Lung nodule     benign BX 03/24/2008  . Anemia   . H/O asbestos exposure   . Pneumonia     hx of walking PNA  . Shortness of breath     with exertion  . Constipation     from medications  . Arthritis   . Kidney stone August 2014    hx of  . Birth mark     left arm and shoulder; Dark purple on upper L chest ;NOT A RASH    ALLERGIES:  is allergic to codeine.  MEDICATIONS:  Current Outpatient Prescriptions  Medication Sig Dispense Refill  . amLODipine (NORVASC) 10 MG tablet Take 10 mg by mouth at bedtime.       Marland Kitchen aspirin EC 81 MG tablet Take 81 mg by mouth daily.      Marland Kitchen HYDROmorphone (DILAUDID) 2 MG tablet Take 1 tablet (2 mg total) by mouth every 4 (four) hours as needed for pain.  40 tablet  0  . levothyroxine (SYNTHROID, LEVOTHROID) 125 MCG tablet Take 125 mcg by mouth at bedtime.       . prochlorperazine (COMPAZINE) 10 MG tablet Take 1 tablet (10 mg total) by mouth every 6 (six) hours as needed.  60 tablet  0  . rosuvastatin (CRESTOR) 10 MG tablet Take 10 mg by mouth daily.      . tamsulosin (FLOMAX) 0.4 MG CAPS Take 0.4 mg by  mouth at bedtime.       Marland Kitchen zolpidem (AMBIEN) 10 MG tablet Take 10 mg by mouth at bedtime.        No current facility-administered medications for this visit.    SURGICAL HISTORY:  Past Surgical History  Procedure Laterality Date  . Back surgery      Dr Fannie Knee 1978 and 1982  . Cervical disc surgery    . Right foot surgery      plantar fascitis  . Right shoulder surgery      torn rotator cuff  . Right ankle fracture         . Right wrist fracture    . Foot surgery Left   . Tonsillectomy    . Appendectomy    . Colonoscopy    . Flexible bronchoscopy N/A 12/07/2012    Procedure: FLEXIBLE BRONCHOSCOPY;  Surgeon: [name], MD;  Location: [facility] OR;  Service: Thoracic;  Laterality: N/A;   . Video assisted thoracoscopy (vats)/thorocotomy Left 12/07/2012    Procedure: VIDEO ASSISTED THORACOSCOPY (VATS)/ EXPLORATORY THOROCOTOMY;  Surgeon: [name], MD;  Location: [facility] OR;  Service: Thoracic;  Laterality: Left;    REVIEW OF SYSTEMS:  A comprehensive review of systems was negative except for: Musculoskeletal: positive for bone pain   PHYSICAL EXAMINATION: General appearance: alert, cooperative, appears stated age and no distress Head: Normocephalic, without obvious abnormality, atraumatic Neck: no adenopathy, no carotid bruit, no JVD, supple, symmetrical, trachea midline and thyroid not enlarged, symmetric, no tenderness/mass/nodules Lymph nodes: Cervical, supraclavicular, and axillary nodes normal. Resp: clear to auscultation bilaterally Cardio: regular rate and rhythm, S1, S2 normal, no murmur, click, rub or gallop GI: soft, non-tender; bowel sounds normal; no masses,  no organomegaly Extremities: edema Trace pitting edema bilateral lower extremities, left greater than the right Neurologic: Alert and oriented X 3, normal strength and tone. Normal symmetric reflexes. Normal coordination and gait There is point tenderness in the left anterior ribs close to the sternal/ rib junctures, no overlying erythema or evidence of infection  ECOG PERFORMANCE STATUS: 1 - Symptomatic but completely ambulatory  Blood pressure 123/60, pulse 62, temperature 97.7 F (36.5 C), temperature source Oral, resp. rate 18, height 5\' 8"  (1.727 m), weight 192 lb 3.2 oz (87.181 kg), SpO2 100.00%.  LABORATORY DATA: Lab Results  Component Value Date   WBC 8.0 12/31/2012   HGB 12.0* 12/31/2012   HCT 36.2* 12/31/2012   MCV 92.1 12/31/2012   PLT 157 12/31/2012      Chemistry      Component Value Date/Time   NA 142 12/31/2012 1342   NA 132* 12/11/2012 0505   K 4.3 12/31/2012 1342   K 4.9 12/11/2012 0505   CL 97 12/11/2012 0505   CO2 26 12/31/2012 1342   CO2 28 12/11/2012 0505   BUN 13.3 12/31/2012 1342   BUN  22 12/11/2012 0505   CREATININE 1.2 12/31/2012 1342   CREATININE 1.25 12/11/2012 0505      Component Value Date/Time   CALCIUM 9.1 12/31/2012 1342   CALCIUM 8.7 12/11/2012 0505   ALKPHOS 163* 12/31/2012 1342   ALKPHOS 129* 12/05/2012 1348   AST 27 12/31/2012 1342   AST 44* 12/05/2012 1348   ALT 27 12/31/2012 1342   ALT 35 12/05/2012 1348   BILITOT 0.41 12/31/2012 1342   BILITOT 0.5 12/05/2012 1348       RADIOGRAPHIC STUDIES:  Dg Chest 2 View  12/19/2012   *RADIOLOGY REPORT*  Clinical Data: Postoperative evaluation after resection of lung  mass.  CHEST - 2 VIEW  Comparison: 12/11/2012 and 12/10/2012.  Findings: Stable volume loss in the left hemithorax.  No evidence for pneumothorax.  Left-sided pleural thickening towards the base is stable.  Subcutaneous emphysema and the basilar pneumothorax seen previously have resolved in the interval.  Right lung is hyperexpanded but clear. The cardiopericardial silhouette is enlarged.  Bones are diffusely demineralized.  IMPRESSION: Interval resolution of tiny left pneumothorax and subcutaneous emphysema.  Stable appearance of pleural thickening in the lower left hemithorax.   Original Report Authenticated By: Kennith Center, M.D.   Dg Chest 2 View  12/11/2012   *RADIOLOGY REPORT*  Clinical Data: Post left sided vats, evaluate for pneumothorax  CHEST - 2 VIEW  Comparison: 12/10/2012; 12/09/2012; 12/08/2012  Findings:  Grossly unchanged cardiac silhouette and mediastinal contours with mild tortuosity of the thoracic aorta.  There is persistent mild rightward tracheal deviation at the level of the aortic arch.  The lungs remain hyperexpanded.  Interval removal left sided chest tube with development of a tiny loculated left basilar hydropneumothorax.  No change to slight increase in amount of left lateral chest wall subcutaneous emphysema.  Left basilar heterogeneous opacity are grossly unchanged.  No new focal airspace opacity. No definite evidence of edema.  No definite  pleural effusion.  Unchanged bones.  A surgical clip overlies the left lower neck.  IMPRESSION: Interval removal of left-sided chest tube with development of a tiny left basilar hydropneumothorax.   Original Report Authenticated By: Tacey Ruiz, MD   Dg Chest 2 View  12/05/2012   *RADIOLOGY REPORT*  Clinical Data: Preop for lung mass.  CHEST - 2 VIEW  Comparison: Plain films 09/25/2012.  P E T 11/06/2012  Findings: There is tracheal deviation to the left, likely due to volume loss in the left hemithorax.  Mild cardiomegaly with a tortuous descending thoracic aorta.  Small left-sided pleural effusion versus thickening, chronic. No pneumothorax.  Moderate lower lobe predominant interstitial thickening.  Scarring at the left lung base.  The primary lesion, which abuts the left side of the mediastinum at the level of the AP window, is not readily apparent.  IMPRESSION: Interstitial thickening and left-sided pleural parenchymal scarring.  The primary lesion, in the medial left upper lobe, is not readily plain film apparent.   Original Report Authenticated By: Jeronimo Greaves, M.D.   Dg Chest Port 1 View  12/10/2012   *RADIOLOGY REPORT*  Clinical Data: Lung cancer status post left thoracotomy.  PORTABLE CHEST - 1 VIEW  Comparison: Chest x-ray 12/09/2012.  Findings: Left-sided chest tube remains in position with tip in the apex of the left hemithorax.  No appreciable left pneumothorax is identified at this time.  Extensive architectural distortion throughout the left lung likely represents some resolving postoperative edema/hemorrhage.  Right lung appears relatively clear.  Bibasilar opacities favored to reflect subsegmental atelectasis.  No evidence of pulmonary edema.  Heart size is borderline enlarged.  Mediastinal contours are within normal limits.  Atherosclerosis in the thoracic aorta.  IMPRESSION: 1.  Allowing for slight differences in patient positioning, the radiographic appearance of the chest is essentially  unchanged, as above.   Original Report Authenticated By: Trudie Reed, M.D.   Dg Chest Port 1 View  12/09/2012   *RADIOLOGY REPORT*  Clinical Data: Status post left thoracotomy  PORTABLE CHEST - 1 VIEW  Comparison: 12/08/2012  Findings: The heart and pulmonary vascularity are within normal limits.  The left-sided chest tube is again identified and stable. No pneumothorax is seen.  The right lung remains clear.  Patchy changes are identified in the left lung.  IMPRESSION: No pneumothorax.  Patchy changes in the left lung stable from prior exam.   Original Report Authenticated By: Alcide Clever, M.D.   Dg Chest Port 1 View  12/08/2012   *RADIOLOGY REPORT*  Clinical Data: Left chest tube.  PORTABLE CHEST - 1 VIEW  Comparison: Chest x-ray from yesterday.  Findings: Left-sided chest tube, extending over the apex.  There is been a modest increase in left chest subcutaneous emphysema.  No evident pneumothorax.  Unchanged atelectasis and pleural thickening at the left base.  Mild right basilar atelectasis.  Stable positioning of right IJ approach central venous catheter.  Unchanged upper mediastinal contours. Chronic cardiomegaly.  IMPRESSION:  1.  No definitive pneumothorax, although left chest subcutaneous emphysema has slightly increased.  Left chest tube in stable position. 2.  Left more than right base atelectasis, similar yesterday.   Original Report Authenticated By: Tiburcio Pea   Dg Chest Portable 1 View  12/07/2012   *RADIOLOGY REPORT*  Clinical Data: Status post surgery with central line placement and chest tube placement  PORTABLE CHEST - 1 VIEW  Comparison: 12/05/2012  Findings: A right-sided jugular line is noted in the mid superior vena cava.  A left the left-sided chest tube is seen new from prior exam.  Some atelectasis is noted in the left base.  No pneumothorax is noted bilaterally.  Cardiac shadow is stable.  IMPRESSION: Postsurgical changes are noted.  No acute abnormality is seen with  exception of atelectasis in the left lung base.  These results were called by telephone on 12/07/2012 at in 23 hours to [facility] the patient's nurse, who verbally acknowledged these results.   Original Report Authenticated By: Alcide Clever, M.D.   Dg Abd 2 Views  12/11/2012   *RADIOLOGY REPORT*  Clinical Data: Abdominal distension, evaluate for ileus  ABDOMEN - 2 VIEW  Comparison: PET CT - 11/06/2012; chest radiograph of earlier same day  Findings:  There is mild gaseous distension of several loops of small bowel within this loop within the left mid abdomen measuring approximately 3.7 cm in diameter.  This finding is associated with a paucity of bowel gas with small amount of air is seen within the hepatic flexure of the colon.  No definite pneumoperitoneum, pneumatosis or portal venous gas.  Limited visualization of the lower thorax demonstrates a residual tiny left basilar hydropneumothorax post recent left-sided chest tube removal.  There is a persistent small likely partially locular left-sided effusion.  There is a minimal amount of residual subcutaneous emphysema about the inferior left lateral chest wall.  Multilevel thoracolumbar are spine degenerative change.  IMPRESSION: 1. Nonspecific mild gaseous distension of several loops of small bowel with overall paucity of distal colonic gas.  While possibly representative of ileus, developing partial small bowel obstruction is not excluded.  Continued attention on follow up may be performed as clinically indicated. 2.  Limited visualization of the lower thorax demonstrates a persistent trace left-sided hydropneumothorax post recent chest tube removal.   Original Report Authenticated By: Tacey Ruiz, MD     ASSESSMENT/PLAN: Is a very pleasant 79 year-old white male recently diagnosed with stage IIIa (T1 B., N2, M0) non-small cell lung cancer adenocarcinoma with negative EGFR mutation and negative ALK gene translocation. He has an unresectable tumor. He is  currently undergoing a course of concurrent chemoradiation that will start on 01/01/2013. Patient was discussed with him also seen by Dr. Arbutus Ped. He  will proceed with this course of concurrent chemoradiation as scheduled. He'll return in 2 weeks for symptom management visit.     Laural Benes, Iyah Laguna E, PA-C     All questions were answered. The patient knows to call the clinic with any problems, questions or concerns. We can certainly see the patient much sooner if necessary.  ADDENDUM: Hematology/Oncology Attending: I have the face to face encounter with the patient today. I recommended his care plan. The patient came to the clinic today accompanied by his daughter for followup visit. He is feeling fine with no specific complaints. He is expected to start the first cycle of his concurrent chemotherapy tomorrow. He denied having any significant fever or chills, no nausea or vomiting. The patient denied having any significant chest pain but continues to have shortness breath with exertion. Will proceed with the first cycle of her systemic chemotherapy tomorrow as scheduled. The patient would come back for followup visit in 2 weeks for reevaluation and management any adverse effect of his chemotherapy. Lajuana Matte., MD 12/31/2012

## 2013-01-01 ENCOUNTER — Ambulatory Visit
Admission: RE | Admit: 2013-01-01 | Discharge: 2013-01-01 | Disposition: A | Discharge status: Home / Self Care | Payer: Medicare Other | Source: Ambulatory Visit | Attending: Radiation Oncology | Admitting: Radiation Oncology

## 2013-01-01 ENCOUNTER — Other Ambulatory Visit: Payer: Self-pay | Admitting: Internal Medicine

## 2013-01-01 ENCOUNTER — Ambulatory Visit (HOSPITAL_BASED_OUTPATIENT_CLINIC_OR_DEPARTMENT_OTHER): Payer: Medicare Other

## 2013-01-01 DIAGNOSIS — Z5111 Encounter for antineoplastic chemotherapy: Secondary | ICD-10-CM

## 2013-01-01 DIAGNOSIS — C3492 Malignant neoplasm of unspecified part of left bronchus or lung: Secondary | ICD-10-CM

## 2013-01-01 DIAGNOSIS — C3410 Malignant neoplasm of upper lobe, unspecified bronchus or lung: Secondary | ICD-10-CM

## 2013-01-01 MED ORDER — ONDANSETRON 16 MG/50ML IVPB (CHCC)
INTRAVENOUS | Status: AC
  Filled 2013-01-01: qty 16

## 2013-01-01 MED ORDER — SODIUM CHLORIDE 0.9 % IV SOLN
169.8000 mg | Freq: Once | INTRAVENOUS | Status: AC
  Administered 2013-01-01: 170 mg via INTRAVENOUS
  Filled 2013-01-01: qty 17

## 2013-01-01 MED ORDER — SODIUM CHLORIDE 0.9 % IV SOLN
Freq: Once | INTRAVENOUS | Status: AC
  Administered 2013-01-01: 12:00:00 via INTRAVENOUS

## 2013-01-01 MED ORDER — ONDANSETRON 16 MG/50ML IVPB (CHCC)
16.0000 mg | Freq: Once | INTRAVENOUS | Status: AC
  Administered 2013-01-01: 16 mg via INTRAVENOUS

## 2013-01-01 MED ORDER — DIPHENHYDRAMINE HCL 50 MG/ML IJ SOLN
INTRAMUSCULAR | Status: AC
  Filled 2013-01-01: qty 1

## 2013-01-01 MED ORDER — PACLITAXEL CHEMO INJECTION 300 MG/50ML
45.0000 mg/m2 | Freq: Once | INTRAVENOUS | Status: AC
  Administered 2013-01-01: 90 mg via INTRAVENOUS
  Filled 2013-01-01: qty 15

## 2013-01-01 MED ORDER — DEXAMETHASONE SODIUM PHOSPHATE 20 MG/5ML IJ SOLN
20.0000 mg | Freq: Once | INTRAMUSCULAR | Status: AC
  Administered 2013-01-01: 20 mg via INTRAVENOUS

## 2013-01-01 MED ORDER — DEXAMETHASONE SODIUM PHOSPHATE 20 MG/5ML IJ SOLN
INTRAMUSCULAR | Status: AC
  Filled 2013-01-01: qty 5

## 2013-01-01 MED ORDER — DIPHENHYDRAMINE HCL 50 MG/ML IJ SOLN
50.0000 mg | Freq: Once | INTRAMUSCULAR | Status: AC
  Administered 2013-01-01: 50 mg via INTRAVENOUS

## 2013-01-01 MED ORDER — FAMOTIDINE IN NACL 20-0.9 MG/50ML-% IV SOLN
20.0000 mg | Freq: Once | INTRAVENOUS | Status: AC
Start: 2013-01-01 — End: 2013-01-01
  Administered 2013-01-01: 20 mg via INTRAVENOUS

## 2013-01-01 MED ORDER — FAMOTIDINE IN NACL 20-0.9 MG/50ML-% IV SOLN
INTRAVENOUS | Status: AC
  Filled 2013-01-01: qty 50

## 2013-01-01 NOTE — Patient Instructions (Addendum)
[facility] Discharge Instructions for Patients Receiving Chemotherapy  Today you received the following chemotherapy agents: Taxol and Carboplatin.  To help prevent nausea and vomiting after your treatment, we encourage you to take your nausea medication as prescribed.   If you develop nausea and vomiting that is not controlled by your nausea medication, call the clinic.   BELOW ARE SYMPTOMS THAT SHOULD BE REPORTED IMMEDIATELY:  *FEVER GREATER THAN 100.5 F  *CHILLS WITH OR WITHOUT FEVER  NAUSEA AND VOMITING THAT IS NOT CONTROLLED WITH YOUR NAUSEA MEDICATION  *UNUSUAL SHORTNESS OF BREATH  *UNUSUAL BRUISING OR BLEEDING  TENDERNESS IN MOUTH AND THROAT WITH OR WITHOUT PRESENCE OF ULCERS  *URINARY PROBLEMS  *BOWEL PROBLEMS  UNUSUAL RASH Items with * indicate a potential emergency and should be followed up as soon as possible.  Feel free to call the clinic you have any questions or concerns. The clinic phone number is [phone].    

## 2013-01-02 ENCOUNTER — Ambulatory Visit
Admission: RE | Admit: 2013-01-02 | Discharge: 2013-01-02 | Disposition: A | Discharge status: Home / Self Care | Payer: Medicare Other | Source: Ambulatory Visit | Attending: Radiation Oncology | Admitting: Radiation Oncology

## 2013-01-02 ENCOUNTER — Telehealth: Payer: Self-pay | Admitting: *Deleted

## 2013-01-02 NOTE — Telephone Encounter (Signed)
Message copied by Augusto Garbe on Wed Jan 02, 2013 12:09 PM ------      Message from: Lenn Sink I      Created: Tue Jan 01, 2013  3:55 PM      Regarding: follow up call       First time Taxol and Carboplatin. No reaction. Dr. Arbutus Ped. Patient would like to be called late morning. Patient has radiation every day. Tomorrow is late morning.  ------

## 2013-01-02 NOTE — Telephone Encounter (Signed)
Called Christopher Roach for chemotherapy F/U.  Patient is doing well.  Completed his second RT treatment this morning.  Reports left shoulder pain he's experienced is a little better.  Sleeps great at night.  Denies n/v.  Denies any new side effects or symptoms.  Bowel and bladder is functioning well.  Eating and drinking well and I instructed to drink 64 oz minimum daily or at least the day before, of and after treatment.  Denies questions at this time and encouraged to call if needed.  Reviewed how to call after hours in the case of an emergency.

## 2013-01-03 ENCOUNTER — Ambulatory Visit
Admission: RE | Admit: 2013-01-03 | Discharge: 2013-01-03 | Disposition: A | Discharge status: Home / Self Care | Payer: Medicare Other | Source: Ambulatory Visit | Attending: Radiation Oncology | Admitting: Radiation Oncology

## 2013-01-04 ENCOUNTER — Ambulatory Visit
Admission: RE | Admit: 2013-01-04 | Discharge: 2013-01-04 | Disposition: A | Discharge status: Home / Self Care | Payer: Medicare Other | Source: Ambulatory Visit | Attending: Radiation Oncology | Admitting: Radiation Oncology

## 2013-01-04 ENCOUNTER — Encounter: Payer: Self-pay | Admitting: Radiation Oncology

## 2013-01-04 VITALS — BP 117/71 | HR 70 | Temp 98.2°F | Resp 20 | Wt 192.7 lb

## 2013-01-04 DIAGNOSIS — R911 Solitary pulmonary nodule: Secondary | ICD-10-CM

## 2013-01-04 MED ORDER — BIAFINE EX EMUL
CUTANEOUS | Status: DC | PRN
  Administered 2013-01-04: 14:00:00 via TOPICAL

## 2013-01-04 NOTE — Addendum Note (Signed)
Encounter addended by: Lowella Petties, RN on: 01/04/2013  1:47 PM<BR>     Documentation filed: Inpatient MAR

## 2013-01-04 NOTE — Progress Notes (Signed)
   Department of Radiation Oncology  Phone:  [phone] Fax:        [phone]  Weekly Treatment Note    Name: Christopher Roach Date: 01/04/2013 MRN: [id number] DOB: [birth year]   Current dose: 10 Gy  Current fraction: 5   MEDICATIONS: Current Outpatient Prescriptions  Medication Sig Dispense Refill  . amLODipine (NORVASC) 10 MG tablet Take 10 mg by mouth at bedtime.       Melene Muller ON 01/07/2013] emollient (BIAFINE) cream Apply 1 application topically daily.      Marland Kitchen HYDROmorphone (DILAUDID) 2 MG tablet Take 1 tablet (2 mg total) by mouth every 4 (four) hours as needed for pain.  40 tablet  0  . levothyroxine (SYNTHROID, LEVOTHROID) 125 MCG tablet Take 125 mcg by mouth at bedtime.       . prochlorperazine (COMPAZINE) 10 MG tablet Take 1 tablet (10 mg total) by mouth every 6 (six) hours as needed.  60 tablet  0  . rosuvastatin (CRESTOR) 10 MG tablet Take 10 mg by mouth daily.      . tamsulosin (FLOMAX) 0.4 MG CAPS Take 0.4 mg by mouth at bedtime.       Marland Kitchen zolpidem (AMBIEN) 10 MG tablet Take 10 mg by mouth at bedtime.       Marland Kitchen aspirin EC 81 MG tablet Take 81 mg by mouth daily.       Current Facility-Administered Medications  Medication Dose Route Frequency Provider Last Rate Last Dose  . topical emolient (BIAFINE) emulsion   Topical PRN Jonna Coup, MD         ALLERGIES: Codeine   LABORATORY DATA:  Lab Results  Component Value Date   WBC 8.0 12/31/2012   HGB 12.0* 12/31/2012   HCT 36.2* 12/31/2012   MCV 92.1 12/31/2012   PLT 157 12/31/2012   Lab Results  Component Value Date   NA 142 12/31/2012   K 4.3 12/31/2012   CL 97 12/11/2012   CO2 26 12/31/2012   Lab Results  Component Value Date   ALT 27 12/31/2012   AST 27 12/31/2012   ALKPHOS 163* 12/31/2012   BILITOT 0.41 12/31/2012     NARRATIVE: Christopher Roach was seen today for weekly treatment management. The chart was checked and the patient's films were reviewed. The patient did well with his first week of treatment.  No major difficulties.  PHYSICAL EXAMINATION: weight is 192 lb 11.2 oz (87.408 kg). His oral temperature is 98.2 F (36.8 C). His blood pressure is 117/71 and his pulse is 70. His respiration is 20 and oxygen saturation is 98%.        ASSESSMENT: The patient is doing satisfactorily with treatment.  PLAN: We will continue with the patient's radiation treatment as planned.

## 2013-01-04 NOTE — Progress Notes (Addendum)
Weekly rad tx l lung 5/30, patient education, coughing white pheglm, appetite good, toast sausage and eggs for breakfast,oj, to drink,teaching done, radiation therapy and you bpok, given, biafine cream with instructions to start using daily  When skin on chest starts getting irritated, erythema, all questions answered, some heart burn  At night before bedtime, no medication., left chest area c/o soreness, level 4-5 on 1-10 scale, takes dilaudid prn, had chemotherapy Tuesday 11:37 AM

## 2013-01-07 ENCOUNTER — Ambulatory Visit (HOSPITAL_BASED_OUTPATIENT_CLINIC_OR_DEPARTMENT_OTHER): Payer: Medicare Other

## 2013-01-07 ENCOUNTER — Other Ambulatory Visit (HOSPITAL_BASED_OUTPATIENT_CLINIC_OR_DEPARTMENT_OTHER): Payer: Medicare Other

## 2013-01-07 ENCOUNTER — Ambulatory Visit
Admission: RE | Admit: 2013-01-07 | Discharge: 2013-01-07 | Disposition: A | Discharge status: Home / Self Care | Payer: Medicare Other | Source: Ambulatory Visit | Attending: Radiation Oncology | Admitting: Radiation Oncology

## 2013-01-07 DIAGNOSIS — C3492 Malignant neoplasm of unspecified part of left bronchus or lung: Secondary | ICD-10-CM

## 2013-01-07 DIAGNOSIS — C3410 Malignant neoplasm of upper lobe, unspecified bronchus or lung: Secondary | ICD-10-CM

## 2013-01-07 DIAGNOSIS — Z5111 Encounter for antineoplastic chemotherapy: Secondary | ICD-10-CM

## 2013-01-07 LAB — CBC WITH DIFFERENTIAL/PLATELET
EOS%: 1.9 % (ref 0.0–7.0)
Eosinophils Absolute: 0.2 10*3/uL (ref 0.0–0.5)
HCT: 35.6 % — ABNORMAL LOW (ref 38.4–49.9)
LYMPH%: 17.1 % (ref 14.0–49.0)
MCH: 30 pg (ref 27.2–33.4)
MCV: 90.6 fL (ref 79.3–98.0)
MONO%: 7.3 % (ref 0.0–14.0)
NEUT#: 5.9 10*3/uL (ref 1.5–6.5)
Platelets: 178 10*3/uL (ref 140–400)
RBC: 3.93 10*6/uL — ABNORMAL LOW (ref 4.20–5.82)
nRBC: 0 % (ref 0–0)

## 2013-01-07 LAB — COMPREHENSIVE METABOLIC PANEL (CC13)
ALT: 22 U/L (ref 0–55)
Alkaline Phosphatase: 130 U/L (ref 40–150)
BUN: 13.1 mg/dL (ref 7.0–26.0)
CO2: 22 mEq/L (ref 22–29)
Calcium: 9.2 mg/dL (ref 8.4–10.4)
Chloride: 105 mEq/L (ref 98–109)
Creatinine: 1.2 mg/dL (ref 0.7–1.3)
Glucose: 124 mg/dl (ref 70–140)
Total Bilirubin: 0.5 mg/dL (ref 0.20–1.20)

## 2013-01-07 MED ORDER — ONDANSETRON 16 MG/50ML IVPB (CHCC)
16.0000 mg | Freq: Once | INTRAVENOUS | Status: AC
  Administered 2013-01-07: 16 mg via INTRAVENOUS

## 2013-01-07 MED ORDER — DIPHENHYDRAMINE HCL 50 MG/ML IJ SOLN
50.0000 mg | Freq: Once | INTRAMUSCULAR | Status: AC
  Administered 2013-01-07: 50 mg via INTRAVENOUS

## 2013-01-07 MED ORDER — DIPHENHYDRAMINE HCL 50 MG/ML IJ SOLN
INTRAMUSCULAR | Status: AC
  Filled 2013-01-07: qty 1

## 2013-01-07 MED ORDER — FAMOTIDINE IN NACL 20-0.9 MG/50ML-% IV SOLN
INTRAVENOUS | Status: AC
  Filled 2013-01-07: qty 50

## 2013-01-07 MED ORDER — PACLITAXEL CHEMO INJECTION 300 MG/50ML
45.0000 mg/m2 | Freq: Once | INTRAVENOUS | Status: AC
  Administered 2013-01-07: 90 mg via INTRAVENOUS
  Filled 2013-01-07: qty 15

## 2013-01-07 MED ORDER — DEXAMETHASONE SODIUM PHOSPHATE 20 MG/5ML IJ SOLN
20.0000 mg | Freq: Once | INTRAMUSCULAR | Status: AC
  Administered 2013-01-07: 20 mg via INTRAVENOUS

## 2013-01-07 MED ORDER — SODIUM CHLORIDE 0.9 % IV SOLN
Freq: Once | INTRAVENOUS | Status: AC
  Administered 2013-01-07: 14:00:00 via INTRAVENOUS

## 2013-01-07 MED ORDER — DEXAMETHASONE SODIUM PHOSPHATE 20 MG/5ML IJ SOLN
INTRAMUSCULAR | Status: AC
  Filled 2013-01-07: qty 5

## 2013-01-07 MED ORDER — FAMOTIDINE IN NACL 20-0.9 MG/50ML-% IV SOLN
20.0000 mg | Freq: Once | INTRAVENOUS | Status: AC
  Administered 2013-01-07: 20 mg via INTRAVENOUS

## 2013-01-07 MED ORDER — SODIUM CHLORIDE 0.9 % IV SOLN
169.8000 mg | Freq: Once | INTRAVENOUS | Status: AC
  Administered 2013-01-07: 170 mg via INTRAVENOUS
  Filled 2013-01-07: qty 17

## 2013-01-07 MED ORDER — ONDANSETRON 16 MG/50ML IVPB (CHCC)
INTRAVENOUS | Status: AC
  Filled 2013-01-07: qty 16

## 2013-01-07 NOTE — Patient Instructions (Addendum)
[facility] Discharge Instructions for Patients Receiving Chemotherapy  Today you received the following chemotherapy agents: Taxol, Carboplatin.  To help prevent nausea and vomiting after your treatment, we encourage you to take your nausea medication.   If you develop nausea and vomiting that is not controlled by your nausea medication, call the clinic.   BELOW ARE SYMPTOMS THAT SHOULD BE REPORTED IMMEDIATELY:  *FEVER GREATER THAN 100.5 F  *CHILLS WITH OR WITHOUT FEVER  NAUSEA AND VOMITING THAT IS NOT CONTROLLED WITH YOUR NAUSEA MEDICATION  *UNUSUAL SHORTNESS OF BREATH  *UNUSUAL BRUISING OR BLEEDING  TENDERNESS IN MOUTH AND THROAT WITH OR WITHOUT PRESENCE OF ULCERS  *URINARY PROBLEMS  *BOWEL PROBLEMS  UNUSUAL RASH Items with * indicate a potential emergency and should be followed up as soon as possible.  Feel free to call the clinic you have any questions or concerns. The clinic phone number is [phone].    

## 2013-01-08 ENCOUNTER — Ambulatory Visit
Admission: RE | Admit: 2013-01-08 | Discharge: 2013-01-08 | Disposition: A | Discharge status: Home / Self Care | Payer: Medicare Other | Source: Ambulatory Visit | Attending: Radiation Oncology | Admitting: Radiation Oncology

## 2013-01-09 ENCOUNTER — Ambulatory Visit
Admission: RE | Admit: 2013-01-09 | Discharge: 2013-01-09 | Disposition: A | Discharge status: Home / Self Care | Payer: Medicare Other | Source: Ambulatory Visit | Attending: Radiation Oncology | Admitting: Radiation Oncology

## 2013-01-10 ENCOUNTER — Ambulatory Visit
Admission: RE | Admit: 2013-01-10 | Discharge: 2013-01-10 | Disposition: A | Discharge status: Home / Self Care | Payer: Medicare Other | Source: Ambulatory Visit | Attending: Radiation Oncology | Admitting: Radiation Oncology

## 2013-01-11 ENCOUNTER — Encounter: Payer: Self-pay | Admitting: Radiation Oncology

## 2013-01-11 ENCOUNTER — Encounter: Payer: Self-pay | Admitting: Internal Medicine

## 2013-01-11 ENCOUNTER — Ambulatory Visit
Admission: RE | Admit: 2013-01-11 | Discharge: 2013-01-11 | Disposition: A | Discharge status: Home / Self Care | Payer: Medicare Other | Source: Ambulatory Visit | Attending: Radiation Oncology | Admitting: Radiation Oncology

## 2013-01-11 MED ORDER — ESZOPICLONE 2 MG PO TABS: 2.0000 mg | ORAL_TABLET | Freq: Every day | ORAL | Status: DC

## 2013-01-11 NOTE — Progress Notes (Signed)
Weekly rad txs chest 10/30 completd, cough up some phelgm at time,s main c/o not sleeping past 2 nights, ambien not helping at all, wants  Different new rx, appetite good, fatigued, "trying to get my affairs in order and my minds woun't let me sleep', pain mild in left side, still tender and sore where incision was slight erythmea on chest encouraged to start using biafine today  2:55 PM

## 2013-01-12 NOTE — Progress Notes (Signed)
   Department of Radiation Oncology  Phone:  [phone] Fax:        [phone]  Weekly Treatment Note    Name: SAMAR DASS Date: 01/12/2013 MRN: [id number] DOB: 08-20-1930   Current dose: 20 Gy  Current fraction: 10   MEDICATIONS: Current Outpatient Prescriptions  Medication Sig Dispense Refill  . amLODipine (NORVASC) 10 MG tablet Take 10 mg by mouth at bedtime.       Marland Kitchen emollient (BIAFINE) cream Apply 1 application topically daily.      . eszopiclone (LUNESTA) 2 MG TABS tablet Take 2 mg by mouth at bedtime. Take immediately before bedtime. Called into [facility] [address]      . HYDROmorphone (DILAUDID) 2 MG tablet Take 1 tablet (2 mg total) by mouth every 4 (four) hours as needed for pain.  40 tablet  0  . levothyroxine (SYNTHROID, LEVOTHROID) 125 MCG tablet Take 125 mcg by mouth at bedtime.       . prochlorperazine (COMPAZINE) 10 MG tablet Take 1 tablet (10 mg total) by mouth every 6 (six) hours as needed.  60 tablet  0  . rosuvastatin (CRESTOR) 10 MG tablet Take 10 mg by mouth daily.      . tamsulosin (FLOMAX) 0.4 MG CAPS Take 0.4 mg by mouth at bedtime.       Marland Kitchen zolpidem (AMBIEN) 10 MG tablet Take 10 mg by mouth at bedtime.       Marland Kitchen aspirin EC 81 MG tablet Take 81 mg by mouth daily.      . eszopiclone (LUNESTA) 2 MG TABS tablet Take 1 tablet (2 mg total) by mouth at bedtime. Take immediately before bedtime  30 tablet  0   No current facility-administered medications for this encounter.     ALLERGIES: Codeine   LABORATORY DATA:  Lab Results  Component Value Date   WBC 8.0 01/07/2013   HGB 11.8* 01/07/2013   HCT 35.6* 01/07/2013   MCV 90.6 01/07/2013   PLT 178 01/07/2013   Lab Results  Component Value Date   NA 138 01/07/2013   K 3.8 01/07/2013   CL 97 12/11/2012   CO2 22 01/07/2013   Lab Results  Component Value Date   ALT 22 01/07/2013   AST 26 01/07/2013   ALKPHOS 130 01/07/2013   BILITOT 0.50 01/07/2013     NARRATIVE: [name] was seen today for  weekly treatment management. The chart was checked and the patient's films were reviewed. The patient is doing well at this time. Good appetite. No chest discomfort. The patient's primary complaint is a lack of sleep. Ambien is not working for him for a well.  PHYSICAL EXAMINATION: weight is 189 lb 14.4 oz (86.138 kg). His oral temperature is 98.2 F (36.8 C). His blood pressure is 128/72 and his pulse is 66. His respiration is 20 and oxygen saturation is 98%.        ASSESSMENT: The patient is doing satisfactorily with treatment.  PLAN: We will continue with the patient's radiation treatment as planned. We will try Lunesta instead of Ambien. Otherwise continue without any changes.

## 2013-01-14 ENCOUNTER — Ambulatory Visit (HOSPITAL_BASED_OUTPATIENT_CLINIC_OR_DEPARTMENT_OTHER): Payer: Medicare Other | Admitting: Physician Assistant

## 2013-01-14 ENCOUNTER — Telehealth: Payer: Self-pay | Admitting: *Deleted

## 2013-01-14 ENCOUNTER — Ambulatory Visit (HOSPITAL_BASED_OUTPATIENT_CLINIC_OR_DEPARTMENT_OTHER): Payer: Medicare Other

## 2013-01-14 ENCOUNTER — Other Ambulatory Visit (HOSPITAL_BASED_OUTPATIENT_CLINIC_OR_DEPARTMENT_OTHER): Payer: Medicare Other | Admitting: Lab

## 2013-01-14 ENCOUNTER — Encounter: Payer: Self-pay | Admitting: Physician Assistant

## 2013-01-14 ENCOUNTER — Ambulatory Visit
Admission: RE | Admit: 2013-01-14 | Discharge: 2013-01-14 | Disposition: A | Discharge status: Home / Self Care | Payer: Medicare Other | Source: Ambulatory Visit | Attending: Radiation Oncology | Admitting: Radiation Oncology

## 2013-01-14 ENCOUNTER — Telehealth: Payer: Self-pay | Admitting: Internal Medicine

## 2013-01-14 ENCOUNTER — Other Ambulatory Visit: Payer: Self-pay | Admitting: Radiation Oncology

## 2013-01-14 DIAGNOSIS — C3492 Malignant neoplasm of unspecified part of left bronchus or lung: Secondary | ICD-10-CM

## 2013-01-14 DIAGNOSIS — Z5111 Encounter for antineoplastic chemotherapy: Secondary | ICD-10-CM

## 2013-01-14 DIAGNOSIS — C3410 Malignant neoplasm of upper lobe, unspecified bronchus or lung: Secondary | ICD-10-CM

## 2013-01-14 LAB — COMPREHENSIVE METABOLIC PANEL (CC13)
BUN: 13.2 mg/dL (ref 7.0–26.0)
CO2: 22 mEq/L (ref 22–29)
Creatinine: 0.9 mg/dL (ref 0.7–1.3)
Glucose: 96 mg/dl (ref 70–140)
Total Bilirubin: 0.38 mg/dL (ref 0.20–1.20)
Total Protein: 6.9 g/dL (ref 6.4–8.3)

## 2013-01-14 LAB — CBC WITH DIFFERENTIAL/PLATELET
Basophils Absolute: 0 10*3/uL (ref 0.0–0.1)
Eosinophils Absolute: 0.1 10*3/uL (ref 0.0–0.5)
HCT: 33 % — ABNORMAL LOW (ref 38.4–49.9)
HGB: 10.9 g/dL — ABNORMAL LOW (ref 13.0–17.1)
LYMPH%: 13.8 % — ABNORMAL LOW (ref 14.0–49.0)
MCHC: 33 g/dL (ref 32.0–36.0)
MONO#: 0.4 10*3/uL (ref 0.1–0.9)
NEUT#: 4.5 10*3/uL (ref 1.5–6.5)
NEUT%: 77 % — ABNORMAL HIGH (ref 39.0–75.0)
Platelets: 148 10*3/uL (ref 140–400)
WBC: 5.9 10*3/uL (ref 4.0–10.3)

## 2013-01-14 MED ORDER — DEXAMETHASONE SODIUM PHOSPHATE 20 MG/5ML IJ SOLN
20.0000 mg | Freq: Once | INTRAMUSCULAR | Status: AC
  Administered 2013-01-14: 20 mg via INTRAVENOUS

## 2013-01-14 MED ORDER — DEXAMETHASONE SODIUM PHOSPHATE 20 MG/5ML IJ SOLN
INTRAMUSCULAR | Status: AC
  Filled 2013-01-14: qty 5

## 2013-01-14 MED ORDER — PACLITAXEL CHEMO INJECTION 300 MG/50ML
45.0000 mg/m2 | Freq: Once | INTRAVENOUS | Status: AC
  Administered 2013-01-14: 90 mg via INTRAVENOUS
  Filled 2013-01-14: qty 15

## 2013-01-14 MED ORDER — DIPHENHYDRAMINE HCL 50 MG/ML IJ SOLN
50.0000 mg | Freq: Once | INTRAMUSCULAR | Status: AC
  Administered 2013-01-14: 50 mg via INTRAVENOUS

## 2013-01-14 MED ORDER — SODIUM CHLORIDE 0.9 % IV SOLN
Freq: Once | INTRAVENOUS | Status: AC
  Administered 2013-01-14: 15:00:00 via INTRAVENOUS

## 2013-01-14 MED ORDER — ONDANSETRON 16 MG/50ML IVPB (CHCC)
16.0000 mg | Freq: Once | INTRAVENOUS | Status: AC
  Administered 2013-01-14: 16 mg via INTRAVENOUS

## 2013-01-14 MED ORDER — ONDANSETRON 16 MG/50ML IVPB (CHCC)
INTRAVENOUS | Status: AC
  Filled 2013-01-14: qty 16

## 2013-01-14 MED ORDER — FAMOTIDINE IN NACL 20-0.9 MG/50ML-% IV SOLN
INTRAVENOUS | Status: AC
  Filled 2013-01-14: qty 50

## 2013-01-14 MED ORDER — ESZOPICLONE 2 MG PO TABS: 2.0000 mg | ORAL_TABLET | Freq: Every day | ORAL | Status: DC

## 2013-01-14 MED ORDER — DIPHENHYDRAMINE HCL 50 MG/ML IJ SOLN
INTRAMUSCULAR | Status: AC
  Filled 2013-01-14: qty 1

## 2013-01-14 MED ORDER — SODIUM CHLORIDE 0.9 % IV SOLN
169.8000 mg | Freq: Once | INTRAVENOUS | Status: AC
  Administered 2013-01-14: 170 mg via INTRAVENOUS
  Filled 2013-01-14: qty 17

## 2013-01-14 MED ORDER — FAMOTIDINE IN NACL 20-0.9 MG/50ML-% IV SOLN
20.0000 mg | Freq: Once | INTRAVENOUS | Status: AC
  Administered 2013-01-14: 20 mg via INTRAVENOUS

## 2013-01-14 NOTE — Patient Instructions (Addendum)
Continue your course of concurrent chemoradiation as scheduled Followup in 2 weeks 

## 2013-01-14 NOTE — Telephone Encounter (Signed)
Per staff message and POF I have scheduled appts.  JMW  

## 2013-01-14 NOTE — Progress Notes (Addendum)
No images are attached to the encounter. No scans are attached to the encounter. No scans are attached to the encounter. [facility] SHARED VISIT PROGRESS NOTE  [name], MD [address] [place] [address]  DIAGNOSIS: Stage IIIa (T1 B., N2, M0) non-small cell lung cancer, adenocarcinoma with negative EGFR mutation and negative ALK gene translocation  PRIOR THERAPY: None  CURRENT THERAPY: Concurrent chemoradiation with weekly carboplatin for an AUC of 2 and paclitaxel at 45 mg per meter squared concurrent with radiation therapy under the care of Dr. Mitzi Hansen. He status post 2 cycles of chemotherapy  DISEASE STAGE: Stage IIIa (T1 B., N2, M0)  CHEMOTHERAPY INTENT: Control/curative  CURRENT # OF CHEMOTHERAPY CYCLES: 3  CURRENT ANTIEMETICS: Zofran, dexamethasone, and Compazine for at home use  CURRENT SMOKING STATUS: Former smoker quit 12/20/1992  ORAL CHEMOTHERAPY AND CONSENT: n/a  CURRENT BISPHOSPHONATES USE: None  PAIN MANAGEMENT: Dilaudid  NARCOTICS INDUCED CONSTIPATION: None  LIVING WILL AND CODE STATUS: ?   INTERVAL HISTORY: Christopher Roach 79 y.o. male returns for a scheduled regular office visit for followup of his recently diagnosed stage IIIa non-small cell lung cancer, adenocarcinoma. He is tolerating his course of concurrent chemoradiation without difficulty. He presents with his daughter Christopher Roach. She states that she and her husband continue to stay with Christopher Roach so that she can keep an eye on him. He continues to eat and drink well. His daughter sees that he eats a well-balanced diet. He is having trouble sleeping and was recently prescribed Lunesta by Dr. Mitzi Hansen. They are just awaiting insurance coverage for this medication. He voiced no other specific complaints today.   MEDICAL HISTORY: Past Medical History  Diagnosis Date  . Hypertension   . Hyperlipidemia   . Hypothyroidism   . Abnormal LFTs   . Gallbladder polyp   . BPH (benign  prostatic hyperplasia)   . Actinic keratosis   . Lung nodule     benign BX 03/24/2008  . Anemia   . H/O asbestos exposure   . Pneumonia     hx of walking PNA  . Shortness of breath     with exertion  . Constipation     from medications  . Arthritis   . Kidney stone August 2014    hx of  . Birth mark     left arm and shoulder; Dark purple on upper L chest ;NOT A RASH    ALLERGIES:  is allergic to codeine.  MEDICATIONS:  Current Outpatient Prescriptions  Medication Sig Dispense Refill  . amLODipine (NORVASC) 10 MG tablet Take 10 mg by mouth at bedtime.       Marland Kitchen aspirin EC 81 MG tablet Take 81 mg by mouth daily.      Marland Kitchen emollient (BIAFINE) cream Apply 1 application topically daily.      . eszopiclone (LUNESTA) 2 MG TABS tablet Take 1 tablet (2 mg total) by mouth at bedtime. Take immediately before bedtime  30 tablet  0  . eszopiclone (LUNESTA) 2 MG TABS tablet Take 1 tablet (2 mg total) by mouth at bedtime. Take immediately before bedtime. Called into [facility] [address]  30 tablet  0  . HYDROmorphone (DILAUDID) 2 MG tablet Take 1 tablet (2 mg total) by mouth every 4 (four) hours as needed for pain.  40 tablet  0  . levothyroxine (SYNTHROID, LEVOTHROID) 125 MCG tablet Take 125 mcg by mouth at bedtime.       . prochlorperazine (COMPAZINE) 10  MG tablet Take 1 tablet (10 mg total) by mouth every 6 (six) hours as needed.  60 tablet  0  . rosuvastatin (CRESTOR) 10 MG tablet Take 10 mg by mouth daily.      . tamsulosin (FLOMAX) 0.4 MG CAPS Take 0.4 mg by mouth at bedtime.       Marland Kitchen zolpidem (AMBIEN) 10 MG tablet Take 10 mg by mouth at bedtime.        No current facility-administered medications for this visit.   Facility-Administered Medications Ordered in Other Visits  Medication Dose Route Frequency Provider Last Rate Last Dose  . 0.9 %  sodium chloride infusion   Intravenous Once Si Gaul, MD      . CARBOplatin (PARAPLATIN) 170 mg in sodium chloride 0.9 % 100 mL chemo  infusion  170 mg Intravenous Once Si Gaul, MD      . PACLitaxel (TAXOL) 90 mg in dextrose 5 % 250 mL chemo infusion (</= 80mg /m2)  45 mg/m2 (Treatment Plan Actual) Intravenous Once Si Gaul, MD 265 mL/hr at 01/14/13 1600 90 mg at 01/14/13 1600    SURGICAL HISTORY:  Past Surgical History  Procedure Laterality Date  . Back surgery      Dr Fannie Knee 1978 and 1982  . Cervical disc surgery    . Right foot surgery      plantar fascitis  . Right shoulder surgery      torn rotator cuff  . Right ankle fracture         . Right wrist fracture    . Foot surgery Left   . Tonsillectomy    . Appendectomy    . Colonoscopy    . Flexible bronchoscopy N/A 12/07/2012    Procedure: FLEXIBLE BRONCHOSCOPY;  Surgeon: [name], MD;  Location: [facility] OR;  Service: Thoracic;  Laterality: N/A;  . Video assisted thoracoscopy (vats)/thorocotomy Left 12/07/2012    Procedure: VIDEO ASSISTED THORACOSCOPY (VATS)/ EXPLORATORY THOROCOTOMY;  Surgeon: [name], MD;  Location: [facility] OR;  Service: Thoracic;  Laterality: Left;    REVIEW OF SYSTEMS:  A comprehensive review of systems was negative except for: Constitutional: positive for Insomnia   PHYSICAL EXAMINATION: General appearance: alert, cooperative, appears stated age and no distress Head: Normocephalic, without obvious abnormality, atraumatic Neck: no adenopathy, no carotid bruit, no JVD, supple, symmetrical, trachea midline and thyroid not enlarged, symmetric, no tenderness/mass/nodules Lymph nodes: Cervical, supraclavicular, and axillary nodes normal. Resp: clear to auscultation bilaterally Cardio: regular rate and rhythm, S1, S2 normal, no murmur, click, rub or gallop GI: soft, non-tender; bowel sounds normal; no masses,  no organomegaly Extremities: edema Trace pitting edema bilateral lower extremities, left greater than the right Neurologic: Alert and oriented X 3, normal strength and tone. Normal symmetric reflexes. Normal coordination and  gait  ECOG PERFORMANCE STATUS: 1 - Symptomatic but completely ambulatory  Blood pressure 114/57, pulse 57, temperature 97.9 F (36.6 C), temperature source Oral, resp. rate 18, height 5\' 8"  (1.727 m), weight 190 lb (86.183 kg), SpO2 98.00%.  LABORATORY DATA: Lab Results  Component Value Date   WBC 5.9 01/14/2013   HGB 10.9* 01/14/2013   HCT 33.0* 01/14/2013   MCV 91.2 01/14/2013   PLT 148 01/14/2013      Chemistry      Component Value Date/Time   NA 138 01/14/2013 1208   NA 132* 12/11/2012 0505   K 4.2 01/14/2013 1208   K 4.9 12/11/2012 0505   CL 97 12/11/2012 0505   CO2 22 01/14/2013 1208  CO2 28 12/11/2012 0505   BUN 13.2 01/14/2013 1208   BUN 22 12/11/2012 0505   CREATININE 0.9 01/14/2013 1208   CREATININE 1.25 12/11/2012 0505      Component Value Date/Time   CALCIUM 8.8 01/14/2013 1208   CALCIUM 8.7 12/11/2012 0505   ALKPHOS 91 01/14/2013 1208   ALKPHOS 129* 12/05/2012 1348   AST 18 01/14/2013 1208   AST 44* 12/05/2012 1348   ALT 18 01/14/2013 1208   ALT 35 12/05/2012 1348   BILITOT 0.38 01/14/2013 1208   BILITOT 0.5 12/05/2012 1348       RADIOGRAPHIC STUDIES:  Dg Chest 2 View  12/19/2012   *RADIOLOGY REPORT*  Clinical Data: Postoperative evaluation after resection of lung mass.  CHEST - 2 VIEW  Comparison: 12/11/2012 and 12/10/2012.  Findings: Stable volume loss in the left hemithorax.  No evidence for pneumothorax.  Left-sided pleural thickening towards the base is stable.  Subcutaneous emphysema and the basilar pneumothorax seen previously have resolved in the interval.  Right lung is hyperexpanded but clear. The cardiopericardial silhouette is enlarged.  Bones are diffusely demineralized.  IMPRESSION: Interval resolution of tiny left pneumothorax and subcutaneous emphysema.  Stable appearance of pleural thickening in the lower left hemithorax.   Original Report Authenticated By: Kennith Center, M.D.   Dg Chest 2 View  12/11/2012   *RADIOLOGY REPORT*  Clinical Data: Post left sided vats,  evaluate for pneumothorax  CHEST - 2 VIEW  Comparison: 12/10/2012; 12/09/2012; 12/08/2012  Findings:  Grossly unchanged cardiac silhouette and mediastinal contours with mild tortuosity of the thoracic aorta.  There is persistent mild rightward tracheal deviation at the level of the aortic arch.  The lungs remain hyperexpanded.  Interval removal left sided chest tube with development of a tiny loculated left basilar hydropneumothorax.  No change to slight increase in amount of left lateral chest wall subcutaneous emphysema.  Left basilar heterogeneous opacity are grossly unchanged.  No new focal airspace opacity. No definite evidence of edema.  No definite pleural effusion.  Unchanged bones.  A surgical clip overlies the left lower neck.  IMPRESSION: Interval removal of left-sided chest tube with development of a tiny left basilar hydropneumothorax.   Original Report Authenticated By: Tacey Ruiz, MD   Dg Chest 2 View  12/05/2012   *RADIOLOGY REPORT*  Clinical Data: Preop for lung mass.  CHEST - 2 VIEW  Comparison: Plain films 09/25/2012.  P E T 11/06/2012  Findings: There is tracheal deviation to the left, likely due to volume loss in the left hemithorax.  Mild cardiomegaly with a tortuous descending thoracic aorta.  Small left-sided pleural effusion versus thickening, chronic. No pneumothorax.  Moderate lower lobe predominant interstitial thickening.  Scarring at the left lung base.  The primary lesion, which abuts the left side of the mediastinum at the level of the AP window, is not readily apparent.  IMPRESSION: Interstitial thickening and left-sided pleural parenchymal scarring.  The primary lesion, in the medial left upper lobe, is not readily plain film apparent.   Original Report Authenticated By: Jeronimo Greaves, M.D.   Dg Chest Port 1 View  12/10/2012   *RADIOLOGY REPORT*  Clinical Data: Lung cancer status post left thoracotomy.  PORTABLE CHEST - 1 VIEW  Comparison: Chest x-ray 12/09/2012.  Findings:  Left-sided chest tube remains in position with tip in the apex of the left hemithorax.  No appreciable left pneumothorax is identified at this time.  Extensive architectural distortion throughout the left lung likely represents some resolving postoperative edema/hemorrhage.  Right lung appears relatively clear.  Bibasilar opacities favored to reflect subsegmental atelectasis.  No evidence of pulmonary edema.  Heart size is borderline enlarged.  Mediastinal contours are within normal limits.  Atherosclerosis in the thoracic aorta.  IMPRESSION: 1.  Allowing for slight differences in patient positioning, the radiographic appearance of the chest is essentially unchanged, as above.   Original Report Authenticated By: Trudie Reed, M.D.   Dg Chest Port 1 View  12/09/2012   *RADIOLOGY REPORT*  Clinical Data: Status post left thoracotomy  PORTABLE CHEST - 1 VIEW  Comparison: 12/08/2012  Findings: The heart and pulmonary vascularity are within normal limits.  The left-sided chest tube is again identified and stable. No pneumothorax is seen.  The right lung remains clear.  Patchy changes are identified in the left lung.  IMPRESSION: No pneumothorax.  Patchy changes in the left lung stable from prior exam.   Original Report Authenticated By: Alcide Clever, M.D.   Dg Chest Port 1 View  12/08/2012   *RADIOLOGY REPORT*  Clinical Data: Left chest tube.  PORTABLE CHEST - 1 VIEW  Comparison: Chest x-ray from yesterday.  Findings: Left-sided chest tube, extending over the apex.  There is been a modest increase in left chest subcutaneous emphysema.  No evident pneumothorax.  Unchanged atelectasis and pleural thickening at the left base.  Mild right basilar atelectasis.  Stable positioning of right IJ approach central venous catheter.  Unchanged upper mediastinal contours. Chronic cardiomegaly.  IMPRESSION:  1.  No definitive pneumothorax, although left chest subcutaneous emphysema has slightly increased.  Left chest tube in  stable position. 2.  Left more than right base atelectasis, similar yesterday.   Original Report Authenticated By: Tiburcio Pea   Dg Chest Portable 1 View  12/07/2012   *RADIOLOGY REPORT*  Clinical Data: Status post surgery with central line placement and chest tube placement  PORTABLE CHEST - 1 VIEW  Comparison: 12/05/2012  Findings: A right-sided jugular line is noted in the mid superior vena cava.  A left the left-sided chest tube is seen new from prior exam.  Some atelectasis is noted in the left base.  No pneumothorax is noted bilaterally.  Cardiac shadow is stable.  IMPRESSION: Postsurgical changes are noted.  No acute abnormality is seen with exception of atelectasis in the left lung base.  These results were called by telephone on 12/07/2012 at in 23 hours to [facility] the patient's nurse, who verbally acknowledged these results.   Original Report Authenticated By: Alcide Clever, M.D.   Dg Abd 2 Views  12/11/2012   *RADIOLOGY REPORT*  Clinical Data: Abdominal distension, evaluate for ileus  ABDOMEN - 2 VIEW  Comparison: PET CT - 11/06/2012; chest radiograph of earlier same day  Findings:  There is mild gaseous distension of several loops of small bowel within this loop within the left mid abdomen measuring approximately 3.7 cm in diameter.  This finding is associated with a paucity of bowel gas with small amount of air is seen within the hepatic flexure of the colon.  No definite pneumoperitoneum, pneumatosis or portal venous gas.  Limited visualization of the lower thorax demonstrates a residual tiny left basilar hydropneumothorax post recent left-sided chest tube removal.  There is a persistent small likely partially locular left-sided effusion.  There is a minimal amount of residual subcutaneous emphysema about the inferior left lateral chest wall.  Multilevel thoracolumbar are spine degenerative change.  IMPRESSION: 1. Nonspecific mild gaseous distension of several loops of small bowel with overall  paucity of  distal colonic gas.  While possibly representative of ileus, developing partial small bowel obstruction is not excluded.  Continued attention on follow up may be performed as clinically indicated. 2.  Limited visualization of the lower thorax demonstrates a persistent trace left-sided hydropneumothorax post recent chest tube removal.   Original Report Authenticated By: Tacey Ruiz, MD     ASSESSMENT/PLAN: Is a very pleasant 79 year-old white male recently diagnosed with stage IIIa (T1 B., N2, M0) non-small cell lung cancer adenocarcinoma with negative EGFR mutation and negative ALK gene translocation. He has an unresectable tumor. He is currently undergoing a course of concurrent chemoradiation.  Patient was discussed with him also seen by Dr. Arbutus Ped. He will proceed with this course of concurrent chemoradiation as scheduled. He'll return in 2 weeks for another symptom management visit.     Laural Benes, Ahmani Prehn E, PA-C     All questions were answered. The patient knows to call the clinic with any problems, questions or concerns. We can certainly see the patient much sooner if necessary.  ADDENDUM: Hematology/Oncology Attending: I have a face to face encounter with the patient. I recommended his care plan. He is a very pleasant 79 years old white male with stage IIIa non-small cell lung cancer currently undergoing concurrent chemoradiation with weekly carboplatin and paclitaxel is status post 2 cycles. The patient is tolerating his treatment fairly well with no significant adverse effects. I recommended for the patient to continue his current treatment as planned. He would come back for followup visit in 2 weeks for reevaluation and management any adverse effect of his treatment. Lajuana Matte., MD 01/15/2013

## 2013-01-14 NOTE — Telephone Encounter (Signed)
gv adn printed appt sched and avs for pt for OCT...emaield MW to add tx.    °

## 2013-01-14 NOTE — Patient Instructions (Addendum)
Cancer Center Discharge Instructions for Patients Receiving Chemotherapy  Today you received the following chemotherapy agents: taxol, carboplatin  To help prevent nausea and vomiting after your treatment, we encourage you to take your nausea medication.  Take it as often as prescribed.     If you develop nausea and vomiting that is not controlled by your nausea medication, call the clinic. If it is after clinic hours your family physician or the after hours number for the clinic or go to the Emergency Department.   BELOW ARE SYMPTOMS THAT SHOULD BE REPORTED IMMEDIATELY:  *FEVER GREATER THAN 100.5 F  *CHILLS WITH OR WITHOUT FEVER  NAUSEA AND VOMITING THAT IS NOT CONTROLLED WITH YOUR NAUSEA MEDICATION  *UNUSUAL SHORTNESS OF BREATH  *UNUSUAL BRUISING OR BLEEDING  TENDERNESS IN MOUTH AND THROAT WITH OR WITHOUT PRESENCE OF ULCERS  *URINARY PROBLEMS  *BOWEL PROBLEMS  UNUSUAL RASH Items with * indicate a potential emergency and should be followed up as soon as possible.  Feel free to call the clinic you have any questions or concerns. The clinic phone number is [phone].   I have been informed and understand all the instructions given to me. I know to contact the clinic, my physician, or go to the Emergency Department if any problems should occur. I do not have any questions at this time, but understand that I may call the clinic during office hours   should I have any questions or need assistance in obtaining follow up care.    __________________________________________  _____________  __________ Signature of Patient or Authorized Representative            Date                   Time    __________________________________________ Nurse's Signature    

## 2013-01-15 ENCOUNTER — Ambulatory Visit
Admission: RE | Admit: 2013-01-15 | Discharge: 2013-01-15 | Disposition: A | Discharge status: Home / Self Care | Payer: Medicare Other | Source: Ambulatory Visit | Attending: Radiation Oncology | Admitting: Radiation Oncology

## 2013-01-16 ENCOUNTER — Telehealth: Payer: Self-pay | Admitting: *Deleted

## 2013-01-16 ENCOUNTER — Encounter: Payer: Self-pay | Admitting: Radiation Oncology

## 2013-01-16 ENCOUNTER — Ambulatory Visit
Admission: RE | Admit: 2013-01-16 | Discharge: 2013-01-16 | Disposition: A | Discharge status: Home / Self Care | Payer: Medicare Other | Source: Ambulatory Visit | Attending: Radiation Oncology | Admitting: Radiation Oncology

## 2013-01-16 NOTE — Telephone Encounter (Signed)
Called at 0831 am-955am,  [phone] [employer] pre-authorization for [facility] rx of Eszopiclone 2mg  tablets 30/30 supply,(Lunesta), spoke with Zack,representative, this was approved #[id number], patient to call [phone] number to get form to be sent to him for reimbursement, will notify patient when he comes in for treatment today,  8:59 AM

## 2013-01-16 NOTE — Progress Notes (Signed)
Weekly rad txs chest 13/30 completed, no c/o pain, shoulder just tender now, coughing up very little sputum, clear, lunesta given the other day states'It's not helping much either, i keep waking up every couple hours and last night I woke myself up talking in my sleep" and singing 1x", , 98% room air  Mild fatigue slight erythema on front and back of left chest, using biafine bid, appetite good 2:06 PM

## 2013-01-16 NOTE — Telephone Encounter (Signed)
Called [phone] member services of [employer], spoke with Amy, she will mail Christopher Roach 3 forms which he should get in the mail 7-10 days, he is to fill out the form and attach the receipt of medication that he paid for andanswer the questions, mail back to them on back page of claim form ,then when they receive the form back it will be processed and will have $73.96 less his co pay of $10.00, from the $83.96, he can call them at [phone] for any questions or help with the claim forms, ,thanked represntative and will give information to the patient when he gets in, extra claim forms if in the future he needs to do this again, he is approved for this medication until 2015 11:40 AM

## 2013-01-16 NOTE — Telephone Encounter (Signed)
Called [facility] [phone] with pharmacist Marcelino Duster, informed her that Alfonso Patten rx has now been approved, for any future rx of this, cost to patient was$83.61, 9:05 AM

## 2013-01-16 NOTE — Progress Notes (Signed)
   Weekly Management Note:  outpatient Current Dose:  26 Gy  In 13 fractions  Narrative:  The patient presents for routine under treatment assessment.  CBCT/MVCT images/Port film x-rays were reviewed.  The chart was checked. No significant complaints. No esophagitis.  Physical Findings:   Vitals with Age-Percentiles 01/16/2013  Length   Systolic 126  Diastolic 52  Pulse 97  Respiration 20  Weight 86.818 kg  BMI   VISIT REPORT    NAD, well appearing.  CBC    Component Value Date/Time   WBC 5.9 01/14/2013 1207   WBC 8.0 12/11/2012 0505   RBC 3.62* 01/14/2013 1207   RBC 3.74* 12/11/2012 0505   HGB 10.9* 01/14/2013 1207   HGB 11.5* 12/11/2012 0505   HCT 33.0* 01/14/2013 1207   HCT 33.8* 12/11/2012 0505   PLT 148 01/14/2013 1207   PLT 171 12/11/2012 0505   MCV 91.2 01/14/2013 1207   MCV 90.4 12/11/2012 0505   MCH 30.1 01/14/2013 1207   MCH 30.7 12/11/2012 0505   MCHC 33.0 01/14/2013 1207   MCHC 34.0 12/11/2012 0505   RDW 14.2 01/14/2013 1207   RDW 14.0 12/11/2012 0505   LYMPHSABS 0.8* 01/14/2013 1207   MONOABS 0.4 01/14/2013 1207   EOSABS 0.1 01/14/2013 1207   BASOSABS 0.0 01/14/2013 1207     CMP     Component Value Date/Time   NA 138 01/14/2013 1208   NA 132* 12/11/2012 0505   K 4.2 01/14/2013 1208   K 4.9 12/11/2012 0505   CL 97 12/11/2012 0505   CO2 22 01/14/2013 1208   CO2 28 12/11/2012 0505   GLUCOSE 96 01/14/2013 1208   GLUCOSE 125* 12/11/2012 0505   BUN 13.2 01/14/2013 1208   BUN 22 12/11/2012 0505   CREATININE 0.9 01/14/2013 1208   CREATININE 1.25 12/11/2012 0505   CALCIUM 8.8 01/14/2013 1208   CALCIUM 8.7 12/11/2012 0505   PROT 6.9 01/14/2013 1208   PROT 7.7 12/05/2012 1348   ALBUMIN 3.3* 01/14/2013 1208   ALBUMIN 3.7 12/05/2012 1348   AST 18 01/14/2013 1208   AST 44* 12/05/2012 1348   ALT 18 01/14/2013 1208   ALT 35 12/05/2012 1348   ALKPHOS 91 01/14/2013 1208   ALKPHOS 129* 12/05/2012 1348   BILITOT 0.38 01/14/2013 1208   BILITOT 0.5 12/05/2012 1348   GFRNONAA 53* 12/11/2012 0505   GFRAA 61* 12/11/2012  0505     Impression:  The patient is tolerating radiotherapy.   Plan:  Continue radiotherapy as planned.    -----------------------------------  Lonie Peak, MD

## 2013-01-17 ENCOUNTER — Ambulatory Visit
Admission: RE | Admit: 2013-01-17 | Discharge: 2013-01-17 | Disposition: A | Discharge status: Home / Self Care | Payer: Medicare Other | Source: Ambulatory Visit | Attending: Radiation Oncology | Admitting: Radiation Oncology

## 2013-01-18 ENCOUNTER — Ambulatory Visit
Admission: RE | Admit: 2013-01-18 | Discharge: 2013-01-18 | Disposition: A | Discharge status: Home / Self Care | Payer: Medicare Other | Source: Ambulatory Visit | Attending: Radiation Oncology | Admitting: Radiation Oncology

## 2013-01-21 ENCOUNTER — Ambulatory Visit (HOSPITAL_BASED_OUTPATIENT_CLINIC_OR_DEPARTMENT_OTHER): Payer: Medicare Other

## 2013-01-21 ENCOUNTER — Ambulatory Visit
Admission: RE | Admit: 2013-01-21 | Discharge: 2013-01-21 | Disposition: A | Discharge status: Home / Self Care | Payer: Medicare Other | Source: Ambulatory Visit | Attending: Radiation Oncology | Admitting: Radiation Oncology

## 2013-01-21 ENCOUNTER — Other Ambulatory Visit (HOSPITAL_BASED_OUTPATIENT_CLINIC_OR_DEPARTMENT_OTHER): Payer: Medicare Other | Admitting: Lab

## 2013-01-21 ENCOUNTER — Other Ambulatory Visit: Payer: Self-pay | Admitting: Radiation Oncology

## 2013-01-21 ENCOUNTER — Encounter: Payer: Self-pay | Admitting: Radiation Oncology

## 2013-01-21 DIAGNOSIS — C3492 Malignant neoplasm of unspecified part of left bronchus or lung: Secondary | ICD-10-CM

## 2013-01-21 DIAGNOSIS — C3490 Malignant neoplasm of unspecified part of unspecified bronchus or lung: Secondary | ICD-10-CM

## 2013-01-21 DIAGNOSIS — Z5111 Encounter for antineoplastic chemotherapy: Secondary | ICD-10-CM

## 2013-01-21 DIAGNOSIS — C3410 Malignant neoplasm of upper lobe, unspecified bronchus or lung: Secondary | ICD-10-CM

## 2013-01-21 LAB — CBC WITH DIFFERENTIAL/PLATELET
EOS%: 1.9 % (ref 0.0–7.0)
Eosinophils Absolute: 0.1 10*3/uL (ref 0.0–0.5)
HCT: 35.2 % — ABNORMAL LOW (ref 38.4–49.9)
HGB: 11.7 g/dL — ABNORMAL LOW (ref 13.0–17.1)
LYMPH%: 15.6 % (ref 14.0–49.0)
MCH: 30.1 pg (ref 27.2–33.4)
MONO#: 0.5 10*3/uL (ref 0.1–0.9)
NEUT#: 3.8 10*3/uL (ref 1.5–6.5)
Platelets: 147 10*3/uL (ref 140–400)
RBC: 3.89 10*6/uL — ABNORMAL LOW (ref 4.20–5.82)
WBC: 5.3 10*3/uL (ref 4.0–10.3)
nRBC: 0 % (ref 0–0)

## 2013-01-21 LAB — COMPREHENSIVE METABOLIC PANEL (CC13)
ALT: 26 U/L (ref 0–55)
Alkaline Phosphatase: 93 U/L (ref 40–150)
Anion Gap: 8 mEq/L (ref 3–11)
CO2: 20 mEq/L — ABNORMAL LOW (ref 22–29)
Calcium: 9 mg/dL (ref 8.4–10.4)
Chloride: 110 mEq/L — ABNORMAL HIGH (ref 98–109)
Creatinine: 1 mg/dL (ref 0.7–1.3)
Total Protein: 7.4 g/dL (ref 6.4–8.3)

## 2013-01-21 MED ORDER — ONDANSETRON 16 MG/50ML IVPB (CHCC)
16.0000 mg | Freq: Once | INTRAVENOUS | Status: AC
  Administered 2013-01-21: 16 mg via INTRAVENOUS

## 2013-01-21 MED ORDER — MAGIC MOUTHWASH W/LIDOCAINE: ORAL | Status: DC

## 2013-01-21 MED ORDER — DEXAMETHASONE SODIUM PHOSPHATE 20 MG/5ML IJ SOLN
20.0000 mg | Freq: Once | INTRAMUSCULAR | Status: AC
  Administered 2013-01-21: 20 mg via INTRAVENOUS

## 2013-01-21 MED ORDER — DIPHENHYDRAMINE HCL 50 MG/ML IJ SOLN
50.0000 mg | Freq: Once | INTRAMUSCULAR | Status: AC
  Administered 2013-01-21: 50 mg via INTRAVENOUS

## 2013-01-21 MED ORDER — DIPHENHYDRAMINE HCL 50 MG/ML IJ SOLN
INTRAMUSCULAR | Status: AC
  Filled 2013-01-21: qty 1

## 2013-01-21 MED ORDER — ONDANSETRON 16 MG/50ML IVPB (CHCC)
INTRAVENOUS | Status: AC
  Filled 2013-01-21: qty 16

## 2013-01-21 MED ORDER — PACLITAXEL CHEMO INJECTION 300 MG/50ML
45.0000 mg/m2 | Freq: Once | INTRAVENOUS | Status: AC
  Administered 2013-01-21: 90 mg via INTRAVENOUS
  Filled 2013-01-21: qty 15

## 2013-01-21 MED ORDER — SODIUM CHLORIDE 0.9 % IV SOLN
Freq: Once | INTRAVENOUS | Status: AC
  Administered 2013-01-21: 14:00:00 via INTRAVENOUS

## 2013-01-21 MED ORDER — SODIUM CHLORIDE 0.9 % IV SOLN
169.8000 mg | Freq: Once | INTRAVENOUS | Status: AC
  Administered 2013-01-21: 170 mg via INTRAVENOUS
  Filled 2013-01-21: qty 17

## 2013-01-21 MED ORDER — FAMOTIDINE IN NACL 20-0.9 MG/50ML-% IV SOLN
20.0000 mg | Freq: Once | INTRAVENOUS | Status: AC
  Administered 2013-01-21: 20 mg via INTRAVENOUS

## 2013-01-21 MED ORDER — DEXAMETHASONE SODIUM PHOSPHATE 20 MG/5ML IJ SOLN
INTRAMUSCULAR | Status: AC
  Filled 2013-01-21: qty 5

## 2013-01-21 MED ORDER — FAMOTIDINE IN NACL 20-0.9 MG/50ML-% IV SOLN
INTRAVENOUS | Status: AC
  Filled 2013-01-21: qty 50

## 2013-01-21 NOTE — Patient Instructions (Signed)
[facility] Discharge Instructions for Patients Receiving Chemotherapy  Today you received the following chemotherapy agents: Taxol and Carboplatin.  To help prevent nausea and vomiting after your treatment, we encourage you to take your nausea medication as prescribed.   If you develop nausea and vomiting that is not controlled by your nausea medication, call the clinic.   BELOW ARE SYMPTOMS THAT SHOULD BE REPORTED IMMEDIATELY:  *FEVER GREATER THAN 100.5 F  *CHILLS WITH OR WITHOUT FEVER  NAUSEA AND VOMITING THAT IS NOT CONTROLLED WITH YOUR NAUSEA MEDICATION  *UNUSUAL SHORTNESS OF BREATH  *UNUSUAL BRUISING OR BLEEDING  TENDERNESS IN MOUTH AND THROAT WITH OR WITHOUT PRESENCE OF ULCERS  *URINARY PROBLEMS  *BOWEL PROBLEMS  UNUSUAL RASH Items with * indicate a potential emergency and should be followed up as soon as possible.  Feel free to call the clinic you have any questions or concerns. The clinic phone number is [phone].    

## 2013-01-21 NOTE — Progress Notes (Signed)
Patient reports to me in hallway that he is now starting to have esophagitis. Therefore, I Rx'd MMW with lidocaine for him and instructed him on how to use this.  -----------------------------------  Lonie Peak, MD

## 2013-01-22 ENCOUNTER — Ambulatory Visit
Admission: RE | Admit: 2013-01-22 | Discharge: 2013-01-22 | Disposition: A | Discharge status: Home / Self Care | Payer: Medicare Other | Source: Ambulatory Visit | Attending: Radiation Oncology | Admitting: Radiation Oncology

## 2013-01-23 ENCOUNTER — Ambulatory Visit
Admission: RE | Admit: 2013-01-23 | Discharge: 2013-01-23 | Disposition: A | Discharge status: Home / Self Care | Payer: Medicare Other | Source: Ambulatory Visit | Attending: Radiation Oncology | Admitting: Radiation Oncology

## 2013-01-24 ENCOUNTER — Ambulatory Visit
Admission: RE | Admit: 2013-01-24 | Discharge: 2013-01-24 | Disposition: A | Discharge status: Home / Self Care | Payer: Medicare Other | Source: Ambulatory Visit | Attending: Radiation Oncology | Admitting: Radiation Oncology

## 2013-01-25 ENCOUNTER — Encounter: Payer: Self-pay | Admitting: Radiation Oncology

## 2013-01-25 ENCOUNTER — Ambulatory Visit
Admission: RE | Admit: 2013-01-25 | Discharge: 2013-01-25 | Disposition: A | Discharge status: Home / Self Care | Payer: Medicare Other | Source: Ambulatory Visit | Attending: Radiation Oncology | Admitting: Radiation Oncology

## 2013-01-25 NOTE — Progress Notes (Signed)
Weekly rad tcs, lung, mild erythema on chest and back, skin intact, has birthmark purplish/red on left side chest, has difficulty swallowing certain foods, takes carafe,"that helps a little", coughs clear phelgm, Lunsesta isn't helping him sleep stated patient, energy level low 1:41 PM

## 2013-01-25 NOTE — Progress Notes (Signed)
   Department of Radiation Oncology  Phone:  [phone] Fax:        [phone]  Weekly Treatment Note    Name: Christopher Roach Date: 01/25/2013 MRN: [id number] DOB: [birth year]   Current dose: 40 Gy  Current fraction: 20   MEDICATIONS: Current Outpatient Prescriptions  Medication Sig Dispense Refill  . Alum & Mag Hydroxide-Simeth (MAGIC MOUTHWASH W/LIDOCAINE) SOLN 1part nystatin,1part Maaloxplus,1part benadryl,3part 2%viscous lidocaine. Swallow 10 mL up to QID, before meals/bedtime  480 mL  1  . amLODipine (NORVASC) 10 MG tablet Take 10 mg by mouth at bedtime.       Marland Kitchen aspirin EC 81 MG tablet Take 81 mg by mouth daily.      Marland Kitchen emollient (BIAFINE) cream Apply 1 application topically daily.      . eszopiclone (LUNESTA) 2 MG TABS tablet Take 1 tablet (2 mg total) by mouth at bedtime. Take immediately before bedtime  30 tablet  0  . eszopiclone (LUNESTA) 2 MG TABS tablet Take 1 tablet (2 mg total) by mouth at bedtime. Take immediately before bedtime. Called into [facility] [address]  30 tablet  0  . HYDROmorphone (DILAUDID) 2 MG tablet Take 1 tablet (2 mg total) by mouth every 4 (four) hours as needed for pain.  40 tablet  0  . levothyroxine (SYNTHROID, LEVOTHROID) 125 MCG tablet Take 125 mcg by mouth at bedtime.       . prochlorperazine (COMPAZINE) 10 MG tablet Take 1 tablet (10 mg total) by mouth every 6 (six) hours as needed.  60 tablet  0  . rosuvastatin (CRESTOR) 10 MG tablet Take 10 mg by mouth daily.      . tamsulosin (FLOMAX) 0.4 MG CAPS Take 0.4 mg by mouth at bedtime.       Marland Kitchen zolpidem (AMBIEN) 10 MG tablet Take 10 mg by mouth at bedtime.        No current facility-administered medications for this encounter.     ALLERGIES: Codeine   LABORATORY DATA:  Lab Results  Component Value Date   WBC 5.3 01/21/2013   HGB 11.7* 01/21/2013   HCT 35.2* 01/21/2013   MCV 90.5 01/21/2013   PLT 147 01/21/2013   Lab Results  Component Value Date   NA 139 01/21/2013   K 4.2 01/21/2013   CL 97 12/11/2012   CO2 20* 01/21/2013   Lab Results  Component Value Date   ALT 26 01/21/2013   AST 26 01/21/2013   ALKPHOS 93 01/21/2013   BILITOT 0.40 01/21/2013     NARRATIVE: Christopher Roach was seen today for weekly treatment management. The chart was checked and the patient's films were reviewed. The patient is doing well. Some esophagitis which is being managed with Carafate medication.  PHYSICAL EXAMINATION: weight is 188 lb 4.8 oz (85.412 kg). His oral temperature is 98.8 F (37.1 C). His blood pressure is 126/65 and his pulse is 84. His respiration is 20.      the patient's skin shows some mild irritation in the chest.  ASSESSMENT: The patient is doing satisfactorily with treatment.  PLAN: We will continue with the patient's radiation treatment as planned.

## 2013-01-28 ENCOUNTER — Ambulatory Visit (HOSPITAL_BASED_OUTPATIENT_CLINIC_OR_DEPARTMENT_OTHER): Payer: Medicare Other

## 2013-01-28 ENCOUNTER — Ambulatory Visit (HOSPITAL_BASED_OUTPATIENT_CLINIC_OR_DEPARTMENT_OTHER): Payer: Medicare Other | Admitting: Physician Assistant

## 2013-01-28 ENCOUNTER — Telehealth: Payer: Self-pay | Admitting: Internal Medicine

## 2013-01-28 ENCOUNTER — Other Ambulatory Visit (HOSPITAL_BASED_OUTPATIENT_CLINIC_OR_DEPARTMENT_OTHER): Payer: Medicare Other | Admitting: Lab

## 2013-01-28 ENCOUNTER — Other Ambulatory Visit: Payer: Medicare Other | Admitting: Lab

## 2013-01-28 ENCOUNTER — Ambulatory Visit
Admission: RE | Admit: 2013-01-28 | Discharge: 2013-01-28 | Disposition: A | Discharge status: Home / Self Care | Payer: Medicare Other | Source: Ambulatory Visit | Attending: Radiation Oncology | Admitting: Radiation Oncology

## 2013-01-28 ENCOUNTER — Telehealth: Payer: Self-pay | Admitting: *Deleted

## 2013-01-28 ENCOUNTER — Encounter: Payer: Self-pay | Admitting: Physician Assistant

## 2013-01-28 DIAGNOSIS — C3410 Malignant neoplasm of upper lobe, unspecified bronchus or lung: Secondary | ICD-10-CM

## 2013-01-28 DIAGNOSIS — Z5111 Encounter for antineoplastic chemotherapy: Secondary | ICD-10-CM

## 2013-01-28 DIAGNOSIS — C3492 Malignant neoplasm of unspecified part of left bronchus or lung: Secondary | ICD-10-CM

## 2013-01-28 LAB — CBC WITH DIFFERENTIAL/PLATELET
BASO%: 1.1 % (ref 0.0–2.0)
EOS%: 1.7 % (ref 0.0–7.0)
HGB: 12 g/dL — ABNORMAL LOW (ref 13.0–17.1)
MCH: 30.5 pg (ref 27.2–33.4)
MCHC: 33.6 g/dL (ref 32.0–36.0)
Platelets: 126 10*3/uL — ABNORMAL LOW (ref 140–400)
RBC: 3.93 10*6/uL — ABNORMAL LOW (ref 4.20–5.82)
RDW: 14.8 % — ABNORMAL HIGH (ref 11.0–14.6)
lymph#: 0.7 10*3/uL — ABNORMAL LOW (ref 0.9–3.3)
nRBC: 0 % (ref 0–0)

## 2013-01-28 LAB — COMPREHENSIVE METABOLIC PANEL (CC13)
ALT: 36 U/L (ref 0–55)
AST: 28 U/L (ref 5–34)
Albumin: 3.7 g/dL (ref 3.5–5.0)
Alkaline Phosphatase: 106 U/L (ref 40–150)
Anion Gap: 9 mEq/L (ref 3–11)
BUN: 14.4 mg/dL (ref 7.0–26.0)
CO2: 23 mEq/L (ref 22–29)
Calcium: 9.2 mg/dL (ref 8.4–10.4)
Chloride: 107 mEq/L (ref 98–109)
Creatinine: 1.1 mg/dL (ref 0.7–1.3)
Glucose: 94 mg/dl (ref 70–140)
Potassium: 4.2 mEq/L (ref 3.5–5.1)
Sodium: 139 mEq/L (ref 136–145)
Total Bilirubin: 0.46 mg/dL (ref 0.20–1.20)
Total Protein: 7.6 g/dL (ref 6.4–8.3)

## 2013-01-28 MED ORDER — DEXAMETHASONE SODIUM PHOSPHATE 20 MG/5ML IJ SOLN
INTRAMUSCULAR | Status: AC
  Filled 2013-01-28: qty 5

## 2013-01-28 MED ORDER — FAMOTIDINE IN NACL 20-0.9 MG/50ML-% IV SOLN
20.0000 mg | Freq: Once | INTRAVENOUS | Status: AC
  Administered 2013-01-28: 20 mg via INTRAVENOUS

## 2013-01-28 MED ORDER — SODIUM CHLORIDE 0.9 % IV SOLN
Freq: Once | INTRAVENOUS | Status: AC
  Administered 2013-01-28: 13:00:00 via INTRAVENOUS

## 2013-01-28 MED ORDER — PACLITAXEL CHEMO INJECTION 300 MG/50ML
45.0000 mg/m2 | Freq: Once | INTRAVENOUS | Status: AC
  Administered 2013-01-28: 90 mg via INTRAVENOUS
  Filled 2013-01-28: qty 15

## 2013-01-28 MED ORDER — ONDANSETRON 16 MG/50ML IVPB (CHCC)
INTRAVENOUS | Status: AC
  Filled 2013-01-28: qty 16

## 2013-01-28 MED ORDER — DIPHENHYDRAMINE HCL 50 MG/ML IJ SOLN
INTRAMUSCULAR | Status: AC
  Filled 2013-01-28: qty 1

## 2013-01-28 MED ORDER — FAMOTIDINE IN NACL 20-0.9 MG/50ML-% IV SOLN
INTRAVENOUS | Status: AC
  Filled 2013-01-28: qty 50

## 2013-01-28 MED ORDER — DIPHENHYDRAMINE HCL 50 MG/ML IJ SOLN
50.0000 mg | Freq: Once | INTRAMUSCULAR | Status: AC
  Administered 2013-01-28: 50 mg via INTRAVENOUS

## 2013-01-28 MED ORDER — ONDANSETRON 16 MG/50ML IVPB (CHCC)
16.0000 mg | Freq: Once | INTRAVENOUS | Status: AC
  Administered 2013-01-28: 16 mg via INTRAVENOUS

## 2013-01-28 MED ORDER — DEXAMETHASONE SODIUM PHOSPHATE 20 MG/5ML IJ SOLN
20.0000 mg | Freq: Once | INTRAMUSCULAR | Status: AC
  Administered 2013-01-28: 20 mg via INTRAVENOUS

## 2013-01-28 MED ORDER — SODIUM CHLORIDE 0.9 % IV SOLN
169.8000 mg | Freq: Once | INTRAVENOUS | Status: AC
  Administered 2013-01-28: 170 mg via INTRAVENOUS
  Filled 2013-01-28: qty 17

## 2013-01-28 NOTE — Telephone Encounter (Signed)
Per staff message and POF I have scheduled appts.  JMW  

## 2013-01-28 NOTE — Telephone Encounter (Signed)
gave pt appt for lab, Md and chemo for october and November 2014

## 2013-01-28 NOTE — Patient Instructions (Signed)
Continue your course of concurrent chemoradiation as scheduled Followup in 2 weeks 

## 2013-01-29 ENCOUNTER — Ambulatory Visit
Admission: RE | Admit: 2013-01-29 | Discharge: 2013-01-29 | Disposition: A | Discharge status: Home / Self Care | Payer: Medicare Other | Source: Ambulatory Visit | Attending: Radiation Oncology | Admitting: Radiation Oncology

## 2013-01-29 NOTE — Progress Notes (Addendum)
No images are attached to the encounter. No scans are attached to the encounter. No scans are attached to the encounter. [facility] SHARED VISIT PROGRESS NOTE  [name], MD [address] [place] [address]  DIAGNOSIS: Stage IIIa (T1 B., N2, M0) non-small cell lung cancer, adenocarcinoma with negative EGFR mutation and negative ALK gene translocation  PRIOR THERAPY: None  CURRENT THERAPY: Concurrent chemoradiation with weekly carboplatin for an AUC of 2 and paclitaxel at 45 mg per meter squared concurrent with radiation therapy under the care of Dr. Mitzi Hansen. He status post 4 cycles of chemotherapy  DISEASE STAGE: Stage IIIa (T1 B., N2, M0)  CHEMOTHERAPY INTENT: Control/curative  CURRENT # OF CHEMOTHERAPY CYCLES: 5  CURRENT ANTIEMETICS: Zofran, dexamethasone, and Compazine for at home use  CURRENT SMOKING STATUS: Former smoker quit 12/20/1992  ORAL CHEMOTHERAPY AND CONSENT: n/a  CURRENT BISPHOSPHONATES USE: None  PAIN MANAGEMENT: Dilaudid  NARCOTICS INDUCED CONSTIPATION: None  LIVING WILL AND CODE STATUS: ?   INTERVAL HISTORY: Christopher Roach 79 y.o. male returns for a scheduled regular office visit for followup of his recently diagnosed stage IIIa non-small cell lung cancer, adenocarcinoma. He is tolerating his course of concurrent chemoradiation without difficulty. He presents with his son-in-law.  He continues to eat and drink well. His daughter sees that he eats a well-balanced diet. He is having trouble sleeping and was recently prescribed Lunesta by Dr. Mitzi Hansen. He states that he did not tolerate this medication well. He was only able to sleep for 20 minutes and then had nightmares. He has discontinued this medication. He reports that he is an opportunity to go to [employer] this weekend and will discuss further with radiation oncology as it will require him to miss his fraction of radiation this Friday. His shortness of breath is at  baseline although he does voice some difficulty with swallowing. Radiation therapy is aware of this and has prescribed some Magic mouthwash with lidocaine to address this issue.  He voiced no other specific complaints today.   MEDICAL HISTORY: Past Medical History  Diagnosis Date  . Hypertension   . Hyperlipidemia   . Hypothyroidism   . Abnormal LFTs   . Gallbladder polyp   . BPH (benign prostatic hyperplasia)   . Actinic keratosis   . Lung nodule     benign BX 03/24/2008  . Anemia   . H/O asbestos exposure   . Pneumonia     hx of walking PNA  . Shortness of breath     with exertion  . Constipation     from medications  . Arthritis   . Kidney stone August 2014    hx of  . Birth mark     left arm and shoulder; Dark purple on upper L chest ;NOT A RASH    ALLERGIES:  is allergic to codeine.  MEDICATIONS:  Current Outpatient Prescriptions  Medication Sig Dispense Refill  . Alum & Mag Hydroxide-Simeth (MAGIC MOUTHWASH W/LIDOCAINE) SOLN 1part nystatin,1part Maaloxplus,1part benadryl,3part 2%viscous lidocaine. Swallow 10 mL up to QID, before meals/bedtime  480 mL  1  . amLODipine (NORVASC) 10 MG tablet Take 10 mg by mouth at bedtime.       Marland Kitchen aspirin EC 81 MG tablet Take 81 mg by mouth daily.      Marland Kitchen emollient (BIAFINE) cream Apply 1 application topically daily.      Marland Kitchen HYDROmorphone (DILAUDID) 2 MG tablet Take 1 tablet (2 mg total) by mouth every 4 (  four) hours as needed for pain.  40 tablet  0  . levothyroxine (SYNTHROID, LEVOTHROID) 125 MCG tablet Take 125 mcg by mouth at bedtime.       . prochlorperazine (COMPAZINE) 10 MG tablet Take 1 tablet (10 mg total) by mouth every 6 (six) hours as needed.  60 tablet  0  . rosuvastatin (CRESTOR) 10 MG tablet Take 10 mg by mouth daily.      . tamsulosin (FLOMAX) 0.4 MG CAPS Take 0.4 mg by mouth at bedtime.       Marland Kitchen zolpidem (AMBIEN) 10 MG tablet Take 10 mg by mouth at bedtime.        No current facility-administered medications for  this visit.    SURGICAL HISTORY:  Past Surgical History  Procedure Laterality Date  . Back surgery      Dr Fannie Knee 1978 and 1982  . Cervical disc surgery    . Right foot surgery      plantar fascitis  . Right shoulder surgery      torn rotator cuff  . Right ankle fracture         . Right wrist fracture    . Foot surgery Left   . Tonsillectomy    . Appendectomy    . Colonoscopy    . Flexible bronchoscopy N/A 12/07/2012    Procedure: FLEXIBLE BRONCHOSCOPY;  Surgeon: [name], MD;  Location: [facility] OR;  Service: Thoracic;  Laterality: N/A;  . Video assisted thoracoscopy (vats)/thorocotomy Left 12/07/2012    Procedure: VIDEO ASSISTED THORACOSCOPY (VATS)/ EXPLORATORY THOROCOTOMY;  Surgeon: [name], MD;  Location: [facility] OR;  Service: Thoracic;  Laterality: Left;    REVIEW OF SYSTEMS:  A comprehensive review of systems was negative except for: Constitutional: positive for Insomnia   PHYSICAL EXAMINATION: General appearance: alert, cooperative, appears stated age and no distress Head: Normocephalic, without obvious abnormality, atraumatic Neck: no adenopathy, no carotid bruit, no JVD, supple, symmetrical, trachea midline and thyroid not enlarged, symmetric, no tenderness/mass/nodules Lymph nodes: Cervical, supraclavicular, and axillary nodes normal. Resp: clear to auscultation bilaterally Cardio: regular rate and rhythm, S1, S2 normal, no murmur, click, rub or gallop GI: soft, non-tender; bowel sounds normal; no masses,  no organomegaly Extremities: edema Trace pitting edema bilateral lower extremities, left greater than the right Neurologic: Alert and oriented X 3, normal strength and tone. Normal symmetric reflexes. Normal coordination and gait  ECOG PERFORMANCE STATUS: 1 - Symptomatic but completely ambulatory  Blood pressure 129/69, pulse 71, temperature 96.8 F (36 C), temperature source Oral, resp. rate 20, height 5\' 8"  (1.727 m), weight 186 lb (84.369 kg).  LABORATORY  DATA: Lab Results  Component Value Date   WBC 5.2 01/28/2013   HGB 12.0* 01/28/2013   HCT 35.7* 01/28/2013   MCV 90.8 01/28/2013   PLT 126* 01/28/2013      Chemistry      Component Value Date/Time   NA 139 01/28/2013 1046   NA 132* 12/11/2012 0505   K 4.2 01/28/2013 1046   K 4.9 12/11/2012 0505   CL 97 12/11/2012 0505   CO2 23 01/28/2013 1046   CO2 28 12/11/2012 0505   BUN 14.4 01/28/2013 1046   BUN 22 12/11/2012 0505   CREATININE 1.1 01/28/2013 1046   CREATININE 1.25 12/11/2012 0505      Component Value Date/Time   CALCIUM 9.2 01/28/2013 1046   CALCIUM 8.7 12/11/2012 0505   ALKPHOS 106 01/28/2013 1046   ALKPHOS 129* 12/05/2012 1348   AST 28 01/28/2013 1046  AST 44* 12/05/2012 1348   ALT 36 01/28/2013 1046   ALT 35 12/05/2012 1348   BILITOT 0.46 01/28/2013 1046   BILITOT 0.5 12/05/2012 1348       RADIOGRAPHIC STUDIES:  Dg Chest 2 View  12/19/2012   *RADIOLOGY REPORT*  Clinical Data: Postoperative evaluation after resection of lung mass.  CHEST - 2 VIEW  Comparison: 12/11/2012 and 12/10/2012.  Findings: Stable volume loss in the left hemithorax.  No evidence for pneumothorax.  Left-sided pleural thickening towards the base is stable.  Subcutaneous emphysema and the basilar pneumothorax seen previously have resolved in the interval.  Right lung is hyperexpanded but clear. The cardiopericardial silhouette is enlarged.  Bones are diffusely demineralized.  IMPRESSION: Interval resolution of tiny left pneumothorax and subcutaneous emphysema.  Stable appearance of pleural thickening in the lower left hemithorax.   Original Report Authenticated By: Kennith Center, M.D.   Dg Chest 2 View  12/11/2012   *RADIOLOGY REPORT*  Clinical Data: Post left sided vats, evaluate for pneumothorax  CHEST - 2 VIEW  Comparison: 12/10/2012; 12/09/2012; 12/08/2012  Findings:  Grossly unchanged cardiac silhouette and mediastinal contours with mild tortuosity of the thoracic aorta.  There is persistent mild rightward  tracheal deviation at the level of the aortic arch.  The lungs remain hyperexpanded.  Interval removal left sided chest tube with development of a tiny loculated left basilar hydropneumothorax.  No change to slight increase in amount of left lateral chest wall subcutaneous emphysema.  Left basilar heterogeneous opacity are grossly unchanged.  No new focal airspace opacity. No definite evidence of edema.  No definite pleural effusion.  Unchanged bones.  A surgical clip overlies the left lower neck.  IMPRESSION: Interval removal of left-sided chest tube with development of a tiny left basilar hydropneumothorax.   Original Report Authenticated By: Tacey Ruiz, MD   Dg Chest 2 View  12/05/2012   *RADIOLOGY REPORT*  Clinical Data: Preop for lung mass.  CHEST - 2 VIEW  Comparison: Plain films 09/25/2012.  P E T 11/06/2012  Findings: There is tracheal deviation to the left, likely due to volume loss in the left hemithorax.  Mild cardiomegaly with a tortuous descending thoracic aorta.  Small left-sided pleural effusion versus thickening, chronic. No pneumothorax.  Moderate lower lobe predominant interstitial thickening.  Scarring at the left lung base.  The primary lesion, which abuts the left side of the mediastinum at the level of the AP window, is not readily apparent.  IMPRESSION: Interstitial thickening and left-sided pleural parenchymal scarring.  The primary lesion, in the medial left upper lobe, is not readily plain film apparent.   Original Report Authenticated By: Jeronimo Greaves, M.D.   Dg Chest Port 1 View  12/10/2012   *RADIOLOGY REPORT*  Clinical Data: Lung cancer status post left thoracotomy.  PORTABLE CHEST - 1 VIEW  Comparison: Chest x-ray 12/09/2012.  Findings: Left-sided chest tube remains in position with tip in the apex of the left hemithorax.  No appreciable left pneumothorax is identified at this time.  Extensive architectural distortion throughout the left lung likely represents some resolving  postoperative edema/hemorrhage.  Right lung appears relatively clear.  Bibasilar opacities favored to reflect subsegmental atelectasis.  No evidence of pulmonary edema.  Heart size is borderline enlarged.  Mediastinal contours are within normal limits.  Atherosclerosis in the thoracic aorta.  IMPRESSION: 1.  Allowing for slight differences in patient positioning, the radiographic appearance of the chest is essentially unchanged, as above.   Original Report Authenticated By: Reuel Boom  Entrikin, M.D.   Dg Chest Port 1 View  12/09/2012   *RADIOLOGY REPORT*  Clinical Data: Status post left thoracotomy  PORTABLE CHEST - 1 VIEW  Comparison: 12/08/2012  Findings: The heart and pulmonary vascularity are within normal limits.  The left-sided chest tube is again identified and stable. No pneumothorax is seen.  The right lung remains clear.  Patchy changes are identified in the left lung.  IMPRESSION: No pneumothorax.  Patchy changes in the left lung stable from prior exam.   Original Report Authenticated By: Alcide Clever, M.D.   Dg Chest Port 1 View  12/08/2012   *RADIOLOGY REPORT*  Clinical Data: Left chest tube.  PORTABLE CHEST - 1 VIEW  Comparison: Chest x-ray from yesterday.  Findings: Left-sided chest tube, extending over the apex.  There is been a modest increase in left chest subcutaneous emphysema.  No evident pneumothorax.  Unchanged atelectasis and pleural thickening at the left base.  Mild right basilar atelectasis.  Stable positioning of right IJ approach central venous catheter.  Unchanged upper mediastinal contours. Chronic cardiomegaly.  IMPRESSION:  1.  No definitive pneumothorax, although left chest subcutaneous emphysema has slightly increased.  Left chest tube in stable position. 2.  Left more than right base atelectasis, similar yesterday.   Original Report Authenticated By: Tiburcio Pea   Dg Chest Portable 1 View  12/07/2012   *RADIOLOGY REPORT*  Clinical Data: Status post surgery with central line  placement and chest tube placement  PORTABLE CHEST - 1 VIEW  Comparison: 12/05/2012  Findings: A right-sided jugular line is noted in the mid superior vena cava.  A left the left-sided chest tube is seen new from prior exam.  Some atelectasis is noted in the left base.  No pneumothorax is noted bilaterally.  Cardiac shadow is stable.  IMPRESSION: Postsurgical changes are noted.  No acute abnormality is seen with exception of atelectasis in the left lung base.  These results were called by telephone on 12/07/2012 at in 23 hours to [facility] the patient's nurse, who verbally acknowledged these results.   Original Report Authenticated By: Alcide Clever, M.D.   Dg Abd 2 Views  12/11/2012   *RADIOLOGY REPORT*  Clinical Data: Abdominal distension, evaluate for ileus  ABDOMEN - 2 VIEW  Comparison: PET CT - 11/06/2012; chest radiograph of earlier same day  Findings:  There is mild gaseous distension of several loops of small bowel within this loop within the left mid abdomen measuring approximately 3.7 cm in diameter.  This finding is associated with a paucity of bowel gas with small amount of air is seen within the hepatic flexure of the colon.  No definite pneumoperitoneum, pneumatosis or portal venous gas.  Limited visualization of the lower thorax demonstrates a residual tiny left basilar hydropneumothorax post recent left-sided chest tube removal.  There is a persistent small likely partially locular left-sided effusion.  There is a minimal amount of residual subcutaneous emphysema about the inferior left lateral chest wall.  Multilevel thoracolumbar are spine degenerative change.  IMPRESSION: 1. Nonspecific mild gaseous distension of several loops of small bowel with overall paucity of distal colonic gas.  While possibly representative of ileus, developing partial small bowel obstruction is not excluded.  Continued attention on follow up may be performed as clinically indicated. 2.  Limited visualization of the lower  thorax demonstrates a persistent trace left-sided hydropneumothorax post recent chest tube removal.   Original Report Authenticated By: Tacey Ruiz, MD     ASSESSMENT/PLAN: Is a very pleasant  79 year-old white male recently diagnosed with stage IIIa (T1 B., N2, M0) non-small cell lung cancer adenocarcinoma with negative EGFR mutation and negative ALK gene translocation. He has an unresectable tumor. He is currently undergoing a course of concurrent chemoradiation.  Patient was discussed with and also seen by Dr. Arbutus Ped. He will proceed with this course of concurrent chemoradiation as scheduled. He'll return in 2 weeks for another symptom management visit.     Laural Benes, Norma Montemurro E, PA-C     All questions were answered. The patient knows to call the clinic with any problems, questions or concerns. We can certainly see the patient much sooner if necessary.  ADDENDUM Hematology/Oncology Attending: I had a face to face encounter with the patient. I recommended his care plan. This is a very pleasant 79 years old white male recently diagnosed with a stage IIIa non-small cell lung cancer currently undergoing concurrent chemoradiation with weekly carboplatin and paclitaxel is status post 4 cycles. The patient is tolerating his treatment fairly well with no significant adverse effects. I recommended for him to proceed with the last 2 doses of his treatment as scheduled. He would come back for followup visit in 2 weeks for evaluation and management any adverse effect of his treatment. The patient was advised to call immediately if he has any concerning symptoms in the interval. Lajuana Matte., MD 01/30/2013

## 2013-01-30 ENCOUNTER — Ambulatory Visit
Admission: RE | Admit: 2013-01-30 | Discharge: 2013-01-30 | Disposition: A | Discharge status: Home / Self Care | Payer: Medicare Other | Source: Ambulatory Visit | Attending: Radiation Oncology | Admitting: Radiation Oncology

## 2013-01-31 ENCOUNTER — Encounter: Payer: Self-pay | Admitting: Radiation Oncology

## 2013-01-31 ENCOUNTER — Ambulatory Visit
Admission: RE | Admit: 2013-01-31 | Discharge: 2013-01-31 | Disposition: A | Discharge status: Home / Self Care | Payer: Medicare Other | Source: Ambulatory Visit | Attending: Radiation Oncology | Admitting: Radiation Oncology

## 2013-01-31 NOTE — Progress Notes (Signed)
   Department of Radiation Oncology  Phone:  [phone] Fax:        [phone]  Weekly Treatment Note    Name: Christopher Roach Date: 01/31/2013 MRN: [id number] DOB: 04/25/1930   Current dose: 48 Gy  Current fraction: 24   MEDICATIONS: Current Outpatient Prescriptions  Medication Sig Dispense Refill  . Alum & Mag Hydroxide-Simeth (MAGIC MOUTHWASH W/LIDOCAINE) SOLN 1part nystatin,1part Maaloxplus,1part benadryl,3part 2%viscous lidocaine. Swallow 10 mL up to QID, before meals/bedtime  480 mL  1  . amLODipine (NORVASC) 10 MG tablet Take 10 mg by mouth at bedtime.       Marland Kitchen aspirin EC 81 MG tablet Take 81 mg by mouth daily.      Marland Kitchen emollient (BIAFINE) cream Apply 1 application topically daily.      Marland Kitchen HYDROmorphone (DILAUDID) 2 MG tablet Take 1 tablet (2 mg total) by mouth every 4 (four) hours as needed for pain.  40 tablet  0  . levothyroxine (SYNTHROID, LEVOTHROID) 125 MCG tablet Take 125 mcg by mouth at bedtime.       . prochlorperazine (COMPAZINE) 10 MG tablet Take 1 tablet (10 mg total) by mouth every 6 (six) hours as needed.  60 tablet  0  . rosuvastatin (CRESTOR) 10 MG tablet Take 10 mg by mouth daily.      . tamsulosin (FLOMAX) 0.4 MG CAPS Take 0.4 mg by mouth at bedtime.       Marland Kitchen zolpidem (AMBIEN) 10 MG tablet Take 10 mg by mouth at bedtime.        No current facility-administered medications for this encounter.     ALLERGIES: Codeine   LABORATORY DATA:  Lab Results  Component Value Date   WBC 5.2 01/28/2013   HGB 12.0* 01/28/2013   HCT 35.7* 01/28/2013   MCV 90.8 01/28/2013   PLT 126* 01/28/2013   Lab Results  Component Value Date   NA 139 01/28/2013   K 4.2 01/28/2013   CL 97 12/11/2012   CO2 23 01/28/2013   Lab Results  Component Value Date   ALT 36 01/28/2013   AST 28 01/28/2013   ALKPHOS 106 01/28/2013   BILITOT 0.46 01/28/2013     NARRATIVE: Christopher Roach was seen today for weekly treatment management. The chart was checked and the  patient's films were reviewed. The patient is doing well this week. Some occasional esophagitis but Carafate medication is working well for this. He is using skin cream twice a day and states that this also is working very well.  PHYSICAL EXAMINATION: weight is 185 lb 4.8 oz (84.052 kg). His oral temperature is 98.4 F (36.9 C). His blood pressure is 142/68 and his pulse is 80. His respiration is 20 and oxygen saturation is 97%.        ASSESSMENT: The patient is doing satisfactorily with treatment.  PLAN: We will continue with the patient's radiation treatment as planned.

## 2013-01-31 NOTE — Progress Notes (Signed)
Weekly rad tx lung, 24/30 using biafine cream bid from=nt and baqck of chest, chemotherapy on this past Monday, no c/o pain in his shoulder,  Says he thinks this radiation therapy is working, appetite fauir, lost 3 lbs, energylevel low 1:43 PM

## 2013-02-01 ENCOUNTER — Ambulatory Visit
Admission: RE | Admit: 2013-02-01 | Discharge: 2013-02-01 | Disposition: A | Discharge status: Home / Self Care | Payer: Medicare Other | Source: Ambulatory Visit | Attending: Radiation Oncology | Admitting: Radiation Oncology

## 2013-02-04 ENCOUNTER — Other Ambulatory Visit (HOSPITAL_BASED_OUTPATIENT_CLINIC_OR_DEPARTMENT_OTHER): Payer: Medicare Other | Admitting: Lab

## 2013-02-04 ENCOUNTER — Ambulatory Visit (HOSPITAL_BASED_OUTPATIENT_CLINIC_OR_DEPARTMENT_OTHER): Payer: Medicare Other

## 2013-02-04 ENCOUNTER — Ambulatory Visit
Admission: RE | Admit: 2013-02-04 | Discharge: 2013-02-04 | Disposition: A | Discharge status: Home / Self Care | Payer: Medicare Other | Source: Ambulatory Visit | Attending: Radiation Oncology | Admitting: Radiation Oncology

## 2013-02-04 DIAGNOSIS — C3492 Malignant neoplasm of unspecified part of left bronchus or lung: Secondary | ICD-10-CM

## 2013-02-04 DIAGNOSIS — Z5111 Encounter for antineoplastic chemotherapy: Secondary | ICD-10-CM

## 2013-02-04 DIAGNOSIS — C3410 Malignant neoplasm of upper lobe, unspecified bronchus or lung: Secondary | ICD-10-CM

## 2013-02-04 LAB — COMPREHENSIVE METABOLIC PANEL (CC13)
Albumin: 3.7 g/dL (ref 3.5–5.0)
Alkaline Phosphatase: 89 U/L (ref 40–150)
BUN: 10.3 mg/dL (ref 7.0–26.0)
Calcium: 9 mg/dL (ref 8.4–10.4)
Chloride: 108 mEq/L (ref 98–109)
Glucose: 85 mg/dl (ref 70–140)
Potassium: 3.7 mEq/L (ref 3.5–5.1)

## 2013-02-04 LAB — CBC WITH DIFFERENTIAL/PLATELET
Basophils Absolute: 0.1 10*3/uL (ref 0.0–0.1)
EOS%: 1.8 % (ref 0.0–7.0)
HCT: 32.2 % — ABNORMAL LOW (ref 38.4–49.9)
HGB: 11 g/dL — ABNORMAL LOW (ref 13.0–17.1)
LYMPH%: 12.5 % — ABNORMAL LOW (ref 14.0–49.0)
MCH: 30.7 pg (ref 27.2–33.4)
MCV: 89.9 fL (ref 79.3–98.0)
NEUT#: 3.3 10*3/uL (ref 1.5–6.5)
NEUT%: 72.8 % (ref 39.0–75.0)
Platelets: 104 10*3/uL — ABNORMAL LOW (ref 140–400)
RBC: 3.58 10*6/uL — ABNORMAL LOW (ref 4.20–5.82)
lymph#: 0.6 10*3/uL — ABNORMAL LOW (ref 0.9–3.3)
nRBC: 0 % (ref 0–0)

## 2013-02-04 MED ORDER — DEXAMETHASONE SODIUM PHOSPHATE 20 MG/5ML IJ SOLN
INTRAMUSCULAR | Status: AC
  Filled 2013-02-04: qty 5

## 2013-02-04 MED ORDER — SODIUM CHLORIDE 0.9 % IV SOLN
Freq: Once | INTRAVENOUS | Status: AC
  Administered 2013-02-04: 13:00:00 via INTRAVENOUS

## 2013-02-04 MED ORDER — FAMOTIDINE IN NACL 20-0.9 MG/50ML-% IV SOLN
INTRAVENOUS | Status: AC
  Filled 2013-02-04: qty 50

## 2013-02-04 MED ORDER — ONDANSETRON 16 MG/50ML IVPB (CHCC)
16.0000 mg | Freq: Once | INTRAVENOUS | Status: AC
  Administered 2013-02-04: 16 mg via INTRAVENOUS

## 2013-02-04 MED ORDER — DIPHENHYDRAMINE HCL 50 MG/ML IJ SOLN
INTRAMUSCULAR | Status: AC
  Filled 2013-02-04: qty 1

## 2013-02-04 MED ORDER — ONDANSETRON 16 MG/50ML IVPB (CHCC)
INTRAVENOUS | Status: AC
  Filled 2013-02-04: qty 16

## 2013-02-04 MED ORDER — SODIUM CHLORIDE 0.9 % IV SOLN
169.8000 mg | Freq: Once | INTRAVENOUS | Status: AC
  Administered 2013-02-04: 170 mg via INTRAVENOUS
  Filled 2013-02-04: qty 17

## 2013-02-04 MED ORDER — DIPHENHYDRAMINE HCL 50 MG/ML IJ SOLN
50.0000 mg | Freq: Once | INTRAMUSCULAR | Status: AC
  Administered 2013-02-04: 50 mg via INTRAVENOUS

## 2013-02-04 MED ORDER — FAMOTIDINE IN NACL 20-0.9 MG/50ML-% IV SOLN
20.0000 mg | Freq: Once | INTRAVENOUS | Status: AC
  Administered 2013-02-04: 20 mg via INTRAVENOUS

## 2013-02-04 MED ORDER — DEXAMETHASONE SODIUM PHOSPHATE 20 MG/5ML IJ SOLN
20.0000 mg | Freq: Once | INTRAMUSCULAR | Status: AC
  Administered 2013-02-04: 20 mg via INTRAVENOUS

## 2013-02-04 MED ORDER — PACLITAXEL CHEMO INJECTION 300 MG/50ML
45.0000 mg/m2 | Freq: Once | INTRAVENOUS | Status: AC
  Administered 2013-02-04: 90 mg via INTRAVENOUS
  Filled 2013-02-04: qty 15

## 2013-02-04 NOTE — Progress Notes (Signed)
1330: Pt sent to radiation oncology with transporter, Sheralyn Boatman. Left anterior forearm IV site unremarkable. Dressing reinforced with Coban dressing. 1405: Pt returned. Normal saline infusing. IV site unremarkable.

## 2013-02-04 NOTE — Patient Instructions (Signed)
[facility] Discharge Instructions for Patients Receiving Chemotherapy  Today you received the following chemotherapy agents: Taxol an Carboplatin.  To help prevent nausea and vomiting after your treatment, we encourage you to take your nausea medication as needed.   If you develop nausea and vomiting that is not controlled by your nausea medication, call the clinic.   BELOW ARE SYMPTOMS THAT SHOULD BE REPORTED IMMEDIATELY:  *FEVER GREATER THAN 100.5 F  *CHILLS WITH OR WITHOUT FEVER  NAUSEA AND VOMITING THAT IS NOT CONTROLLED WITH YOUR NAUSEA MEDICATION  *UNUSUAL SHORTNESS OF BREATH  *UNUSUAL BRUISING OR BLEEDING  TENDERNESS IN MOUTH AND THROAT WITH OR WITHOUT PRESENCE OF ULCERS  *URINARY PROBLEMS  *BOWEL PROBLEMS  UNUSUAL RASH Items with * indicate a potential emergency and should be followed up as soon as possible.  Feel free to call the clinic should you have any questions or concerns. The clinic phone number is [phone].

## 2013-02-05 ENCOUNTER — Ambulatory Visit
Admission: RE | Admit: 2013-02-05 | Discharge: 2013-02-05 | Disposition: A | Discharge status: Home / Self Care | Payer: Medicare Other | Source: Ambulatory Visit | Attending: Radiation Oncology | Admitting: Radiation Oncology

## 2013-02-06 ENCOUNTER — Encounter: Payer: Self-pay | Admitting: Radiation Oncology

## 2013-02-06 ENCOUNTER — Ambulatory Visit
Admission: RE | Admit: 2013-02-06 | Discharge: 2013-02-06 | Disposition: A | Discharge status: Home / Self Care | Payer: Medicare Other | Source: Ambulatory Visit | Attending: Radiation Oncology | Admitting: Radiation Oncology

## 2013-02-07 ENCOUNTER — Ambulatory Visit
Admission: RE | Admit: 2013-02-07 | Discharge: 2013-02-07 | Disposition: A | Discharge status: Home / Self Care | Payer: Medicare Other | Source: Ambulatory Visit | Attending: Radiation Oncology | Admitting: Radiation Oncology

## 2013-02-08 ENCOUNTER — Ambulatory Visit: Payer: Medicare Other | Admitting: Radiation Oncology

## 2013-02-08 ENCOUNTER — Ambulatory Visit: Payer: Medicare Other

## 2013-02-11 ENCOUNTER — Ambulatory Visit
Admission: RE | Admit: 2013-02-11 | Discharge: 2013-02-11 | Disposition: A | Discharge status: Home / Self Care | Payer: Medicare Other | Source: Ambulatory Visit | Attending: Radiation Oncology | Admitting: Radiation Oncology

## 2013-02-11 ENCOUNTER — Ambulatory Visit (HOSPITAL_BASED_OUTPATIENT_CLINIC_OR_DEPARTMENT_OTHER): Payer: Medicare Other | Admitting: Physician Assistant

## 2013-02-11 ENCOUNTER — Other Ambulatory Visit (HOSPITAL_BASED_OUTPATIENT_CLINIC_OR_DEPARTMENT_OTHER): Payer: Medicare Other | Admitting: Lab

## 2013-02-11 ENCOUNTER — Telehealth: Payer: Self-pay | Admitting: Internal Medicine

## 2013-02-11 ENCOUNTER — Ambulatory Visit (HOSPITAL_BASED_OUTPATIENT_CLINIC_OR_DEPARTMENT_OTHER): Payer: Medicare Other

## 2013-02-11 ENCOUNTER — Other Ambulatory Visit: Payer: Self-pay | Admitting: Internal Medicine

## 2013-02-11 DIAGNOSIS — C3492 Malignant neoplasm of unspecified part of left bronchus or lung: Secondary | ICD-10-CM

## 2013-02-11 DIAGNOSIS — C3410 Malignant neoplasm of upper lobe, unspecified bronchus or lung: Secondary | ICD-10-CM

## 2013-02-11 DIAGNOSIS — Z5111 Encounter for antineoplastic chemotherapy: Secondary | ICD-10-CM

## 2013-02-11 LAB — CBC WITH DIFFERENTIAL/PLATELET
EOS%: 1.9 % (ref 0.0–7.0)
HCT: 31.1 % — ABNORMAL LOW (ref 38.4–49.9)
MCH: 30.5 pg (ref 27.2–33.4)
MCHC: 34.1 g/dL (ref 32.0–36.0)
MCV: 89.4 fL (ref 79.3–98.0)
MONO#: 0.5 10*3/uL (ref 0.1–0.9)
MONO%: 13.3 % (ref 0.0–14.0)
RBC: 3.48 10*6/uL — ABNORMAL LOW (ref 4.20–5.82)
RDW: 15.9 % — ABNORMAL HIGH (ref 11.0–14.6)
WBC: 3.6 10*3/uL — ABNORMAL LOW (ref 4.0–10.3)
lymph#: 0.6 10*3/uL — ABNORMAL LOW (ref 0.9–3.3)

## 2013-02-11 LAB — COMPREHENSIVE METABOLIC PANEL (CC13)
ALT: 35 U/L (ref 0–55)
AST: 36 U/L — ABNORMAL HIGH (ref 5–34)
Albumin: 3.9 g/dL (ref 3.5–5.0)
Alkaline Phosphatase: 119 U/L (ref 40–150)
BUN: 10.7 mg/dL (ref 7.0–26.0)
Calcium: 9.6 mg/dL (ref 8.4–10.4)
Chloride: 107 mEq/L (ref 98–109)
Potassium: 3.9 mEq/L (ref 3.5–5.1)
Sodium: 141 mEq/L (ref 136–145)
Total Protein: 7.5 g/dL (ref 6.4–8.3)

## 2013-02-11 MED ORDER — SODIUM CHLORIDE 0.9 % IV SOLN
170.0000 mg | Freq: Once | INTRAVENOUS | Status: AC
  Administered 2013-02-11: 170 mg via INTRAVENOUS
  Filled 2013-02-11: qty 17

## 2013-02-11 MED ORDER — ONDANSETRON 16 MG/50ML IVPB (CHCC)
INTRAVENOUS | Status: AC
  Filled 2013-02-11: qty 16

## 2013-02-11 MED ORDER — FAMOTIDINE IN NACL 20-0.9 MG/50ML-% IV SOLN
20.0000 mg | Freq: Once | INTRAVENOUS | Status: AC
  Administered 2013-02-11: 20 mg via INTRAVENOUS

## 2013-02-11 MED ORDER — FAMOTIDINE IN NACL 20-0.9 MG/50ML-% IV SOLN
INTRAVENOUS | Status: AC
  Filled 2013-02-11: qty 50

## 2013-02-11 MED ORDER — DIPHENHYDRAMINE HCL 50 MG/ML IJ SOLN
50.0000 mg | Freq: Once | INTRAMUSCULAR | Status: AC
  Administered 2013-02-11: 50 mg via INTRAVENOUS

## 2013-02-11 MED ORDER — SODIUM CHLORIDE 0.9 % IV SOLN
Freq: Once | INTRAVENOUS | Status: AC
  Administered 2013-02-11: 13:00:00 via INTRAVENOUS

## 2013-02-11 MED ORDER — DEXAMETHASONE SODIUM PHOSPHATE 20 MG/5ML IJ SOLN
20.0000 mg | Freq: Once | INTRAMUSCULAR | Status: AC
  Administered 2013-02-11: 20 mg via INTRAVENOUS

## 2013-02-11 MED ORDER — ONDANSETRON 16 MG/50ML IVPB (CHCC)
16.0000 mg | Freq: Once | INTRAVENOUS | Status: AC
  Administered 2013-02-11: 16 mg via INTRAVENOUS

## 2013-02-11 MED ORDER — PACLITAXEL CHEMO INJECTION 300 MG/50ML
45.0000 mg/m2 | Freq: Once | INTRAVENOUS | Status: AC
  Administered 2013-02-11: 90 mg via INTRAVENOUS
  Filled 2013-02-11: qty 15

## 2013-02-11 MED ORDER — DIPHENHYDRAMINE HCL 50 MG/ML IJ SOLN
INTRAMUSCULAR | Status: AC
  Filled 2013-02-11: qty 1

## 2013-02-11 MED ORDER — DEXAMETHASONE SODIUM PHOSPHATE 20 MG/5ML IJ SOLN
INTRAMUSCULAR | Status: AC
  Filled 2013-02-11: qty 5

## 2013-02-11 NOTE — Telephone Encounter (Signed)
gave pt appt for lab and MD for December 2014

## 2013-02-11 NOTE — Progress Notes (Signed)
Weekly rad txs completed 30/33 chest, erythema on front and back skin inntact, patient had chemotherapy today just completd, coughs up clear pheglm, energy level low, stayed [place] on Friday[place] no c/o nausea, occaional pain goes through left chest side,   3:56 PM

## 2013-02-11 NOTE — Patient Instructions (Signed)
[facility] Discharge Instructions for Patients Receiving Chemotherapy  Today you received the following chemotherapy agents Taxol and Carboplatin. To help prevent nausea and vomiting after your treatment, we encourage you to take your nausea medication Compazine 10 mg as directed   If you develop nausea and vomiting that is not controlled by your nausea medication, call the clinic.   BELOW ARE SYMPTOMS THAT SHOULD BE REPORTED IMMEDIATELY:  *FEVER GREATER THAN 100.5 F  *CHILLS WITH OR WITHOUT FEVER  NAUSEA AND VOMITING THAT IS NOT CONTROLLED WITH YOUR NAUSEA MEDICATION  *UNUSUAL SHORTNESS OF BREATH  *UNUSUAL BRUISING OR BLEEDING  TENDERNESS IN MOUTH AND THROAT WITH OR WITHOUT PRESENCE OF ULCERS  *URINARY PROBLEMS  *BOWEL PROBLEMS  UNUSUAL RASH Items with * indicate a potential emergency and should be followed up as soon as possible.  Feel free to call the clinic you have any questions or concerns. The clinic phone number is [phone].

## 2013-02-12 ENCOUNTER — Ambulatory Visit
Admission: RE | Admit: 2013-02-12 | Discharge: 2013-02-12 | Disposition: A | Discharge status: Home / Self Care | Payer: Medicare Other | Source: Ambulatory Visit | Attending: Radiation Oncology | Admitting: Radiation Oncology

## 2013-02-12 NOTE — Progress Notes (Addendum)
No images are attached to the encounter. No scans are attached to the encounter. No scans are attached to the encounter. [facility] SHARED VISIT PROGRESS NOTE  [name], MD [address] [place] [address]  DIAGNOSIS: Stage IIIa (T1 B., N2, M0) non-small cell lung cancer, adenocarcinoma with negative EGFR mutation and negative ALK gene translocation  PRIOR THERAPY: None  CURRENT THERAPY: Concurrent chemoradiation with weekly carboplatin for an AUC of 2 and paclitaxel at 45 mg per meter squared concurrent with radiation therapy under the care of Dr. Mitzi Hansen. He status post 6 cycles of chemotherapy  DISEASE STAGE: Stage IIIa (T1 B., N2, M0)  CHEMOTHERAPY INTENT: Control/curative  CURRENT # OF CHEMOTHERAPY CYCLES: 7  CURRENT ANTIEMETICS: Zofran, dexamethasone, and Compazine for at home use  CURRENT SMOKING STATUS: Former smoker quit 12/20/1992  ORAL CHEMOTHERAPY AND CONSENT: n/a  CURRENT BISPHOSPHONATES USE: None  PAIN MANAGEMENT: Dilaudid  NARCOTICS INDUCED CONSTIPATION: None  LIVING WILL AND CODE STATUS: ?   INTERVAL HISTORY: Christopher Roach 79 y.o. male returns for a scheduled regular office visit for followup of his recently diagnosed stage IIIa non-small cell lung cancer, adenocarcinoma. He is accompanied by his friend Lupita Leash. He reports that he just returned from a weekend at [employer]. He is tolerating his course of concurrent chemoradiation without difficulty. He continues to eat and drink well. His daughter sees that he eats a well-balanced diet. He has some issues with constipation and diarrhea however they spontaneously resolve. He denied any problems with nausea or vomiting. He will complete his radiation therapy 02/14/2013. His shortness of breath is at baseline. He voiced no other specific complaints today.   MEDICAL HISTORY: Past Medical History  Diagnosis Date  . Hypertension   . Hyperlipidemia   . Hypothyroidism   .  Abnormal LFTs   . Gallbladder polyp   . BPH (benign prostatic hyperplasia)   . Actinic keratosis   . Lung nodule     benign BX 03/24/2008  . Anemia   . H/O asbestos exposure   . Pneumonia     hx of walking PNA  . Shortness of breath     with exertion  . Constipation     from medications  . Arthritis   . Kidney stone August 2014    hx of  . Birth mark     left arm and shoulder; Dark purple on upper L chest ;NOT A RASH    ALLERGIES:  is allergic to codeine.  MEDICATIONS:  Current Outpatient Prescriptions  Medication Sig Dispense Refill  . Alum & Mag Hydroxide-Simeth (MAGIC MOUTHWASH W/LIDOCAINE) SOLN 1part nystatin,1part Maaloxplus,1part benadryl,3part 2%viscous lidocaine. Swallow 10 mL up to QID, before meals/bedtime  480 mL  1  . amLODipine (NORVASC) 10 MG tablet Take 10 mg by mouth at bedtime.       Marland Kitchen aspirin EC 81 MG tablet Take 81 mg by mouth daily.      Marland Kitchen emollient (BIAFINE) cream Apply 1 application topically daily.      Marland Kitchen levothyroxine (SYNTHROID, LEVOTHROID) 125 MCG tablet Take 125 mcg by mouth at bedtime.       . prochlorperazine (COMPAZINE) 10 MG tablet Take 1 tablet (10 mg total) by mouth every 6 (six) hours as needed.  60 tablet  0  . rosuvastatin (CRESTOR) 10 MG tablet Take 10 mg by mouth daily.      . tamsulosin (FLOMAX) 0.4 MG CAPS Take 0.4 mg by mouth at bedtime.       Marland Kitchen  zolpidem (AMBIEN) 10 MG tablet Take 10 mg by mouth at bedtime.       Marland Kitchen HYDROmorphone (DILAUDID) 2 MG tablet Take 1 tablet (2 mg total) by mouth every 4 (four) hours as needed for pain.  40 tablet  0   No current facility-administered medications for this visit.    SURGICAL HISTORY:  Past Surgical History  Procedure Laterality Date  . Back surgery      Dr Fannie Knee 1978 and 1982  . Cervical disc surgery    . Right foot surgery      plantar fascitis  . Right shoulder surgery      torn rotator cuff  . Right ankle fracture         . Right wrist fracture    . Foot surgery Left   .  Tonsillectomy    . Appendectomy    . Colonoscopy    . Flexible bronchoscopy N/A 12/07/2012    Procedure: FLEXIBLE BRONCHOSCOPY;  Surgeon: [name], MD;  Location: [facility] OR;  Service: Thoracic;  Laterality: N/A;  . Video assisted thoracoscopy (vats)/thorocotomy Left 12/07/2012    Procedure: VIDEO ASSISTED THORACOSCOPY (VATS)/ EXPLORATORY THOROCOTOMY;  Surgeon: [name], MD;  Location: [facility] OR;  Service: Thoracic;  Laterality: Left;    REVIEW OF SYSTEMS:  A comprehensive review of systems was negative except for: Constitutional: positive for Insomnia   PHYSICAL EXAMINATION: General appearance: alert, cooperative, appears stated age and no distress Head: Normocephalic, without obvious abnormality, atraumatic Neck: no adenopathy, no carotid bruit, no JVD, supple, symmetrical, trachea midline and thyroid not enlarged, symmetric, no tenderness/mass/nodules Lymph nodes: Cervical, supraclavicular, and axillary nodes normal. Resp: clear to auscultation bilaterally Cardio: regular rate and rhythm, S1, S2 normal, no murmur, click, rub or gallop GI: soft, non-tender; bowel sounds normal; no masses,  no organomegaly Extremities: edema Trace pitting edema bilateral lower extremities, left greater than the right Neurologic: Alert and oriented X 3, normal strength and tone. Normal symmetric reflexes. Normal coordination and gait  ECOG PERFORMANCE STATUS: 1 - Symptomatic but completely ambulatory  Blood pressure 143/71, pulse 72, temperature 97.7 F (36.5 C), temperature source Oral, resp. rate 18, height 5\' 8"  (1.727 m), weight 188 lb 9.6 oz (85.548 kg).  LABORATORY DATA: Lab Results  Component Value Date   WBC 3.6* 02/11/2013   HGB 10.6* 02/11/2013   HCT 31.1* 02/11/2013   MCV 89.4 02/11/2013   PLT 104* 02/11/2013      Chemistry      Component Value Date/Time   NA 141 02/11/2013 1137   NA 132* 12/11/2012 0505   K 3.9 02/11/2013 1137   K 4.9 12/11/2012 0505   CL 97 12/11/2012 0505   CO2 22  02/11/2013 1137   CO2 28 12/11/2012 0505   BUN 10.7 02/11/2013 1137   BUN 22 12/11/2012 0505   CREATININE 1.0 02/11/2013 1137   CREATININE 1.25 12/11/2012 0505      Component Value Date/Time   CALCIUM 9.6 02/11/2013 1137   CALCIUM 8.7 12/11/2012 0505   ALKPHOS 119 02/11/2013 1137   ALKPHOS 129* 12/05/2012 1348   AST 36* 02/11/2013 1137   AST 44* 12/05/2012 1348   ALT 35 02/11/2013 1137   ALT 35 12/05/2012 1348   BILITOT 0.49 02/11/2013 1137   BILITOT 0.5 12/05/2012 1348       RADIOGRAPHIC STUDIES:  Dg Chest 2 View  12/19/2012   *RADIOLOGY REPORT*  Clinical Data: Postoperative evaluation after resection of lung mass.  CHEST - 2 VIEW  Comparison: 12/11/2012 and 12/10/2012.  Findings: Stable volume loss in the left hemithorax.  No evidence for pneumothorax.  Left-sided pleural thickening towards the base is stable.  Subcutaneous emphysema and the basilar pneumothorax seen previously have resolved in the interval.  Right lung is hyperexpanded but clear. The cardiopericardial silhouette is enlarged.  Bones are diffusely demineralized.  IMPRESSION: Interval resolution of tiny left pneumothorax and subcutaneous emphysema.  Stable appearance of pleural thickening in the lower left hemithorax.   Original Report Authenticated By: Kennith Center, M.D.   Dg Chest 2 View  12/11/2012   *RADIOLOGY REPORT*  Clinical Data: Post left sided vats, evaluate for pneumothorax  CHEST - 2 VIEW  Comparison: 12/10/2012; 12/09/2012; 12/08/2012  Findings:  Grossly unchanged cardiac silhouette and mediastinal contours with mild tortuosity of the thoracic aorta.  There is persistent mild rightward tracheal deviation at the level of the aortic arch.  The lungs remain hyperexpanded.  Interval removal left sided chest tube with development of a tiny loculated left basilar hydropneumothorax.  No change to slight increase in amount of left lateral chest wall subcutaneous emphysema.  Left basilar heterogeneous opacity are grossly unchanged.  No  new focal airspace opacity. No definite evidence of edema.  No definite pleural effusion.  Unchanged bones.  A surgical clip overlies the left lower neck.  IMPRESSION: Interval removal of left-sided chest tube with development of a tiny left basilar hydropneumothorax.   Original Report Authenticated By: Tacey Ruiz, MD   Dg Chest 2 View  12/05/2012   *RADIOLOGY REPORT*  Clinical Data: Preop for lung mass.  CHEST - 2 VIEW  Comparison: Plain films 09/25/2012.  P E T 11/06/2012  Findings: There is tracheal deviation to the left, likely due to volume loss in the left hemithorax.  Mild cardiomegaly with a tortuous descending thoracic aorta.  Small left-sided pleural effusion versus thickening, chronic. No pneumothorax.  Moderate lower lobe predominant interstitial thickening.  Scarring at the left lung base.  The primary lesion, which abuts the left side of the mediastinum at the level of the AP window, is not readily apparent.  IMPRESSION: Interstitial thickening and left-sided pleural parenchymal scarring.  The primary lesion, in the medial left upper lobe, is not readily plain film apparent.   Original Report Authenticated By: Jeronimo Greaves, M.D.   Dg Chest Port 1 View  12/10/2012   *RADIOLOGY REPORT*  Clinical Data: Lung cancer status post left thoracotomy.  PORTABLE CHEST - 1 VIEW  Comparison: Chest x-ray 12/09/2012.  Findings: Left-sided chest tube remains in position with tip in the apex of the left hemithorax.  No appreciable left pneumothorax is identified at this time.  Extensive architectural distortion throughout the left lung likely represents some resolving postoperative edema/hemorrhage.  Right lung appears relatively clear.  Bibasilar opacities favored to reflect subsegmental atelectasis.  No evidence of pulmonary edema.  Heart size is borderline enlarged.  Mediastinal contours are within normal limits.  Atherosclerosis in the thoracic aorta.  IMPRESSION: 1.  Allowing for slight differences in patient  positioning, the radiographic appearance of the chest is essentially unchanged, as above.   Original Report Authenticated By: Trudie Reed, M.D.   Dg Chest Port 1 View  12/09/2012   *RADIOLOGY REPORT*  Clinical Data: Status post left thoracotomy  PORTABLE CHEST - 1 VIEW  Comparison: 12/08/2012  Findings: The heart and pulmonary vascularity are within normal limits.  The left-sided chest tube is again identified and stable. No pneumothorax is seen.  The right lung remains clear.  Patchy changes are identified in the left lung.  IMPRESSION: No pneumothorax.  Patchy changes in the left lung stable from prior exam.   Original Report Authenticated By: Alcide Clever, M.D.   Dg Chest Port 1 View  12/08/2012   *RADIOLOGY REPORT*  Clinical Data: Left chest tube.  PORTABLE CHEST - 1 VIEW  Comparison: Chest x-ray from yesterday.  Findings: Left-sided chest tube, extending over the apex.  There is been a modest increase in left chest subcutaneous emphysema.  No evident pneumothorax.  Unchanged atelectasis and pleural thickening at the left base.  Mild right basilar atelectasis.  Stable positioning of right IJ approach central venous catheter.  Unchanged upper mediastinal contours. Chronic cardiomegaly.  IMPRESSION:  1.  No definitive pneumothorax, although left chest subcutaneous emphysema has slightly increased.  Left chest tube in stable position. 2.  Left more than right base atelectasis, similar yesterday.   Original Report Authenticated By: Tiburcio Pea   Dg Chest Portable 1 View  12/07/2012   *RADIOLOGY REPORT*  Clinical Data: Status post surgery with central line placement and chest tube placement  PORTABLE CHEST - 1 VIEW  Comparison: 12/05/2012  Findings: A right-sided jugular line is noted in the mid superior vena cava.  A left the left-sided chest tube is seen new from prior exam.  Some atelectasis is noted in the left base.  No pneumothorax is noted bilaterally.  Cardiac shadow is stable.  IMPRESSION:  Postsurgical changes are noted.  No acute abnormality is seen with exception of atelectasis in the left lung base.  These results were called by telephone on 12/07/2012 at in 23 hours to [facility] the patient's nurse, who verbally acknowledged these results.   Original Report Authenticated By: Alcide Clever, M.D.   Dg Abd 2 Views  12/11/2012   *RADIOLOGY REPORT*  Clinical Data: Abdominal distension, evaluate for ileus  ABDOMEN - 2 VIEW  Comparison: PET CT - 11/06/2012; chest radiograph of earlier same day  Findings:  There is mild gaseous distension of several loops of small bowel within this loop within the left mid abdomen measuring approximately 3.7 cm in diameter.  This finding is associated with a paucity of bowel gas with small amount of air is seen within the hepatic flexure of the colon.  No definite pneumoperitoneum, pneumatosis or portal venous gas.  Limited visualization of the lower thorax demonstrates a residual tiny left basilar hydropneumothorax post recent left-sided chest tube removal.  There is a persistent small likely partially locular left-sided effusion.  There is a minimal amount of residual subcutaneous emphysema about the inferior left lateral chest wall.  Multilevel thoracolumbar are spine degenerative change.  IMPRESSION: 1. Nonspecific mild gaseous distension of several loops of small bowel with overall paucity of distal colonic gas.  While possibly representative of ileus, developing partial small bowel obstruction is not excluded.  Continued attention on follow up may be performed as clinically indicated. 2.  Limited visualization of the lower thorax demonstrates a persistent trace left-sided hydropneumothorax post recent chest tube removal.   Original Report Authenticated By: Tacey Ruiz, MD     ASSESSMENT/PLAN: Is a very pleasant 79 year-old white male recently diagnosed with stage IIIa (T1 B., N2, M0) non-small cell lung cancer adenocarcinoma with negative EGFR mutation and negative  ALK gene translocation. He has an unresectable tumor. He is currently undergoing a course of concurrent chemoradiation.  Patient was discussed with and also seen by Dr. Arbutus Ped. He will complete his course of concurrent chemoradiation as scheduled. He  will follow with Dr. Arbutus Ped in one month with a restaging CT scan of the chest without contrast reevaluate his disease as well as [place] repeat CBC differential and C. met.     Laural Benes, Roscoe Witts E, PA-C     All questions were answered. The patient knows to call the clinic with any problems, questions or concerns. We can certainly see the patient much sooner if necessary.  ADDENDUM: Hematology/Oncology Attending: I had the face to face encounter with the patient. I recommended his care plan. This is a very pleasant 79 years old white male with unresectable stage IIIa non-small cell lung cancer currently undergoing concurrent chemoradiation with weekly carboplatin and paclitaxel. He is rating his treatment fairly well with no significant adverse effects. We finish this course of concurrent chemoradiation as scheduled. The patient would come back for followup visit in one month's for reevaluation with repeat CT scan of the chest for restaging of his disease. He was advised to call immediately if he has any concerning symptoms in the interval. Lajuana Matte., MD 02/17/2013

## 2013-02-12 NOTE — Patient Instructions (Signed)
Complete her course of concurrent chemoradiation as scheduled Followup with Dr. Arbutus Ped in one month with a restaging CT scan of your chest to reevaluate your disease

## 2013-02-12 NOTE — Progress Notes (Signed)
   Department of Radiation Oncology  Phone:  [phone] Fax:        [phone]  Weekly Treatment Note    Name: Christopher Roach Date: 02/12/2013 MRN: [id number] DOB: 12/22/1930   Current dose: 60 Gy  Current fraction: 30   MEDICATIONS: Current Outpatient Prescriptions  Medication Sig Dispense Refill  . Alum & Mag Hydroxide-Simeth (MAGIC MOUTHWASH W/LIDOCAINE) SOLN 1part nystatin,1part Maaloxplus,1part benadryl,3part 2%viscous lidocaine. Swallow 10 mL up to QID, before meals/bedtime  480 mL  1  . amLODipine (NORVASC) 10 MG tablet Take 10 mg by mouth at bedtime.       Marland Kitchen emollient (BIAFINE) cream Apply 1 application topically daily.      Marland Kitchen HYDROmorphone (DILAUDID) 2 MG tablet Take 1 tablet (2 mg total) by mouth every 4 (four) hours as needed for pain.  40 tablet  0  . levothyroxine (SYNTHROID, LEVOTHROID) 125 MCG tablet Take 125 mcg by mouth at bedtime.       . prochlorperazine (COMPAZINE) 10 MG tablet Take 1 tablet (10 mg total) by mouth every 6 (six) hours as needed.  60 tablet  0  . rosuvastatin (CRESTOR) 10 MG tablet Take 10 mg by mouth daily.      . tamsulosin (FLOMAX) 0.4 MG CAPS Take 0.4 mg by mouth at bedtime.       Marland Kitchen zolpidem (AMBIEN) 10 MG tablet Take 10 mg by mouth at bedtime.       Marland Kitchen aspirin EC 81 MG tablet Take 81 mg by mouth daily.       No current facility-administered medications for this encounter.     ALLERGIES: Codeine   LABORATORY DATA:  Lab Results  Component Value Date   WBC 3.6* 02/11/2013   HGB 10.6* 02/11/2013   HCT 31.1* 02/11/2013   MCV 89.4 02/11/2013   PLT 104* 02/11/2013   Lab Results  Component Value Date   NA 141 02/11/2013   K 3.9 02/11/2013   CL 97 12/11/2012   CO2 22 02/11/2013   Lab Results  Component Value Date   ALT 35 02/11/2013   AST 36* 02/11/2013   ALKPHOS 119 02/11/2013   BILITOT 0.49 02/11/2013     NARRATIVE: Christopher Roach was seen today for weekly treatment management. The chart was checked and the patient's films  were reviewed. The patient is doing quite well at this time. The patient was out of town this week. He has had a good energy level and has been able to continue to eat quite well.  PHYSICAL EXAMINATION:  Alert, in no acute distress       ASSESSMENT: The patient is doing satisfactorily with treatment.  PLAN: We will continue with the patient's radiation treatment as planned.

## 2013-02-13 ENCOUNTER — Encounter: Payer: Self-pay | Admitting: Radiation Oncology

## 2013-02-13 ENCOUNTER — Ambulatory Visit
Admission: RE | Admit: 2013-02-13 | Discharge: 2013-02-13 | Disposition: A | Discharge status: Home / Self Care | Payer: Medicare Other | Source: Ambulatory Visit | Attending: Radiation Oncology | Admitting: Radiation Oncology

## 2013-02-13 NOTE — Progress Notes (Signed)
   Department of Radiation Oncology  Phone:  [phone] Fax:        [phone]  Weekly Treatment Note    Name: Christopher Roach Date: 02/13/2013 MRN: [id number] DOB: 28-Oct-1930   Current dose: 64 Gy  Current fraction: 32   MEDICATIONS: Current Outpatient Prescriptions  Medication Sig Dispense Refill  . Alum & Mag Hydroxide-Simeth (MAGIC MOUTHWASH W/LIDOCAINE) SOLN 1part nystatin,1part Maaloxplus,1part benadryl,3part 2%viscous lidocaine. Swallow 10 mL up to QID, before meals/bedtime  480 mL  1  . amLODipine (NORVASC) 10 MG tablet Take 10 mg by mouth at bedtime.       Marland Kitchen aspirin EC 81 MG tablet Take 81 mg by mouth daily.      Marland Kitchen HYDROmorphone (DILAUDID) 2 MG tablet Take 1 tablet (2 mg total) by mouth every 4 (four) hours as needed for pain.  40 tablet  0  . levothyroxine (SYNTHROID, LEVOTHROID) 125 MCG tablet Take 125 mcg by mouth at bedtime.       . prochlorperazine (COMPAZINE) 10 MG tablet Take 1 tablet (10 mg total) by mouth every 6 (six) hours as needed.  60 tablet  0  . rosuvastatin (CRESTOR) 10 MG tablet Take 10 mg by mouth daily.      . tamsulosin (FLOMAX) 0.4 MG CAPS Take 0.4 mg by mouth at bedtime.       Marland Kitchen zolpidem (AMBIEN) 10 MG tablet Take 10 mg by mouth at bedtime.       Marland Kitchen emollient (BIAFINE) cream Apply 1 application topically daily.       No current facility-administered medications for this encounter.     ALLERGIES: Codeine   LABORATORY DATA:  Lab Results  Component Value Date   WBC 3.6* 02/11/2013   HGB 10.6* 02/11/2013   HCT 31.1* 02/11/2013   MCV 89.4 02/11/2013   PLT 104* 02/11/2013   Lab Results  Component Value Date   NA 141 02/11/2013   K 3.9 02/11/2013   CL 97 12/11/2012   CO2 22 02/11/2013   Lab Results  Component Value Date   ALT 35 02/11/2013   AST 36* 02/11/2013   ALKPHOS 119 02/11/2013   BILITOT 0.49 02/11/2013     NARRATIVE: Christopher Roach was seen today for weekly treatment management. The chart was checked and the patient's films  were reviewed. The patient is doing well. The patient has not had any major recent changes in terms of difficulty. He is having some esophagitis which is well controlled with Carafate medication.  PHYSICAL EXAMINATION: weight is 187 lb 9.6 oz (85.095 kg). His oral temperature is 98.1 F (36.7 C). His blood pressure is 123/65 and his pulse is 60. His respiration is 20.        ASSESSMENT: The patient is doing satisfactorily with treatment.  PLAN: We will continue with the patient's radiation treatment as planned.

## 2013-02-13 NOTE — Progress Notes (Signed)
Weekly rad txs 32/33 lung completed 1 month appt card given, does take carafte not as directed though, some difficulty swallowing solid foods but "I manage ", pretty good energy stated,  Exercised walking the dog daily, drinkig enough fluids 2:47 PM

## 2013-02-14 ENCOUNTER — Other Ambulatory Visit: Payer: Self-pay

## 2013-02-14 ENCOUNTER — Ambulatory Visit
Admission: RE | Admit: 2013-02-14 | Discharge: 2013-02-14 | Disposition: A | Discharge status: Home / Self Care | Payer: Medicare Other | Source: Ambulatory Visit | Attending: Radiation Oncology | Admitting: Radiation Oncology

## 2013-02-14 ENCOUNTER — Encounter: Payer: Self-pay | Admitting: Radiation Oncology

## 2013-02-17 ENCOUNTER — Encounter: Payer: Self-pay | Admitting: Physician Assistant

## 2013-02-28 NOTE — Progress Notes (Signed)
  Radiation Oncology         [phone] ________________________________  Name: Christopher Roach MRN: [id number]  Date: 02/14/2013  DOB: 15-Jan-1931  End of Treatment Note  Diagnosis:   Non-small cell lung cancer     Indication for treatment:  Curative       Radiation treatment dates:   12/31/2012 through 02/14/2013  Site/dose:   The patient was treated to the gross disease within the left chest using a 3-D conformal technique, 4 field. This delivered 60 gray at 2 gray per fraction. He then received a coned-down 4 field boost treatment for an additional 6 gray to yield a total of 66 gray.  Narrative: The patient tolerated radiation treatment relatively well.   The patient experienced some esophagitis during treatment which was managed medically. Overall he did quite well.  Plan: The patient has completed radiation treatment. The patient will return to radiation oncology clinic for routine followup in one month. I advised the patient to call or return sooner if they have any questions or concerns related to their recovery or treatment. ________________________________  Radene Gunning, M.D., Ph.D.

## 2013-02-28 NOTE — Progress Notes (Signed)
  Radiation Oncology         [phone] ________________________________  Name: Christopher Roach MRN: [id number]  Date: 02/06/2013  DOB: Oct 04, 1930  COMPLEX SIMULATION  NOTE  Diagnosis: lung cancer  Narrative The patient has undergone a complex simulation for the patient's upcoming boost treatment.   Radiation dose prior to boost: 60 Gy  Boost dose to the high risk target:  6 Gy, to be delivered in 3 fractions  To accomplish the boost treatment, an additional 4 customized blocks have been designed for this purpose, and each of these complex treatment devices will be used on a daily basis. A complex isodose plan is requested to ensure that the high-risk target region receives the appropriate radiation dose and that the nearby normal structures continue to be appropriately spared. Dose volume histograms for the cumulative plan will be reviewed for the relevant structures.   Total dose after boost:  66 Gy    ________________________________   Radene Gunning, MD, PhD

## 2013-03-14 ENCOUNTER — Ambulatory Visit
Admission: RE | Admit: 2013-03-14 | Discharge: 2013-03-14 | Disposition: A | Discharge status: Home / Self Care | Payer: Medicare Other | Source: Ambulatory Visit | Attending: Radiation Oncology | Admitting: Radiation Oncology

## 2013-03-14 ENCOUNTER — Encounter: Payer: Self-pay | Admitting: Radiation Oncology

## 2013-03-14 NOTE — Progress Notes (Signed)
Follow up s/p rad txs lung 12/31/12-02/14/13, jsut completed levofloxacin this past Saturday 10 days worth, had pleurisy,UTI, and has sob with exertion , non prod cough, labs done 11/7/14thyroid=5.380 wil recheck at MD office in January 2015 has CT chest scheduled 03/18/13, and Dr.Mohamed 03/20/13, still has a little pain left side chest rib area 1:56 PM

## 2013-03-15 NOTE — Progress Notes (Signed)
  Radiation Oncology         [phone] ________________________________  Name: Christopher Roach MRN: [id number]  Date: 03/14/2013  DOB: 1930/08/21  Follow-Up Visit Note  CC: [name], MD  [name], MD  Diagnosis:   Non-small cell lung cancer  Interval Since Last Radiation:  One month   Narrative:  The patient returns today for routine follow-up.  The patient completed his radiation treatment on 02/14/2013. He states that she he has had some issues since that time. He had a urinary tract infection. The patient also states that he was diagnosed with pleurisy. He is feeling better. The patient finished a course of Levaquin this past Saturday. He still has some pain on the left side of the chest. No complaints of esophagitis today/difficulty swallowing.                              ALLERGIES:  is allergic to codeine.  Meds: Current Outpatient Prescriptions  Medication Sig Dispense Refill  . amLODipine (NORVASC) 10 MG tablet Take 10 mg by mouth at bedtime.       Marland Kitchen aspirin EC 81 MG tablet Take 81 mg by mouth daily.      Marland Kitchen levothyroxine (SYNTHROID, LEVOTHROID) 125 MCG tablet Take 125 mcg by mouth at bedtime.       . rosuvastatin (CRESTOR) 10 MG tablet Take 10 mg by mouth daily.      . tamsulosin (FLOMAX) 0.4 MG CAPS Take 0.4 mg by mouth at bedtime.       Marland Kitchen zolpidem (AMBIEN) 10 MG tablet Take 10 mg by mouth at bedtime.       . Alum & Mag Hydroxide-Simeth (MAGIC MOUTHWASH W/LIDOCAINE) SOLN 1part nystatin,1part Maaloxplus,1part benadryl,3part 2%viscous lidocaine. Swallow 10 mL up to QID, before meals/bedtime  480 mL  1  . emollient (BIAFINE) cream Apply 1 application topically daily.      . prochlorperazine (COMPAZINE) 10 MG tablet Take 1 tablet (10 mg total) by mouth every 6 (six) hours as needed.  60 tablet  0   No current facility-administered medications for this encounter.    Physical Findings: The patient is in no acute distress. Patient is alert and oriented.  weight  is 185 lb 14.4 oz (84.324 kg). His oral temperature is 97.5 F (36.4 C). His blood pressure is 139/71 and his pulse is 78. His respiration is 20 and oxygen saturation is 97%. .   General: Well-developed, in no acute distress HEENT: Normocephalic, atraumatic Cardiovascular: Regular rate and rhythm Respiratory: Clear to auscultation bilaterally GI: Soft, nontender, normal bowel sounds Extremities: No edema present   Lab Findings: Lab Results  Component Value Date   WBC 3.6* 02/11/2013   HGB 10.6* 02/11/2013   HCT 31.1* 02/11/2013   MCV 89.4 02/11/2013   PLT 104* 02/11/2013     Radiographic Findings: No results found.  Impression:    The patient appears to have recovered some from his radiation treatment. The patient's skin has healed adequately and he does not have complaints of esophagitis today. He is scheduled for a repeat CT scan of the chest next week with followup with Dr. Shirline Frees in medical oncology.  Plan:  Six-month followup.   Radene Gunning, M.D., Ph.D.

## 2013-03-18 ENCOUNTER — Ambulatory Visit (HOSPITAL_COMMUNITY)
Admission: RE | Admit: 2013-03-18 | Discharge: 2013-03-18 | Disposition: A | Discharge status: Home / Self Care | Payer: Medicare Other | Source: Ambulatory Visit | Attending: Physician Assistant | Admitting: Physician Assistant

## 2013-03-18 ENCOUNTER — Other Ambulatory Visit (HOSPITAL_BASED_OUTPATIENT_CLINIC_OR_DEPARTMENT_OTHER): Payer: Medicare Other | Admitting: Lab

## 2013-03-18 DIAGNOSIS — E278 Other specified disorders of adrenal gland: Secondary | ICD-10-CM | POA: Insufficient documentation

## 2013-03-18 DIAGNOSIS — C3410 Malignant neoplasm of upper lobe, unspecified bronchus or lung: Secondary | ICD-10-CM

## 2013-03-18 DIAGNOSIS — I2510 Atherosclerotic heart disease of native coronary artery without angina pectoris: Secondary | ICD-10-CM | POA: Insufficient documentation

## 2013-03-18 DIAGNOSIS — R0602 Shortness of breath: Secondary | ICD-10-CM | POA: Insufficient documentation

## 2013-03-18 DIAGNOSIS — R0789 Other chest pain: Secondary | ICD-10-CM | POA: Insufficient documentation

## 2013-03-18 DIAGNOSIS — Z9221 Personal history of antineoplastic chemotherapy: Secondary | ICD-10-CM | POA: Insufficient documentation

## 2013-03-18 DIAGNOSIS — Z923 Personal history of irradiation: Secondary | ICD-10-CM | POA: Insufficient documentation

## 2013-03-18 DIAGNOSIS — J438 Other emphysema: Secondary | ICD-10-CM | POA: Insufficient documentation

## 2013-03-18 DIAGNOSIS — C3492 Malignant neoplasm of unspecified part of left bronchus or lung: Secondary | ICD-10-CM

## 2013-03-18 LAB — CBC WITH DIFFERENTIAL/PLATELET
Eosinophils Absolute: 0.1 10*3/uL (ref 0.0–0.5)
HCT: 33.9 % — ABNORMAL LOW (ref 38.4–49.9)
LYMPH%: 16.7 % (ref 14.0–49.0)
MCH: 31.9 pg (ref 27.2–33.4)
MCV: 94.6 fL (ref 79.3–98.0)
MONO#: 0.4 10*3/uL (ref 0.1–0.9)
MONO%: 8.5 % (ref 0.0–14.0)
NEUT#: 3.4 10*3/uL (ref 1.5–6.5)
NEUT%: 71.5 % (ref 39.0–75.0)
Platelets: 82 10*3/uL — ABNORMAL LOW (ref 140–400)
RBC: 3.58 10*6/uL — ABNORMAL LOW (ref 4.20–5.82)
RDW: 16.7 % — ABNORMAL HIGH (ref 11.0–14.6)
WBC: 4.8 10*3/uL (ref 4.0–10.3)

## 2013-03-18 LAB — COMPREHENSIVE METABOLIC PANEL (CC13)
Anion Gap: 8 mEq/L (ref 3–11)
BUN: 14.5 mg/dL (ref 7.0–26.0)
CO2: 26 mEq/L (ref 22–29)
Calcium: 9.1 mg/dL (ref 8.4–10.4)
Creatinine: 1 mg/dL (ref 0.7–1.3)
Glucose: 170 mg/dl — ABNORMAL HIGH (ref 70–140)
Potassium: 3.9 mEq/L (ref 3.5–5.1)
Total Bilirubin: 0.39 mg/dL (ref 0.20–1.20)

## 2013-03-20 ENCOUNTER — Encounter: Payer: Self-pay | Admitting: Internal Medicine

## 2013-03-20 ENCOUNTER — Ambulatory Visit (HOSPITAL_BASED_OUTPATIENT_CLINIC_OR_DEPARTMENT_OTHER): Payer: Medicare Other | Admitting: Internal Medicine

## 2013-03-20 ENCOUNTER — Telehealth: Payer: Self-pay | Admitting: Internal Medicine

## 2013-03-20 DIAGNOSIS — C3410 Malignant neoplasm of upper lobe, unspecified bronchus or lung: Secondary | ICD-10-CM

## 2013-03-20 NOTE — Telephone Encounter (Signed)
GAve pt appt for lab and Md for March 2015

## 2013-03-20 NOTE — Patient Instructions (Signed)
CURRENT THERAPY: None.  DISEASE STAGE: Stage IIIA (T1b, N2, M0)  CHEMOTHERAPY INTENT: Control/curative  CURRENT # OF CHEMOTHERAPY CYCLES: 7  CURRENT ANTIEMETICS: Zofran, dexamethasone, and Compazine for at home use  CURRENT SMOKING STATUS: Former smoker quit 12/20/1992  ORAL CHEMOTHERAPY AND CONSENT: n/a  CURRENT BISPHOSPHONATES USE: None  PAIN MANAGEMENT: Dilaudid  NARCOTICS INDUCED CONSTIPATION: None  LIVING WILL AND CODE STATUS: ?

## 2013-03-20 NOTE — Progress Notes (Signed)
[facility]  [phone] [phone]  OFFICE VISIT PROGRESS NOTE  [name], MD [address] [place] [address]  DIAGNOSIS: Stage IIIA (T1b, N2, M0) non-small cell lung cancer, adenocarcinoma with negative EGFR mutation and negative ALK gene translocation  PRIOR THERAPY: Concurrent chemoradiation with weekly carboplatin for an AUC of 2 and paclitaxel at 45 mg per meter squared concurrent with radiation therapy under the care of Dr. Mitzi Hansen. He status post 7 cycles of chemotherapy. Last dose was given 02/11/2013 with partial response.  CURRENT THERAPY: None.  DISEASE STAGE: Stage IIIA (T1b, N2, M0)  CHEMOTHERAPY INTENT: Control/curative  CURRENT # OF CHEMOTHERAPY CYCLES: 7  CURRENT ANTIEMETICS: Zofran, dexamethasone, and Compazine for at home use  CURRENT SMOKING STATUS: Former smoker quit 12/20/1992  ORAL CHEMOTHERAPY AND CONSENT: n/a  CURRENT BISPHOSPHONATES USE: None  PAIN MANAGEMENT: Dilaudid  NARCOTICS INDUCED CONSTIPATION: None  LIVING WILL AND CODE STATUS: ?   INTERVAL HISTORY: Christopher Roach 79 y.o. male returns for followup visit accompanied by several family members. The patient is doing fine today with no specific complaints except for occasional pain at the left side rib cage. He was treated recently by his primary care physician with 10 days of Levaquin for questionable pneumonia. He denied having any significant shortness breath, cough or hemoptysis. The patient denied having any nausea or vomiting. He denied having any fever or chills. He has no weight loss or night sweats. The patient tolerated the previous course of concurrent chemoradiation fairly well with no significant adverse effects. He had repeat CT scan of the chest performed recently and he is here for evaluation and discussion of his scan results.  MEDICAL HISTORY: Past Medical History  Diagnosis Date  . Hypertension   . Hyperlipidemia   .  Hypothyroidism   . Abnormal LFTs   . Gallbladder polyp   . BPH (benign prostatic hyperplasia)   . Actinic keratosis   . Lung nodule     benign BX 03/24/2008  . Anemia   . H/O asbestos exposure   . Pneumonia     hx of walking PNA  . Shortness of breath     with exertion  . Constipation     from medications  . Arthritis   . Kidney stone August 2014    hx of  . Birth mark     left arm and shoulder; Dark purple on upper L chest ;NOT A RASH    ALLERGIES:  is allergic to codeine.  MEDICATIONS:  Current Outpatient Prescriptions  Medication Sig Dispense Refill  . Alum & Mag Hydroxide-Simeth (MAGIC MOUTHWASH W/LIDOCAINE) SOLN 1part nystatin,1part Maaloxplus,1part benadryl,3part 2%viscous lidocaine. Swallow 10 mL up to QID, before meals/bedtime  480 mL  1  . amLODipine (NORVASC) 10 MG tablet Take 10 mg by mouth at bedtime.       Marland Kitchen aspirin EC 81 MG tablet Take 81 mg by mouth daily.      Marland Kitchen emollient (BIAFINE) cream Apply 1 application topically daily.      Marland Kitchen levothyroxine (SYNTHROID, LEVOTHROID) 125 MCG tablet Take 125 mcg by mouth at bedtime.       . prochlorperazine (COMPAZINE) 10 MG tablet Take 1 tablet (10 mg total) by mouth every 6 (six) hours as needed.  60 tablet  0  . rosuvastatin (CRESTOR) 10 MG tablet Take 10 mg by mouth daily.      . tamsulosin (FLOMAX) 0.4 MG CAPS Take 0.4 mg by mouth at bedtime.       Marland Kitchen  zolpidem (AMBIEN) 10 MG tablet Take 10 mg by mouth at bedtime.        No current facility-administered medications for this visit.    SURGICAL HISTORY:  Past Surgical History  Procedure Laterality Date  . Back surgery      Dr Fannie Knee 1978 and 1982  . Cervical disc surgery    . Right foot surgery      plantar fascitis  . Right shoulder surgery      torn rotator cuff  . Right ankle fracture         . Right wrist fracture    . Foot surgery Left   . Tonsillectomy    . Appendectomy    . Colonoscopy    . Flexible bronchoscopy N/A 12/07/2012    Procedure: FLEXIBLE  BRONCHOSCOPY;  Surgeon: [name], MD;  Location: [facility] OR;  Service: Thoracic;  Laterality: N/A;  . Video assisted thoracoscopy (vats)/thorocotomy Left 12/07/2012    Procedure: VIDEO ASSISTED THORACOSCOPY (VATS)/ EXPLORATORY THOROCOTOMY;  Surgeon: [name], MD;  Location: [facility] OR;  Service: Thoracic;  Laterality: Left;    REVIEW OF SYSTEMS:  Constitutional: negative Eyes: negative Ears, nose, mouth, throat, and face: negative Respiratory: positive for pleurisy/chest pain Cardiovascular: negative Gastrointestinal: negative Genitourinary:negative Integument/breast: negative Hematologic/lymphatic: negative Musculoskeletal:negative Neurological: negative Behavioral/Psych: negative Endocrine: negative Allergic/Immunologic: negative   PHYSICAL EXAMINATION: General appearance: alert, cooperative, appears stated age and no distress Head: Normocephalic, without obvious abnormality, atraumatic Neck: no adenopathy, no carotid bruit, no JVD, supple, symmetrical, trachea midline and thyroid not enlarged, symmetric, no tenderness/mass/nodules Lymph nodes: Cervical, supraclavicular, and axillary nodes normal. Resp: clear to auscultation bilaterally Cardio: regular rate and rhythm, S1, S2 normal, no murmur, click, rub or gallop GI: soft, non-tender; bowel sounds normal; no masses,  no organomegaly Extremities: edema Trace pitting edema bilateral lower extremities, left greater than the right Neurologic: Alert and oriented X 3, normal strength and tone. Normal symmetric reflexes. Normal coordination and gait  ECOG PERFORMANCE STATUS: 1 - Symptomatic but completely ambulatory  Blood pressure 123/70, pulse 91, temperature 97.2 F (36.2 C), temperature source Oral, resp. rate 18, height 5\' 8"  (1.727 m), weight 186 lb 8 oz (84.596 kg), SpO2 97.00%.  LABORATORY DATA: Lab Results  Component Value Date   WBC 4.8 03/18/2013   HGB 11.4* 03/18/2013   HCT 33.9* 03/18/2013   MCV 94.6 03/18/2013   PLT  82* 03/18/2013      Chemistry      Component Value Date/Time   NA 142 03/18/2013 1052   NA 132* 12/11/2012 0505   K 3.9 03/18/2013 1052   K 4.9 12/11/2012 0505   CL 97 12/11/2012 0505   CO2 26 03/18/2013 1052   CO2 28 12/11/2012 0505   BUN 14.5 03/18/2013 1052   BUN 22 12/11/2012 0505   CREATININE 1.0 03/18/2013 1052   CREATININE 1.25 12/11/2012 0505      Component Value Date/Time   CALCIUM 9.1 03/18/2013 1052   CALCIUM 8.7 12/11/2012 0505   ALKPHOS 137 03/18/2013 1052   ALKPHOS 129* 12/05/2012 1348   AST 37* 03/18/2013 1052   AST 44* 12/05/2012 1348   ALT 31 03/18/2013 1052   ALT 35 12/05/2012 1348   BILITOT 0.39 03/18/2013 1052   BILITOT 0.5 12/05/2012 1348       RADIOGRAPHIC STUDIES:  Ct Chest Wo Contrast  03/18/2013   CLINICAL DATA:  Non-small cell lung cancer of the left upper lobe. . Status post chemotherapy and radiation. Now with shortness of breath  and left-sided chest wall pain.  EXAM: CT CHEST WITHOUT CONTRAST  TECHNIQUE: Multidetector CT imaging of the chest was performed following the standard protocol without IV contrast.  COMPARISON:  10/23/2012  FINDINGS: There is no axillary lymphadenopathy. A regular soft tissue nodule in the AP window, along the medial left lung pleura has decreased in the interval, measuring 2.2 x 1.3 cm today compared to 3.6 x 2.3 cm previously, when Re measuring in the same dimensions. This lesion has a somewhat irregular shape making intra study reproducible measurement more difficult.  The 9 mm AP window lymph node is stable in the interval.  Volume loss in the left hemi thorax is consistent with the history of lobectomy. There is posterior pleural thickening on the left. No pericardial or pleural effusion. Coronary artery calcification is noted.  Lung windows show emphysema with compensatory hyperexpansion of the right lung. Calcified pleural plaque is seen in the anterior right hemi thorax with scattered areas of subtle subpleural nodularity. The tree in bud opacity  involving the posterior right lower lobe inferiorly is stable.  3.1 x 1.4 cm nodular opacity in the left lower lobe posterior laterally is unchanged and may represent rounded atelectasis or scarring given the pattern of lung architecture around it.  Bone windows reveal no worrisome lytic or sclerotic osseous lesions. Images which include the upper abdomen show stable nodularity of the liver contour, specially in the left lobe. 12 mm left adrenal nodule is stable.  IMPRESSION: Interval decrease in size of the left upper paramediastinal/AP window lesion with associated decrease in size of a borderline AP window lymph node seen previously.  Stable 3.1 cm nodular opacity in the left lower lung peripherally, likely reflecting rounded atelectasis.  Stable appearance of tree-in-bud opacity involving the inferior aspect of the posterior right lower lobe. This could be related to chronic aspiration or atypical infection.  Stable 12 mm left adrenal nodule.   Electronically Signed   By: Kennith Center M.D.   On: 03/18/2013 14:10   ASSESSMENT/PLAN: This is a very pleasant 79 years old white male recently diagnosed with a stage IIIa non-small cell lung cancer currently undergoing concurrent chemoradiation with weekly carboplatin and paclitaxel is status post 7 cycles. The patient tolerated his treatment fairly well with no significant adverse effects. His recent CT scan of the chest showed partial response with decrease in the size of the left upper lobe paramediastinal mass in addition to decrease in the AP window lymphadenopathy. I discussed the scan results and showed the images to the patient and his family. I gave the patient the option of continuous observation with close monitoring versus consideration of 3 cycles of consolidation chemotherapy. The patient is not interested in consolidation chemotherapy at this point. I would see him back for followup visit in 3 months with repeat CT scan of the chest for restaging of  his disease. He was advised to call immediately if he has any concerning symptoms in the interval. The patient was advised to call immediately if he has any concerning symptoms in the interval. All questions were answered. The patient knows to call the clinic with any problems, questions or concerns. We can certainly see the patient much sooner if necessary. I spent 15 minutes counseling the patient face to face. The total time spent in the appointment was 25 minutes.   Lajuana Matte., MD 03/20/2013

## 2013-05-02 ENCOUNTER — Telehealth: Payer: Self-pay | Admitting: Internal Medicine

## 2013-05-02 NOTE — Telephone Encounter (Signed)
lvm for pt regarding to March appts...mailed appt sched, avs and letter to pt

## 2013-06-17 ENCOUNTER — Ambulatory Visit (HOSPITAL_COMMUNITY)
Admission: RE | Admit: 2013-06-17 | Discharge: 2013-06-17 | Disposition: A | Discharge status: Home / Self Care | Payer: Medicare Other | Source: Ambulatory Visit | Attending: Internal Medicine | Admitting: Internal Medicine

## 2013-06-17 ENCOUNTER — Encounter (HOSPITAL_COMMUNITY): Payer: Self-pay

## 2013-06-17 ENCOUNTER — Other Ambulatory Visit (HOSPITAL_BASED_OUTPATIENT_CLINIC_OR_DEPARTMENT_OTHER): Payer: Medicare Other

## 2013-06-17 DIAGNOSIS — R0602 Shortness of breath: Secondary | ICD-10-CM | POA: Insufficient documentation

## 2013-06-17 DIAGNOSIS — R079 Chest pain, unspecified: Secondary | ICD-10-CM | POA: Insufficient documentation

## 2013-06-17 DIAGNOSIS — C3490 Malignant neoplasm of unspecified part of unspecified bronchus or lung: Secondary | ICD-10-CM | POA: Insufficient documentation

## 2013-06-17 DIAGNOSIS — C3410 Malignant neoplasm of upper lobe, unspecified bronchus or lung: Secondary | ICD-10-CM

## 2013-06-17 LAB — COMPREHENSIVE METABOLIC PANEL (CC13)
ALBUMIN: 3.7 g/dL (ref 3.5–5.0)
ALK PHOS: 166 U/L — AB (ref 40–150)
ALT: 30 U/L (ref 0–55)
AST: 36 U/L — AB (ref 5–34)
Anion Gap: 9 mEq/L (ref 3–11)
BILIRUBIN TOTAL: 0.48 mg/dL (ref 0.20–1.20)
BUN: 14.6 mg/dL (ref 7.0–26.0)
CO2: 26 mEq/L (ref 22–29)
Calcium: 9.5 mg/dL (ref 8.4–10.4)
Chloride: 106 mEq/L (ref 98–109)
Creatinine: 1.1 mg/dL (ref 0.7–1.3)
Glucose: 122 mg/dl (ref 70–140)
POTASSIUM: 4.3 meq/L (ref 3.5–5.1)
Sodium: 140 mEq/L (ref 136–145)
Total Protein: 7.4 g/dL (ref 6.4–8.3)

## 2013-06-17 LAB — CBC WITH DIFFERENTIAL/PLATELET
BASO%: 1.1 % (ref 0.0–2.0)
Basophils Absolute: 0.1 10*3/uL (ref 0.0–0.1)
EOS%: 1.8 % (ref 0.0–7.0)
Eosinophils Absolute: 0.1 10*3/uL (ref 0.0–0.5)
HCT: 38.1 % — ABNORMAL LOW (ref 38.4–49.9)
HGB: 12.4 g/dL — ABNORMAL LOW (ref 13.0–17.1)
LYMPH%: 14.4 % (ref 14.0–49.0)
MCH: 29.4 pg (ref 27.2–33.4)
MCHC: 32.5 g/dL (ref 32.0–36.0)
MCV: 90.4 fL (ref 79.3–98.0)
MONO#: 0.5 10*3/uL (ref 0.1–0.9)
MONO%: 7.7 % (ref 0.0–14.0)
NEUT#: 4.7 10*3/uL (ref 1.5–6.5)
NEUT%: 75 % (ref 39.0–75.0)
PLATELETS: 140 10*3/uL (ref 140–400)
RBC: 4.21 10*6/uL (ref 4.20–5.82)
RDW: 14 % (ref 11.0–14.6)
WBC: 6.3 10*3/uL (ref 4.0–10.3)
lymph#: 0.9 10*3/uL (ref 0.9–3.3)

## 2013-06-17 MED ORDER — IOHEXOL 300 MG/ML  SOLN
80.0000 mL | Freq: Once | INTRAMUSCULAR | Status: AC | PRN
  Administered 2013-06-17: 80 mL via INTRAVENOUS

## 2013-06-18 ENCOUNTER — Ambulatory Visit (HOSPITAL_BASED_OUTPATIENT_CLINIC_OR_DEPARTMENT_OTHER): Payer: Medicare Other | Admitting: Internal Medicine

## 2013-06-18 ENCOUNTER — Encounter: Payer: Self-pay | Admitting: Internal Medicine

## 2013-06-18 VITALS — BP 157/57 | HR 65 | Temp 97.9°F | Resp 18 | Ht 68.0 in | Wt 190.0 lb

## 2013-06-18 DIAGNOSIS — C3410 Malignant neoplasm of upper lobe, unspecified bronchus or lung: Secondary | ICD-10-CM

## 2013-06-18 DIAGNOSIS — R079 Chest pain, unspecified: Secondary | ICD-10-CM

## 2013-06-18 NOTE — Progress Notes (Signed)
Christopher Roach  Telephone:([phone]) [phone] [phone]  OFFICE VISIT PROGRESS NOTE  Christopher Roach, [address] [name] [address]  DIAGNOSIS: Stage IIIA (T1b, N2, M0) non-small cell lung cancer, adenocarcinoma with negative EGFR mutation and negative ALK gene translocation  PRIOR THERAPY: Concurrent chemoradiation with weekly carboplatin for an AUC of 2 and paclitaxel at 45 mg per meter squared concurrent with radiation therapy under the care of Dr. Lisbeth Renshaw. He status post 7 cycles of chemotherapy. Last dose was given 02/11/2013 with partial response.  CURRENT THERAPY: None.  DISEASE STAGE: Stage IIIA (T1b, N2, M0)  CHEMOTHERAPY INTENT: Control/curative  CURRENT # OF CHEMOTHERAPY CYCLES: 7  CURRENT ANTIEMETICS: Zofran, dexamethasone, and Compazine for at home use  CURRENT SMOKING STATUS: Former smoker quit 12/20/1992  ORAL CHEMOTHERAPY AND CONSENT: n/a  CURRENT BISPHOSPHONATES USE: None  PAIN MANAGEMENT: Dilaudid  NARCOTICS INDUCED CONSTIPATION: None  LIVING WILL AND CODE STATUS: ?   INTERVAL HISTORY: Christopher Roach 80 y.o. male returns for followup visit accompanied by several family members. The patient is doing fine today with no specific complaints except for pain at the left side rib cage. He denied having any significant shortness breath, cough or hemoptysis. The patient denied having any nausea or vomiting. He denied having any fever or chills. He has no weight loss or night sweats. He had repeat CT scan of the chest performed recently and he is here for evaluation and discussion of his scan results.  MEDICAL HISTORY: Past Medical History  Diagnosis Date  . Hypertension   . Hyperlipidemia   . Hypothyroidism   . Abnormal LFTs   . Gallbladder polyp   . BPH (benign prostatic hyperplasia)   . Actinic keratosis   . Lung nodule     benign BX 03/24/2008  . Anemia   . H/O asbestos exposure   . Pneumonia     hx of walking PNA    . Shortness of breath     with exertion  . Constipation     from medications  . Arthritis   . Kidney stone August 2014    hx of  . Birth mark     left arm and shoulder; Dark purple on upper L chest ;NOT A RASH    ALLERGIES:  is allergic to codeine.  MEDICATIONS:  Current Outpatient Prescriptions  Medication Sig Dispense Refill  . Alum & Mag Hydroxide-Simeth (MAGIC MOUTHWASH W/LIDOCAINE) SOLN 1part nystatin,1part Maaloxplus,1part benadryl,3part 2%viscous lidocaine. Swallow 10 mL up to QID, 34min before meals/bedtime  480 mL  1  . amLODipine (NORVASC) 10 MG tablet Take 10 mg by mouth at bedtime.       Marland Kitchen aspirin EC 81 MG tablet Take 81 mg by mouth daily.      Marland Kitchen levothyroxine (SYNTHROID, LEVOTHROID) 125 MCG tablet Take 125 mcg by mouth at bedtime.       . rosuvastatin (CRESTOR) 10 MG tablet Take 10 mg by mouth daily.      . tamsulosin (FLOMAX) 0.4 MG CAPS Take 0.4 mg by mouth at bedtime.       Marland Kitchen zolpidem (AMBIEN) 10 MG tablet Take 10 mg by mouth at bedtime.        No current facility-administered medications for this visit.    SURGICAL HISTORY:  Past Surgical History  Procedure Laterality Date  . Back surgery      Dr Collie Siad 1978 and 1982  . Cervical disc surgery    . Right foot surgery  plantar fascitis  . Right shoulder surgery      torn rotator cuff  . Right ankle fracture         . Right wrist fracture    . Foot surgery Left   . Tonsillectomy    . Appendectomy    . Colonoscopy    . Flexible bronchoscopy N/A 12/07/2012    Procedure: FLEXIBLE BRONCHOSCOPY;  Surgeon: [name], MD;  Location: [facility] OR;  Service: Thoracic;  Laterality: N/A;  . Video assisted thoracoscopy (vats)/thorocotomy Left 12/07/2012    Procedure: VIDEO ASSISTED THORACOSCOPY (VATS)/ EXPLORATORY THOROCOTOMY;  Surgeon: [name], MD;  Location: [facility] OR;  Service: Thoracic;  Laterality: Left;    REVIEW OF SYSTEMS:  Constitutional: negative Eyes: negative Ears, nose, mouth, throat, and face:  negative Respiratory: positive for pleurisy/chest pain Cardiovascular: negative Gastrointestinal: negative Genitourinary:negative Integument/breast: negative Hematologic/lymphatic: negative Musculoskeletal:negative Neurological: negative Behavioral/Psych: negative Endocrine: negative Allergic/Immunologic: negative   PHYSICAL EXAMINATION: General appearance: alert, cooperative, appears stated age and no distress Head: Normocephalic, without obvious abnormality, atraumatic Neck: no adenopathy, no carotid bruit, no JVD, supple, symmetrical, trachea midline and thyroid not enlarged, symmetric, no tenderness/mass/nodules Lymph nodes: Cervical, supraclavicular, and axillary nodes normal. Resp: clear to auscultation bilaterally Cardio: regular rate and rhythm, S1, S2 normal, no murmur, click, rub or gallop GI: soft, non-tender; bowel sounds normal; no masses,  no organomegaly Extremities: edema Trace pitting edema bilateral lower extremities, left greater than the right Neurologic: Alert and oriented X 3, normal strength and tone. Normal symmetric reflexes. Normal coordination and gait  ECOG PERFORMANCE STATUS: 1 - Symptomatic but completely ambulatory  Blood pressure 157/57, pulse 65, temperature 97.9 F (36.6 C), temperature source Oral, resp. rate 18, height 5\' 8"  (1.727 m), weight 190 lb (86.183 kg), SpO2 96.00%.  LABORATORY DATA: Lab Results  Component Value Date   WBC 6.3 06/17/2013   HGB 12.4* 06/17/2013   HCT 38.1* 06/17/2013   MCV 90.4 06/17/2013   PLT 140 06/17/2013      Chemistry      Component Value Date/Time   NA 140 06/17/2013 1309   NA 132* 12/11/2012 0505   K 4.3 06/17/2013 1309   K 4.9 12/11/2012 0505   CL 97 12/11/2012 0505   CO2 26 06/17/2013 1309   CO2 28 12/11/2012 0505   BUN 14.6 06/17/2013 1309   BUN 22 12/11/2012 0505   CREATININE 1.1 06/17/2013 1309   CREATININE 1.25 12/11/2012 0505      Component Value Date/Time   CALCIUM 9.5 06/17/2013 1309   CALCIUM 8.7 12/11/2012 0505    ALKPHOS 166* 06/17/2013 1309   ALKPHOS 129* 12/05/2012 1348   AST 36* 06/17/2013 1309   AST 44* 12/05/2012 1348   ALT 30 06/17/2013 1309   ALT 35 12/05/2012 1348   BILITOT 0.48 06/17/2013 1309   BILITOT 0.5 12/05/2012 1348       RADIOGRAPHIC STUDIES:  Ct Chest W Contrast  06/17/2013   CLINICAL DATA:  Lung cancer diagnosed August 2014. Left-sided chest pain and shortness of breath. Completed chemotherapy and radiation.  EXAM: CT CHEST WITH CONTRAST  TECHNIQUE: Multidetector CT imaging of the chest was performed during intravenous contrast administration.  CONTRAST:  42mL OMNIPAQUE IOHEXOL 300 MG/ML  SOLN  COMPARISON:  CT CHEST W/O CM dated 03/18/2013  FINDINGS: Further evolution of left hemithoracic paramediastinal radiation change is identified with volume loss of the left hemi thorax progressive since the prior exam. There is minimal residual masslike thickening in the AP window now measuring  2.7 x 0.7 cm image 23, subjectively less full than previously. An adjacent AP window lymph node measures 0.8 cm image 24, unchanged to slightly smaller. Heart size is normal. Epicardial lymph node measuring 0.7 cm image 50 is stable. Left pleural thickening is identified. No new pleural effusion or pericardial effusion is present.  Incomplete imaging of the upper abdomen re- demonstrates a low-density 1.1 cm left adrenal mass image 60, measuring fat density suggestive of an adenoma. The right adrenal gland is unremarkable.  Left greater than right upper lobe consolidation with internal air bronchogram formation is noted most compatible with radiation change. The area of probable rounded atelectasis is reidentified at the left lung base measuring 3.2 x 1.4 cm image 42, stable. Calcified pleural plaques are reidentified likely indicating asbestos exposure. A few areas of subpleural scarring at the right lung base are stable.  Right humeral bone anchors are identified. No acute osseous abnormality or lytic or sclerotic osseous  lesion.  IMPRESSION: Interval presumed evolution of radiation fibrosis involving the left greater than right upper lobes. This likely accounts for the presence of increased left hemithoracic volume loss compared to previously.  The dominant AP window mass is slightly smaller than previously. No new evidence for intrathoracic metastatic disease.  Calcified pleural plaques reidentified, compatible with previous exposure to asbestos.   Electronically Signed   By: Conchita Paris M.D.   On: 06/17/2013 16:53   ASSESSMENT/PLAN: This is a very pleasant 80 years old white male recently diagnosed with a stage IIIa non-small cell lung cancer currently undergoing concurrent chemoradiation with weekly carboplatin and paclitaxel is status post 7 cycles.  Recent CT scan of the chest showed no evidence for disease progression I discussed the scan results with the patient and his family and showed them the images. I recommended for him to continue on observation with repeat CT scan of the chest in 3 months. He was advised to call immediately if he has any concerning symptoms in the interval. The patient was advised to call immediately if he has any concerning symptoms in the interval. All questions were answered. The patient knows to call the clinic with any problems, questions or concerns. We can certainly see the patient much sooner if necessary.  Disclaimer: This note was dictated with voice recognition software. Similar sounding words can inadvertently be transcribed and may not be corrected upon review.   Eilleen Kempf., MD 06/18/2013

## 2013-07-05 DIAGNOSIS — Z0279 Encounter for issue of other medical certificate: Secondary | ICD-10-CM

## 2013-07-26 ENCOUNTER — Encounter (HOSPITAL_COMMUNITY): Payer: Self-pay

## 2013-08-01 ENCOUNTER — Telehealth: Payer: Self-pay | Admitting: *Deleted

## 2013-08-01 NOTE — Telephone Encounter (Signed)
On 08-01-13 fax medical records to [facility] it was consult note,sim & tx planning note, end of tx note, follow up note,

## 2013-09-17 ENCOUNTER — Encounter (HOSPITAL_COMMUNITY): Payer: Self-pay

## 2013-09-17 ENCOUNTER — Other Ambulatory Visit (HOSPITAL_BASED_OUTPATIENT_CLINIC_OR_DEPARTMENT_OTHER): Payer: Medicare Other

## 2013-09-17 ENCOUNTER — Ambulatory Visit (HOSPITAL_COMMUNITY)
Admission: RE | Admit: 2013-09-17 | Discharge: 2013-09-17 | Disposition: A | Discharge status: Home / Self Care | Payer: Medicare Other | Source: Ambulatory Visit | Attending: Internal Medicine | Admitting: Internal Medicine

## 2013-09-17 DIAGNOSIS — I700 Atherosclerosis of aorta: Secondary | ICD-10-CM | POA: Insufficient documentation

## 2013-09-17 DIAGNOSIS — C3410 Malignant neoplasm of upper lobe, unspecified bronchus or lung: Secondary | ICD-10-CM

## 2013-09-17 DIAGNOSIS — C3490 Malignant neoplasm of unspecified part of unspecified bronchus or lung: Secondary | ICD-10-CM | POA: Insufficient documentation

## 2013-09-17 LAB — CBC WITH DIFFERENTIAL/PLATELET
BASO%: 0.9 % (ref 0.0–2.0)
BASOS ABS: 0.1 10*3/uL (ref 0.0–0.1)
EOS%: 2.1 % (ref 0.0–7.0)
Eosinophils Absolute: 0.2 10*3/uL (ref 0.0–0.5)
HEMATOCRIT: 37.1 % — AB (ref 38.4–49.9)
HEMOGLOBIN: 12 g/dL — AB (ref 13.0–17.1)
LYMPH%: 18.4 % (ref 14.0–49.0)
MCH: 29.7 pg (ref 27.2–33.4)
MCHC: 32.3 g/dL (ref 32.0–36.0)
MCV: 91.8 fL (ref 79.3–98.0)
MONO#: 0.8 10*3/uL (ref 0.1–0.9)
MONO%: 10.3 % (ref 0.0–14.0)
NEUT#: 5.3 10*3/uL (ref 1.5–6.5)
NEUT%: 68.3 % (ref 39.0–75.0)
Platelets: 169 10*3/uL (ref 140–400)
RBC: 4.04 10*6/uL — ABNORMAL LOW (ref 4.20–5.82)
RDW: 14.5 % (ref 11.0–14.6)
WBC: 7.7 10*3/uL (ref 4.0–10.3)
lymph#: 1.4 10*3/uL (ref 0.9–3.3)

## 2013-09-17 LAB — COMPREHENSIVE METABOLIC PANEL (CC13)
ALT: 22 U/L (ref 0–55)
ANION GAP: 8 meq/L (ref 3–11)
AST: 25 U/L (ref 5–34)
Albumin: 3.4 g/dL — ABNORMAL LOW (ref 3.5–5.0)
Alkaline Phosphatase: 135 U/L (ref 40–150)
BUN: 14.1 mg/dL (ref 7.0–26.0)
CALCIUM: 8.9 mg/dL (ref 8.4–10.4)
CO2: 27 meq/L (ref 22–29)
CREATININE: 1 mg/dL (ref 0.7–1.3)
Chloride: 107 mEq/L (ref 98–109)
GLUCOSE: 67 mg/dL — AB (ref 70–140)
Potassium: 4.2 mEq/L (ref 3.5–5.1)
Sodium: 142 mEq/L (ref 136–145)
Total Bilirubin: 0.49 mg/dL (ref 0.20–1.20)
Total Protein: 7.3 g/dL (ref 6.4–8.3)

## 2013-09-17 MED ORDER — IOHEXOL 300 MG/ML  SOLN
80.0000 mL | Freq: Once | INTRAMUSCULAR | Status: AC | PRN
  Administered 2013-09-17: 80 mL via INTRAVENOUS

## 2013-09-19 ENCOUNTER — Ambulatory Visit (HOSPITAL_BASED_OUTPATIENT_CLINIC_OR_DEPARTMENT_OTHER): Payer: Medicare Other | Admitting: Internal Medicine

## 2013-09-19 ENCOUNTER — Telehealth: Payer: Self-pay | Admitting: Internal Medicine

## 2013-09-19 ENCOUNTER — Encounter: Payer: Self-pay | Admitting: Internal Medicine

## 2013-09-19 VITALS — BP 134/55 | HR 80 | Temp 98.1°F | Resp 18 | Ht 68.0 in | Wt 189.4 lb

## 2013-09-19 DIAGNOSIS — C3490 Malignant neoplasm of unspecified part of unspecified bronchus or lung: Secondary | ICD-10-CM

## 2013-09-19 DIAGNOSIS — R079 Chest pain, unspecified: Secondary | ICD-10-CM

## 2013-09-19 DIAGNOSIS — C3410 Malignant neoplasm of upper lobe, unspecified bronchus or lung: Secondary | ICD-10-CM

## 2013-09-19 NOTE — Progress Notes (Signed)
[facility]  Telephone:(336) [phone] Fax:(336) [phone]  OFFICE VISIT PROGRESS NOTE  Christopher Roach, [name] [address]  DIAGNOSIS: Stage IIIA (T1b, N2, M0) non-small cell lung cancer, adenocarcinoma with negative EGFR mutation and negative ALK gene translocation  PRIOR THERAPY: Concurrent chemoradiation with weekly carboplatin for an AUC of 2 and paclitaxel at 45 mg per meter squared concurrent with radiation therapy under the care of Dr. Lisbeth Renshaw. He status post 7 cycles of chemotherapy. Last dose was given 02/11/2013 with partial response.  CURRENT THERAPY: None.  DISEASE STAGE: Stage IIIA (T1b, N2, M0)  CHEMOTHERAPY INTENT: Control/curative  CURRENT # OF CHEMOTHERAPY CYCLES: 0  CURRENT ANTIEMETICS: Zofran, dexamethasone, and Compazine for at home use  CURRENT SMOKING STATUS: Former smoker quit 12/20/1992  ORAL CHEMOTHERAPY AND CONSENT: n/a  CURRENT BISPHOSPHONATES USE: None  PAIN MANAGEMENT: Dilaudid  NARCOTICS INDUCED CONSTIPATION: None  LIVING WILL AND CODE STATUS: ?  INTERVAL HISTORY: Christopher Roach 80 y.o. male returns for followup visit accompanied by his 2 daughters. He has been observation for the last few months. The patient is doing fine today with no specific complaints except for intermittent pain at the left side rib cage secondary to his surgical scar. He denied having any significant shortness of breath, cough or hemoptysis. The patient denied having any nausea or vomiting. He denied having any fever or chills. He has no weight loss or night sweats. He had repeat CT scan of the chest performed recently and he is here for evaluation and discussion of his scan results.  MEDICAL HISTORY: Past Medical History  Diagnosis Date  . Hypertension   . Hyperlipidemia   . Hypothyroidism   . Abnormal LFTs   . Gallbladder polyp   . BPH (benign prostatic hyperplasia)   . Actinic keratosis   . Lung nodule     benign BX  03/24/2008  . Anemia   . H/O asbestos exposure   . Pneumonia     hx of walking PNA  . Shortness of breath     with exertion  . Constipation     from medications  . Arthritis   . Kidney stone August 2014    hx of  . Birth mark     left arm and shoulder; Dark purple on upper L chest ;NOT A RASH    ALLERGIES:  is allergic to codeine.  MEDICATIONS:  Current Outpatient Prescriptions  Medication Sig Dispense Refill  . Alum & Mag Hydroxide-Simeth (MAGIC MOUTHWASH W/LIDOCAINE) SOLN 1part nystatin,1part Maaloxplus,1part benadryl,3part 2%viscous lidocaine. Swallow 10 mL up to QID, 56mn before meals/bedtime  480 mL  1  . amLODipine (NORVASC) 10 MG tablet Take 10 mg by mouth at bedtime.       .Marland Kitchenaspirin EC 81 MG tablet Take 81 mg by mouth daily.      .Marland Kitchenlevothyroxine (SYNTHROID, LEVOTHROID) 125 MCG tablet Take 125 mcg by mouth at bedtime.       . rosuvastatin (CRESTOR) 10 MG tablet Take 10 mg by mouth daily.      . tamsulosin (FLOMAX) 0.4 MG CAPS Take 0.4 mg by mouth at bedtime.       .Marland Kitchenzolpidem (AMBIEN) 10 MG tablet Take 10 mg by mouth at bedtime.        No current facility-administered medications for this visit.    SURGICAL HISTORY:  Past Surgical History  Procedure Laterality Date  . Back surgery      Dr SCollie Siad1978 and 1982  .  Cervical disc surgery    . Right foot surgery      plantar fascitis  . Right shoulder surgery      torn rotator cuff  . Right ankle fracture         . Right wrist fracture    . Foot surgery Left   . Tonsillectomy    . Appendectomy    . Colonoscopy    . Flexible bronchoscopy N/A 12/07/2012    Procedure: FLEXIBLE BRONCHOSCOPY;  Surgeon: Gaye Pollack, MD;  Location: [facility] OR;  Service: Thoracic;  Laterality: N/A;  . Video assisted thoracoscopy (vats)/thorocotomy Left 12/07/2012    Procedure: VIDEO ASSISTED THORACOSCOPY (VATS)/ EXPLORATORY THOROCOTOMY;  Surgeon: Gaye Pollack, MD;  Location: [facility] OR;  Service: Thoracic;  Laterality: Left;    REVIEW OF  SYSTEMS:  Constitutional: negative Eyes: negative Ears, nose, mouth, throat, and face: negative Respiratory: positive for pleurisy/chest pain Cardiovascular: negative Gastrointestinal: negative Genitourinary:negative Integument/breast: negative Hematologic/lymphatic: negative Musculoskeletal:negative Neurological: negative Behavioral/Psych: negative Endocrine: negative Allergic/Immunologic: negative   PHYSICAL EXAMINATION: General appearance: alert, cooperative, appears stated age and no distress Head: Normocephalic, without obvious abnormality, atraumatic Neck: no adenopathy, no carotid bruit, no JVD, supple, symmetrical, trachea midline and thyroid not enlarged, symmetric, no tenderness/mass/nodules Lymph nodes: Cervical, supraclavicular, and axillary nodes normal. Resp: clear to auscultation bilaterally Cardio: regular rate and rhythm, S1, S2 normal, no murmur, click, rub or gallop GI: soft, non-tender; bowel sounds normal; no masses,  no organomegaly Extremities: edema Trace pitting edema bilateral lower extremities, left greater than the right Neurologic: Alert and oriented X 3, normal strength and tone. Normal symmetric reflexes. Normal coordination and gait  ECOG PERFORMANCE STATUS: 1 - Symptomatic but completely ambulatory  Blood pressure 134/55, pulse 80, temperature 98.1 F (36.7 C), temperature source Oral, resp. rate 18, height _0  (1.727 m), weight 189 lb 6.4 oz (85.911 kg), SpO2 96.00%.  LABORATORY DATA: Lab Results  Component Value Date   WBC 7.7 09/17/2013   HGB 12.0* 09/17/2013   HCT 37.1* 09/17/2013   MCV 91.8 09/17/2013   PLT 169 09/17/2013      Chemistry      Component Value Date/Time   NA 142 09/17/2013 0958   NA 132* 12/11/2012 0505   K 4.2 09/17/2013 0958   K 4.9 12/11/2012 0505   CL 97 12/11/2012 0505   CO2 27 09/17/2013 0958   CO2 28 12/11/2012 0505   BUN 14.1 09/17/2013 0958   BUN 22 12/11/2012 0505   CREATININE 1.0 09/17/2013 0958   CREATININE 1.25 12/11/2012 0505       Component Value Date/Time   CALCIUM 8.9 09/17/2013 0958   CALCIUM 8.7 12/11/2012 0505   ALKPHOS 135 09/17/2013 0958   ALKPHOS 129* 12/05/2012 1348   AST 25 09/17/2013 0958   AST 44* 12/05/2012 1348   ALT 22 09/17/2013 0958   ALT 35 12/05/2012 1348   BILITOT 0.49 09/17/2013 0958   BILITOT 0.5 12/05/2012 1348       RADIOGRAPHIC STUDIES:  Ct Chest W Contrast  09/17/2013   CLINICAL DATA:  Restaging lung cancer post radiation and chemotherapy. Patient reports left chest pain with cough and shortness of breath.  EXAM: CT CHEST WITH CONTRAST  TECHNIQUE: Multidetector CT imaging of the chest was performed during intravenous contrast administration.  CONTRAST:  87m OMNIPAQUE IOHEXOL 300 MG/ML  SOLN  COMPARISON:  Prior examinations 06/17/2013 and 03/18/2013.  FINDINGS: There is stable volume loss in the left hemithorax with radiation changes medially and pleural thickening. Focus  of subpleural rounded atelectasis posterolaterally in the left lower lobe is stable. The right lung has a stable appearance with mild emphysema and scattered scarring. There are no new or enlarging pulmonary nodules.  A left supraclavicular lymph node is slightly more prominent, measuring 7 mm short axis on image 7. No enlarged mediastinal, hilar or axillary lymph nodes are seen. There is stable atherosclerosis of the aorta, great vessels and coronary arteries.  There is no significant pleural or pericardial effusion. Underlying calcified pleural plaques are present bilaterally.  Images through the upper abdomen demonstrate a stable small left adrenal adenoma. The liver demonstrates contour irregularity with recanalization of the left periumbilical vein consistent with cirrhosis.  Old rib fractures or thoracotomy defects are noted. There is a stable inferior endplate compression deformity at T8. No worrisome osseous findings are demonstrated.  IMPRESSION: Overall, little change is seen compared with the most recent study. A left supraclavicular  lymph node is minimally larger. However, there is no evidence of intra thoracic recurrence or definite metastatic disease.   Electronically Signed   By: Camie Patience M.D.   On: 09/17/2013 14:09   ASSESSMENT/PLAN: This is a very pleasant 80 years old white male recently diagnosed with a stage IIIa non-small cell lung cancer currently undergoing concurrent chemoradiation with weekly carboplatin and paclitaxel status post 7 cycles.  The recent CT scan of the chest performed 2 days ago showed no evidence for disease progression I discussed the scan results with the patient and his family and showed them the images. I recommended for him to continue on observation with repeat CT scan of the chest in 3 months. He was advised to call immediately if he has any concerning symptoms in the interval. The patient was advised to call immediately if he has any concerning symptoms in the interval. All questions were answered. The patient knows to call the clinic with any problems, questions or concerns. We can certainly see the patient much sooner if necessary.  Disclaimer: This note was dictated with voice recognition software. Similar sounding words can inadvertently be transcribed and may not be corrected upon review.   Eilleen Kempf., MD 09/19/2013

## 2013-09-19 NOTE — Telephone Encounter (Signed)
Gave pt appt for lab and MD for September

## 2013-10-01 ENCOUNTER — Encounter (HOSPITAL_COMMUNITY): Payer: Self-pay

## 2013-12-10 ENCOUNTER — Telehealth: Payer: Self-pay | Admitting: Medical Oncology

## 2013-12-10 NOTE — Telephone Encounter (Signed)
appt confirmed

## 2013-12-13 ENCOUNTER — Other Ambulatory Visit: Payer: Medicare Other

## 2013-12-19 ENCOUNTER — Telehealth: Payer: Self-pay | Admitting: *Deleted

## 2013-12-19 ENCOUNTER — Encounter (HOSPITAL_COMMUNITY): Payer: Self-pay

## 2013-12-19 ENCOUNTER — Ambulatory Visit (HOSPITAL_COMMUNITY)
Admission: RE | Admit: 2013-12-19 | Discharge: 2013-12-19 | Disposition: A | Discharge status: Home / Self Care | Payer: Medicare Other | Source: Ambulatory Visit | Attending: Internal Medicine | Admitting: Internal Medicine

## 2013-12-19 ENCOUNTER — Other Ambulatory Visit (HOSPITAL_BASED_OUTPATIENT_CLINIC_OR_DEPARTMENT_OTHER): Payer: Medicare Other

## 2013-12-19 DIAGNOSIS — I700 Atherosclerosis of aorta: Secondary | ICD-10-CM | POA: Insufficient documentation

## 2013-12-19 DIAGNOSIS — R599 Enlarged lymph nodes, unspecified: Secondary | ICD-10-CM | POA: Insufficient documentation

## 2013-12-19 DIAGNOSIS — I2789 Other specified pulmonary heart diseases: Secondary | ICD-10-CM | POA: Insufficient documentation

## 2013-12-19 DIAGNOSIS — K449 Diaphragmatic hernia without obstruction or gangrene: Secondary | ICD-10-CM | POA: Insufficient documentation

## 2013-12-19 DIAGNOSIS — J9819 Other pulmonary collapse: Secondary | ICD-10-CM | POA: Diagnosis not present

## 2013-12-19 DIAGNOSIS — K7460 Unspecified cirrhosis of liver: Secondary | ICD-10-CM | POA: Diagnosis not present

## 2013-12-19 DIAGNOSIS — R161 Splenomegaly, not elsewhere classified: Secondary | ICD-10-CM | POA: Insufficient documentation

## 2013-12-19 DIAGNOSIS — I359 Nonrheumatic aortic valve disorder, unspecified: Secondary | ICD-10-CM | POA: Insufficient documentation

## 2013-12-19 DIAGNOSIS — R918 Other nonspecific abnormal finding of lung field: Secondary | ICD-10-CM | POA: Insufficient documentation

## 2013-12-19 DIAGNOSIS — Z923 Personal history of irradiation: Secondary | ICD-10-CM | POA: Insufficient documentation

## 2013-12-19 DIAGNOSIS — C3490 Malignant neoplasm of unspecified part of unspecified bronchus or lung: Secondary | ICD-10-CM | POA: Diagnosis present

## 2013-12-19 DIAGNOSIS — Z9221 Personal history of antineoplastic chemotherapy: Secondary | ICD-10-CM | POA: Insufficient documentation

## 2013-12-19 DIAGNOSIS — C3410 Malignant neoplasm of upper lobe, unspecified bronchus or lung: Secondary | ICD-10-CM

## 2013-12-19 DIAGNOSIS — I708 Atherosclerosis of other arteries: Secondary | ICD-10-CM | POA: Insufficient documentation

## 2013-12-19 LAB — CBC WITH DIFFERENTIAL/PLATELET
BASO%: 1.3 % (ref 0.0–2.0)
Basophils Absolute: 0.1 10*3/uL (ref 0.0–0.1)
EOS%: 3.5 % (ref 0.0–7.0)
Eosinophils Absolute: 0.2 10*3/uL (ref 0.0–0.5)
HCT: 33.2 % — ABNORMAL LOW (ref 38.4–49.9)
HGB: 10.8 g/dL — ABNORMAL LOW (ref 13.0–17.1)
LYMPH%: 12.2 % — AB (ref 14.0–49.0)
MCH: 29.2 pg (ref 27.2–33.4)
MCHC: 32.5 g/dL (ref 32.0–36.0)
MCV: 89.6 fL (ref 79.3–98.0)
MONO#: 0.6 10*3/uL (ref 0.1–0.9)
MONO%: 9.2 % (ref 0.0–14.0)
NEUT#: 5 10*3/uL (ref 1.5–6.5)
NEUT%: 73.8 % (ref 39.0–75.0)
PLATELETS: 197 10*3/uL (ref 140–400)
RBC: 3.7 10*6/uL — AB (ref 4.20–5.82)
RDW: 14.7 % — AB (ref 11.0–14.6)
WBC: 6.7 10*3/uL (ref 4.0–10.3)
lymph#: 0.8 10*3/uL — ABNORMAL LOW (ref 0.9–3.3)

## 2013-12-19 LAB — COMPREHENSIVE METABOLIC PANEL (CC13)
ALBUMIN: 3 g/dL — AB (ref 3.5–5.0)
ALT: 25 U/L (ref 0–55)
AST: 29 U/L (ref 5–34)
Alkaline Phosphatase: 179 U/L — ABNORMAL HIGH (ref 40–150)
Anion Gap: 10 mEq/L (ref 3–11)
BUN: 14.8 mg/dL (ref 7.0–26.0)
CALCIUM: 9 mg/dL (ref 8.4–10.4)
CHLORIDE: 107 meq/L (ref 98–109)
CO2: 24 mEq/L (ref 22–29)
Creatinine: 1 mg/dL (ref 0.7–1.3)
Glucose: 104 mg/dl (ref 70–140)
Potassium: 4 mEq/L (ref 3.5–5.1)
Sodium: 141 mEq/L (ref 136–145)
Total Bilirubin: 0.75 mg/dL (ref 0.20–1.20)
Total Protein: 7.4 g/dL (ref 6.4–8.3)

## 2013-12-19 MED ORDER — IOHEXOL 300 MG/ML  SOLN
80.0000 mL | Freq: Once | INTRAMUSCULAR | Status: AC | PRN
  Administered 2013-12-19: 80 mL via INTRAVENOUS

## 2013-12-19 NOTE — Telephone Encounter (Signed)
Christopher Roach with [facility] called the following results of today"s CT Chest.  Will notify Dr. Julien Nordmann.  Next scheduled f/u will be Monday 12-23-2013.    MPRESSION:  1. Findings are highly concerning for progression of disease with  masslike area in the perihilar aspect of the left upper lobe, with  probable extension of malignant tissue throughout the apical  portions of the left upper lobe as well (versus postobstructive  changes). There is also progressively worsening left hilar and  mediastinal adenopathy, as detailed above.  2. 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 Stable round atelectasis in the left lower lobe.  3. Stigmata of cirrhosis with portal hypertension, including  splenomegaly again noted.

## 2013-12-23 ENCOUNTER — Telehealth: Payer: Self-pay | Admitting: Internal Medicine

## 2013-12-23 ENCOUNTER — Encounter: Payer: Self-pay | Admitting: Internal Medicine

## 2013-12-23 ENCOUNTER — Ambulatory Visit (HOSPITAL_BASED_OUTPATIENT_CLINIC_OR_DEPARTMENT_OTHER): Payer: Medicare Other | Admitting: Internal Medicine

## 2013-12-23 VITALS — BP 133/62 | HR 80 | Temp 98.4°F | Resp 18 | Ht 68.0 in | Wt 175.7 lb

## 2013-12-23 DIAGNOSIS — C3410 Malignant neoplasm of upper lobe, unspecified bronchus or lung: Secondary | ICD-10-CM

## 2013-12-23 DIAGNOSIS — C3492 Malignant neoplasm of unspecified part of left bronchus or lung: Secondary | ICD-10-CM

## 2013-12-23 NOTE — Telephone Encounter (Signed)
gv pt appt schedule for sept. 2wk f/u scheduled for 9/24 due to no avialability the wk of 9/28. pt aware [facility] will call re pet and if pet cannot be scheduled prior to 9/24 call me back and let me know.

## 2013-12-23 NOTE — Progress Notes (Signed)
Christopher Roach  [phone] [phone]  OFFICE VISIT PROGRESS NOTE  [name], [name] [address]  DIAGNOSIS: Stage IIIA (T1b, N2, M0) non-small cell lung cancer, adenocarcinoma with negative EGFR mutation and negative ALK gene translocation  PRIOR THERAPY: Concurrent chemoradiation with weekly carboplatin for an AUC of 2 and paclitaxel at 45 mg per meter squared concurrent with radiation therapy under the care of Dr. Lisbeth Renshaw. He status post 7 cycles of chemotherapy. Last dose was given 02/11/2013 with partial response.  CURRENT THERAPY: None.  DISEASE STAGE: Stage IIIA (T1b, N2, M0)  CHEMOTHERAPY INTENT: Control/curative  CURRENT # OF CHEMOTHERAPY CYCLES: 0  CURRENT ANTIEMETICS: Zofran, dexamethasone, and Compazine for at home use  CURRENT SMOKING STATUS: Former smoker quit 12/20/1992  ORAL CHEMOTHERAPY AND CONSENT: n/a  CURRENT BISPHOSPHONATES USE: None  PAIN MANAGEMENT: Dilaudid  NARCOTICS INDUCED CONSTIPATION: None  LIVING WILL AND CODE STATUS: ?  INTERVAL HISTORY: Christopher Roach 80 y.o. male returns for followup visit accompanied by his 2 daughters. The patient has been complaining of increasing shortness of breath and cough over the last few weeks. He also has mild left-sided chest pain. He lost around 9 pounds since his last visit secondary to lack of appetite. The patient denied having any nausea or vomiting. He denied having any fever or chills. He also complained about pain on the hip area bilaterally. He had repeat CT scan of the chest performed recently and he is here for evaluation and discussion of his scan results.  MEDICAL HISTORY: Past Medical History  Diagnosis Date  . Hypertension   . Hyperlipidemia   . Hypothyroidism   . Abnormal LFTs   . Gallbladder polyp   . BPH (benign prostatic hyperplasia)   . Actinic keratosis   . Lung nodule     benign BX 03/24/2008  . Anemia   . H/O  asbestos exposure   . Pneumonia     hx of walking PNA  . Shortness of breath     with exertion  . Constipation     from medications  . Arthritis   . Kidney stone August 2014    hx of  . Birth mark     left arm and shoulder; Dark purple on upper L chest ;NOT A RASH    ALLERGIES:  is allergic to codeine.  MEDICATIONS:  Current Outpatient Prescriptions  Medication Sig Dispense Refill  . Alum & Mag Hydroxide-Simeth (MAGIC MOUTHWASH W/LIDOCAINE) SOLN 1part nystatin,1part Maaloxplus,1part benadryl,3part 2%viscous lidocaine. Swallow 10 mL up to QID, 59mn before meals/bedtime  480 mL  1  . amLODipine (NORVASC) 10 MG tablet Take 10 mg by mouth at bedtime.       .Marland Kitchenaspirin EC 81 MG tablet Take 81 mg by mouth daily.      .Marland Kitchenlevothyroxine (SYNTHROID, LEVOTHROID) 125 MCG tablet Take 125 mcg by mouth at bedtime.       . rosuvastatin (CRESTOR) 10 MG tablet Take 10 mg by mouth daily.      . tamsulosin (FLOMAX) 0.4 MG CAPS Take 0.4 mg by mouth at bedtime.       .Marland Kitchenzolpidem (AMBIEN) 10 MG tablet Take 10 mg by mouth at bedtime.        No current facility-administered medications for this visit.    SURGICAL HISTORY:  Past Surgical History  Procedure Laterality Date  . Back surgery      Dr SCollie Siad1978 and 1982  . Cervical disc surgery    .  Right foot surgery      plantar fascitis  . Right shoulder surgery      torn rotator cuff  . Right ankle fracture         . Right wrist fracture    . Foot surgery Left   . Tonsillectomy    . Appendectomy    . Colonoscopy    . Flexible bronchoscopy N/A 12/07/2012    Procedure: FLEXIBLE BRONCHOSCOPY;  Surgeon: Gaye Pollack, MD;  Location: [facility] OR;  Service: Thoracic;  Laterality: N/A;  . Video assisted thoracoscopy (vats)/thorocotomy Left 12/07/2012    Procedure: VIDEO ASSISTED THORACOSCOPY (VATS)/ EXPLORATORY THOROCOTOMY;  Surgeon: Gaye Pollack, MD;  Location: [facility] OR;  Service: Thoracic;  Laterality: Left;    REVIEW OF SYSTEMS:  A comprehensive review of  systems was negative except for: Respiratory: positive for cough, dyspnea on exertion and pleurisy/chest pain   PHYSICAL EXAMINATION: General appearance: alert, cooperative, appears stated age and no distress Head: Normocephalic, without obvious abnormality, atraumatic Neck: no adenopathy, no JVD, supple, symmetrical, trachea midline and thyroid not enlarged, symmetric, no tenderness/mass/nodules Lymph nodes: Cervical, supraclavicular, and axillary nodes normal. Resp: diminished breath sounds LUL and dullness to percussion LUL Cardio: regular rate and rhythm, S1, S2 normal, no murmur, click, rub or gallop GI: soft, non-tender; bowel sounds normal; no masses,  no organomegaly Extremities: extremities normal, atraumatic, no cyanosis or edema Neurologic: Alert and oriented X 3, normal strength and tone. Normal symmetric reflexes. Normal coordination and gait  ECOG PERFORMANCE STATUS: 1 - Symptomatic but completely ambulatory  Blood pressure 133/62, pulse 80, temperature 98.4 F (36.9 C), temperature source Oral, resp. rate 18, height _0  (1.727 m), weight 175 lb 11.2 oz (79.697 kg), SpO2 97.00%.  LABORATORY DATA: Lab Results  Component Value Date   WBC 6.7 12/19/2013   HGB 10.8* 12/19/2013   HCT 33.2* 12/19/2013   MCV 89.6 12/19/2013   PLT 197 12/19/2013      Chemistry      Component Value Date/Time   NA 141 12/19/2013 0818   NA 132* 12/11/2012 0505   K 4.0 12/19/2013 0818   K 4.9 12/11/2012 0505   CL 97 12/11/2012 0505   CO2 24 12/19/2013 0818   CO2 28 12/11/2012 0505   BUN 14.8 12/19/2013 0818   BUN 22 12/11/2012 0505   CREATININE 1.0 12/19/2013 0818   CREATININE 1.25 12/11/2012 0505      Component Value Date/Time   CALCIUM 9.0 12/19/2013 0818   CALCIUM 8.7 12/11/2012 0505   ALKPHOS 179* 12/19/2013 0818   ALKPHOS 129* 12/05/2012 1348   AST 29 12/19/2013 0818   AST 44* 12/05/2012 1348   ALT 25 12/19/2013 0818   ALT 35 12/05/2012 1348   BILITOT 0.75 12/19/2013 0818   BILITOT 0.5 12/05/2012 1348         RADIOGRAPHIC STUDIES: Ct Chest W Contrast  12/19/2013   CLINICAL DATA:  History of lung cancer diagnosed in 2014. Chemotherapy and radiation therapy now complete.  EXAM: CT CHEST WITH CONTRAST  TECHNIQUE: Multidetector CT imaging of the chest was performed during intravenous contrast administration.  CONTRAST:  23m OMNIPAQUE IOHEXOL 300 MG/ML  SOLN  COMPARISON:  Chest CT 09/17/2013.  FINDINGS: Mediastinum: Enlarging prevascular lymph node measuring 12 mm (image 22 of series 6). Enlarging low left paratracheal lymph node measuring 12 mm (image 23 of series 6). Multiple other smaller mediastinal lymph nodes are much more prominent than the prior examination. In addition, there are multiple enlarged left hilar  lymph nodes, largest of which measures up to 16 mm in short axis (image 27 of series 6). Small hiatal hernia. Atherosclerosis of the thoracic aorta and great vessels of the mediastinum. Calcifications of the aortic valve.  Lungs/Pleura: Compared to prior examinations there is now a perihilar mass like opacity best appreciated on image 23 of series 6 where this measures approximately 5.4 x 3.3 cm. This is contiguous with abnormal soft tissue which extends all the way to the apex of the left upper lobe. How much of this soft tissue represents tumor versus postobstructive changes is uncertain on today's examination, however, I suspect that tumor extends all the way to the left apex, as image 15 of series 6 demonstrates what appears to be predominantly solid soft tissue in the left apex. There continue to be postradiation changes and architectural distortion throughout the left mid to upper lung. Multifocal areas of pleural thickening and pleural calcification are again noted in the left hemithorax, and there is a chronic area of rounded atelectasis in the periphery of the left lower lobe which is stable compared to numerous prior examinations. Peripheral peribronchovascular micronodularity is again noted  in the right lower lobe, and unchanged. A few scattered calcified right-sided pleural plaques are also noted.  Upper Abdomen: The spleen is incompletely visualized, but is clearly enlarged measuring up to 14.9 x 7.9 cm on axial images. Mildly irregular contour of the liver with atrophy of the left lobe of the liver and caudate lobe hypertrophy, indicative of underlying cirrhosis. Portal vein is mildly dilated measuring 18 mm in the porta hepatis.  Musculoskeletal: Mild compression of T8 with approximately 20% loss of anterior vertebral body height is unchanged. There are no aggressive appearing lytic or blastic lesions noted in the visualized portions of the skeleton.  IMPRESSION: 1. Findings are highly concerning for progression of disease with masslike area in the perihilar aspect of the left upper lobe, with probable extension of malignant tissue throughout the apical portions of the left upper lobe as well (versus postobstructive changes). There is also progressively worsening left hilar and mediastinal adenopathy, as detailed above. 2. 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 Stable round atelectasis in the left lower lobe. 3. Stigmata of cirrhosis with portal hypertension, including splenomegaly again noted. 4. Additional incidental findings, as above. These results will be called to the ordering clinician or representative by the Radiologist Assistant, and communication documented in the PACS or zVision Dashboard.   Electronically Signed   By: Vinnie Langton M.D.   On: 12/19/2013 12:02   ASSESSMENT/PLAN: This is a very pleasant 80 years old white male recently diagnosed with a stage IIIa non-small cell lung cancer currently undergoing concurrent chemoradiation with weekly carboplatin and paclitaxel status post 7 cycles.  His most recent CT scan of the chest showed findings concerning for progression of his disease in the left upper lobe. I discussed the scan results and showed  the images to the patient and his family. I recommended for him to have a PET scan performed for further evaluation of his disease and to rule out metastatic disease to the bone especially in the hip areas. He would come back for followup visit in 2 weeks for evaluation and discussion of his PET scan and treatment options. He was advised to call immediately if he has any concerning symptoms in the interval. The patient was advised to call immediately if he has any concerning symptoms in the interval. All questions were answered. The patient knows to  call the clinic with any problems, questions or concerns. We can certainly see the patient much sooner if necessary.  Disclaimer: This note was dictated with voice recognition software. Similar sounding words can inadvertently be transcribed and may not be corrected upon review.   Eilleen Kempf., MD 12/23/2013

## 2013-12-24 ENCOUNTER — Telehealth: Payer: Self-pay | Admitting: *Deleted

## 2013-12-24 NOTE — Telephone Encounter (Signed)
Pt called stating he forgot to ask for a copy of his CT scan when he saw Dr Vista Mink on 9/14.  CT scan mailed to patient per pt request.  SLJ

## 2014-01-01 ENCOUNTER — Ambulatory Visit (HOSPITAL_COMMUNITY)
Admission: RE | Admit: 2014-01-01 | Discharge: 2014-01-01 | Disposition: A | Discharge status: Home / Self Care | Payer: Medicare Other | Source: Ambulatory Visit | Attending: Internal Medicine | Admitting: Internal Medicine

## 2014-01-01 ENCOUNTER — Encounter (HOSPITAL_COMMUNITY): Payer: Self-pay

## 2014-01-01 DIAGNOSIS — C3490 Malignant neoplasm of unspecified part of unspecified bronchus or lung: Secondary | ICD-10-CM | POA: Diagnosis not present

## 2014-01-01 DIAGNOSIS — C3492 Malignant neoplasm of unspecified part of left bronchus or lung: Secondary | ICD-10-CM

## 2014-01-01 LAB — GLUCOSE, CAPILLARY: Glucose-Capillary: 107 mg/dL — ABNORMAL HIGH (ref 70–99)

## 2014-01-01 MED ORDER — FLUDEOXYGLUCOSE F - 18 (FDG) INJECTION: 8.7100 | Freq: Once | INTRAVENOUS | Status: AC | PRN

## 2014-01-02 ENCOUNTER — Ambulatory Visit (HOSPITAL_BASED_OUTPATIENT_CLINIC_OR_DEPARTMENT_OTHER): Payer: Medicare Other

## 2014-01-02 ENCOUNTER — Ambulatory Visit (HOSPITAL_BASED_OUTPATIENT_CLINIC_OR_DEPARTMENT_OTHER): Payer: Medicare Other | Admitting: Internal Medicine

## 2014-01-02 ENCOUNTER — Telehealth: Payer: Self-pay | Admitting: *Deleted

## 2014-01-02 ENCOUNTER — Telehealth: Payer: Self-pay | Admitting: Internal Medicine

## 2014-01-02 ENCOUNTER — Encounter: Payer: Self-pay | Admitting: Internal Medicine

## 2014-01-02 VITALS — BP 132/62 | HR 82 | Temp 97.6°F | Resp 18 | Ht 68.0 in | Wt 174.6 lb

## 2014-01-02 DIAGNOSIS — C3410 Malignant neoplasm of upper lobe, unspecified bronchus or lung: Secondary | ICD-10-CM

## 2014-01-02 DIAGNOSIS — C3492 Malignant neoplasm of unspecified part of left bronchus or lung: Secondary | ICD-10-CM

## 2014-01-02 DIAGNOSIS — Z23 Encounter for immunization: Secondary | ICD-10-CM

## 2014-01-02 MED ORDER — INFLUENZA VAC SPLIT QUAD 0.5 ML IM SUSY
0.5000 mL | PREFILLED_SYRINGE | Freq: Once | INTRAMUSCULAR | Status: AC
  Administered 2014-01-02: 0.5 mL via INTRAMUSCULAR
  Filled 2014-01-02: qty 0.5

## 2014-01-02 MED ORDER — CYANOCOBALAMIN 1000 MCG/ML IJ SOLN
1000.0000 ug | Freq: Once | INTRAMUSCULAR | Status: AC
  Administered 2014-01-02: 1000 ug via INTRAMUSCULAR

## 2014-01-02 MED ORDER — OXYCODONE-ACETAMINOPHEN 5-325 MG PO TABS: 1.0000 | ORAL_TABLET | Freq: Four times a day (QID) | ORAL | Status: DC | PRN

## 2014-01-02 MED ORDER — HYDROCODONE-HOMATROPINE 5-1.5 MG/5ML PO SYRP: 5.0000 mL | ORAL_SOLUTION | Freq: Four times a day (QID) | ORAL | Status: DC | PRN

## 2014-01-02 NOTE — Telephone Encounter (Signed)
Pt confirmed labs/ov per 09/24 POF, sent msg to add chemo, gave pt AVS......KJ

## 2014-01-02 NOTE — Telephone Encounter (Signed)
mskae

## 2014-01-02 NOTE — Telephone Encounter (Signed)
Per staff message and POF I have scheduled appts. Advised scheduler of appts. JMW  

## 2014-01-02 NOTE — Progress Notes (Signed)
[place]  [phone] [phone]  OFFICE VISIT PROGRESS NOTE  [name], [place] [address]  DIAGNOSIS: Recurrent non-small cell lung cancer initially diagnosed as Stage IIIA (T1b, N2, M0) non-small cell lung cancer, adenocarcinoma with negative EGFR mutation and negative ALK gene translocation  PRIOR THERAPY: Concurrent chemoradiation with weekly carboplatin for an AUC of 2 and paclitaxel at 45 mg per meter squared concurrent with radiation therapy under the care of Dr. Lisbeth Renshaw. He status post 7 cycles of chemotherapy. Last dose was given 02/11/2013 with partial response.  CURRENT THERAPY: Systemic chemotherapy with carboplatin for AUC of 5 and Alimta 500 mg/M2 every 3 weeks. First dose on 01/07/2014  DISEASE STAGE: Stage IIIA (T1b, N2, M0)  CHEMOTHERAPY INTENT: Palliative.  CURRENT # OF CHEMOTHERAPY CYCLES: 1  CURRENT ANTIEMETICS: Zofran, dexamethasone, and Compazine for at home use  CURRENT SMOKING STATUS: Former smoker quit 12/20/1992  ORAL CHEMOTHERAPY AND CONSENT: n/a  CURRENT BISPHOSPHONATES USE: None  PAIN MANAGEMENT: Dilaudid  NARCOTICS INDUCED CONSTIPATION: None  LIVING WILL AND CODE STATUS: ?  INTERVAL HISTORY: Christopher Roach 80 y.o. male returns for followup visit accompanied by his 3 daughters. The patient continues to complain of pain on the left side of the chest as well as lack of appetite and weight loss. He also has shortness breath with exertion. The patient denied having any nausea or vomiting. He denied having any fever or chills. He was found recently to have evidence for disease progression on the left lung with progressive mediastinal lymphadenopathy. I ordered a PET scan and the patient is here today for evaluation and discussion of his PET scan results and treatment options.  MEDICAL HISTORY: Past Medical History  Diagnosis Date  . Hypertension   . Hyperlipidemia   .  Hypothyroidism   . Abnormal LFTs   . Gallbladder polyp   . BPH (benign prostatic hyperplasia)   . Actinic keratosis   . Lung nodule     benign BX 03/24/2008  . Anemia   . H/O asbestos exposure   . Pneumonia     hx of walking PNA  . Shortness of breath     with exertion  . Constipation     from medications  . Arthritis   . Kidney stone August 2014    hx of  . Birth mark     left arm and shoulder; Dark purple on upper L chest ;NOT A RASH    ALLERGIES:  is allergic to codeine.  MEDICATIONS:  Current Outpatient Prescriptions  Medication Sig Dispense Refill  . Alum & Mag Hydroxide-Simeth (MAGIC MOUTHWASH W/LIDOCAINE) SOLN 1part nystatin,1part Maaloxplus,1part benadryl,3part 2%viscous lidocaine. Swallow 10 mL up to QID, 74min before meals/bedtime  480 mL  1  . amLODipine (NORVASC) 10 MG tablet Take 10 mg by mouth at bedtime.       Marland Kitchen aspirin EC 81 MG tablet Take 81 mg by mouth daily.      Marland Kitchen levothyroxine (SYNTHROID, LEVOTHROID) 125 MCG tablet Take 125 mcg by mouth at bedtime.       . rosuvastatin (CRESTOR) 10 MG tablet Take 10 mg by mouth daily.      . tamsulosin (FLOMAX) 0.4 MG CAPS Take 0.4 mg by mouth at bedtime.       Marland Kitchen zolpidem (AMBIEN) 10 MG tablet Take 10 mg by mouth at bedtime.        No current facility-administered medications for this visit.    SURGICAL HISTORY:  Past Surgical History  Procedure Laterality Date  . Back surgery      Dr Collie Siad 1978 and 1982  . Cervical disc surgery    . Right foot surgery      plantar fascitis  . Right shoulder surgery      torn rotator cuff  . Right ankle fracture         . Right wrist fracture    . Foot surgery Left   . Tonsillectomy    . Appendectomy    . Colonoscopy    . Flexible bronchoscopy N/A 12/07/2012    Procedure: FLEXIBLE BRONCHOSCOPY;  Surgeon: Gaye Pollack, MD;  Location: [facility] OR;  Service: Thoracic;  Laterality: N/A;  . Video assisted thoracoscopy (vats)/thorocotomy Left 12/07/2012    Procedure: VIDEO ASSISTED  THORACOSCOPY (VATS)/ EXPLORATORY THOROCOTOMY;  Surgeon: Gaye Pollack, MD;  Location: [facility] OR;  Service: Thoracic;  Laterality: Left;    REVIEW OF SYSTEMS:  Constitutional: positive for fatigue Eyes: negative Ears, nose, mouth, throat, and face: negative Respiratory: positive for cough, dyspnea on exertion and pleurisy/chest pain Cardiovascular: negative Gastrointestinal: negative Genitourinary:negative Integument/breast: negative Hematologic/lymphatic: negative Musculoskeletal:negative Neurological: negative Behavioral/Psych: negative Endocrine: negative Allergic/Immunologic: negative   PHYSICAL EXAMINATION: General appearance: alert, cooperative, appears stated age and no distress Head: Normocephalic, without obvious abnormality, atraumatic Neck: no adenopathy, no JVD, supple, symmetrical, trachea midline and thyroid not enlarged, symmetric, no tenderness/mass/nodules Lymph nodes: Cervical, supraclavicular, and axillary nodes normal. Resp: diminished breath sounds LUL and dullness to percussion LUL Cardio: regular rate and rhythm, S1, S2 normal, no murmur, click, rub or gallop GI: soft, non-tender; bowel sounds normal; no masses,  no organomegaly Extremities: extremities normal, atraumatic, no cyanosis or edema Neurologic: Alert and oriented X 3, normal strength and tone. Normal symmetric reflexes. Normal coordination and gait  ECOG PERFORMANCE STATUS: 1 - Symptomatic but completely ambulatory  Blood pressure 132/62, pulse 82, temperature 97.6 F (36.4 C), temperature source Oral, resp. rate 18, height $RemoveBe'5\' 8"'gwWlGmxKg$  (1.727 m), weight 174 lb 9.6 oz (79.198 kg).  LABORATORY DATA: Lab Results  Component Value Date   WBC 6.7 12/19/2013   HGB 10.8* 12/19/2013   HCT 33.2* 12/19/2013   MCV 89.6 12/19/2013   PLT 197 12/19/2013      Chemistry      Component Value Date/Time   NA 141 12/19/2013 0818   NA 132* 12/11/2012 0505   K 4.0 12/19/2013 0818   K 4.9 12/11/2012 0505   CL 97 12/11/2012 0505     CO2 24 12/19/2013 0818   CO2 28 12/11/2012 0505   BUN 14.8 12/19/2013 0818   BUN 22 12/11/2012 0505   CREATININE 1.0 12/19/2013 0818   CREATININE 1.25 12/11/2012 0505      Component Value Date/Time   CALCIUM 9.0 12/19/2013 0818   CALCIUM 8.7 12/11/2012 0505   ALKPHOS 179* 12/19/2013 0818   ALKPHOS 129* 12/05/2012 1348   AST 29 12/19/2013 0818   AST 44* 12/05/2012 1348   ALT 25 12/19/2013 0818   ALT 35 12/05/2012 1348   BILITOT 0.75 12/19/2013 0818   BILITOT 0.5 12/05/2012 1348       RADIOGRAPHIC STUDIES: Ct Chest W Contrast  12/19/2013   CLINICAL DATA:  History of lung cancer diagnosed in 2014. Chemotherapy and radiation therapy now complete.  EXAM: CT CHEST WITH CONTRAST  TECHNIQUE: Multidetector CT imaging of the chest was performed during intravenous contrast administration.  CONTRAST:  25mL OMNIPAQUE IOHEXOL 300 MG/ML  SOLN  COMPARISON:  Chest CT 09/17/2013.  FINDINGS: Mediastinum: Enlarging prevascular lymph node measuring 12 mm (image 22 of series 6). Enlarging low left paratracheal lymph node measuring 12 mm (image 23 of series 6). Multiple other smaller mediastinal lymph nodes are much more prominent than the prior examination. In addition, there are multiple enlarged left hilar lymph nodes, largest of which measures up to 16 mm in short axis (image 27 of series 6). Small hiatal hernia. Atherosclerosis of the thoracic aorta and great vessels of the mediastinum. Calcifications of the aortic valve.  Lungs/Pleura: Compared to prior examinations there is now a perihilar mass like opacity best appreciated on image 23 of series 6 where this measures approximately 5.4 x 3.3 cm. This is contiguous with abnormal soft tissue which extends all the way to the apex of the left upper lobe. How much of this soft tissue represents tumor versus postobstructive changes is uncertain on today's examination, however, I suspect that tumor extends all the way to the left apex, as image 15 of series 6 demonstrates what  appears to be predominantly solid soft tissue in the left apex. There continue to be postradiation changes and architectural distortion throughout the left mid to upper lung. Multifocal areas of pleural thickening and pleural calcification are again noted in the left hemithorax, and there is a chronic area of rounded atelectasis in the periphery of the left lower lobe which is stable compared to numerous prior examinations. Peripheral peribronchovascular micronodularity is again noted in the right lower lobe, and unchanged. A few scattered calcified right-sided pleural plaques are also noted.  Upper Abdomen: The spleen is incompletely visualized, but is clearly enlarged measuring up to 14.9 x 7.9 cm on axial images. Mildly irregular contour of the liver with atrophy of the left lobe of the liver and caudate lobe hypertrophy, indicative of underlying cirrhosis. Portal vein is mildly dilated measuring 18 mm in the porta hepatis.  Musculoskeletal: Mild compression of T8 with approximately 20% loss of anterior vertebral body height is unchanged. There are no aggressive appearing lytic or blastic lesions noted in the visualized portions of the skeleton.  IMPRESSION: 1. Findings are highly concerning for progression of disease with masslike area in the perihilar aspect of the left upper lobe, with probable extension of malignant tissue throughout the apical portions of the left upper lobe as well (versus postobstructive changes). There is also progressively worsening left hilar and mediastinal adenopathy, as detailed above. 2. 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 Stable round atelectasis in the left lower lobe. 3. Stigmata of cirrhosis with portal hypertension, including splenomegaly again noted. 4. Additional incidental findings, as above. These results will be called to the ordering clinician or representative by the Radiologist Assistant, and communication documented in the PACS or  zVision Dashboard.   Electronically Signed   By: Vinnie Langton M.D.   On: 12/19/2013 12:02   Nm Pet Image Restag (ps) Skull Base To Thigh  01/01/2014   CLINICAL DATA:  Subsequent treatment strategy for non-small-cell left-sided lung cancer.  EXAM: NUCLEAR MEDICINE PET SKULL BASE TO THIGH  TECHNIQUE: 8.7 mCi F-18 FDG was injected intravenously. Full-ring PET imaging was performed from the skull base to thigh after the radiotracer. CT data was obtained and used for attenuation correction and anatomic localization.  FASTING BLOOD GLUCOSE:  Value: 107 mg/dl  COMPARISON:  11/06/2012 PET. multiple chest CTs, most recent 12/19/2013.  FINDINGS: NECK  New hypermetabolism within a left supraclavicular/ low jugular node. This measures 1.4 cm and a S.U.V. max of 15.6 on  image 57.  CHEST  Multi focal progressive/recurrent disease throughout the left upper lobe. The most confluent area corresponds to an area of dense masslike consolidation at the left apex. This measures a S.U.V. max of 17.7, including on image 17. Extension of tumor into the superior segment left lower lobe.  Multiple middle and anterior mediastinal nodal metastasis. Index subcarinal node measures 1.3 cm and a S.U.V. max of 17.4.  Index prevascular node measures 1.1 cm and a S.U.V. max of 9.0 on image 75.  ABDOMEN/PELVIS  No areas of abnormal hypermetabolism.  SKELETON  No abnormal marrow activity. There is decreased marrow activity within the thoracic spine, likely radiation induced.  CT IMAGES PERFORMED FOR ATTENUATION CORRECTION  Cerebral atrophy. Dense carotid atherosclerosis. Chest findings deferred to recent diagnostic CTs. Bilateral pleural calcifications, consistent with asbestos related pleural disease. Rounded atelectasis in the left lower lobe. Small hiatal hernia. Mild left adrenal nodularity which is low density and likely due to an adenoma. Right renal cysts. Advanced aortic and branch vessel atherosclerosis, with calcified wall thrombus.   Advanced cirrhosis. Marked prostatomegaly. Small bilateral fat containing inguinal hernias.  IMPRESSION: 1. Progressive/recurrent disease, including throughout the left lung, thoracic, and cervical nodal stations. 2. No evidence of subdiaphragmatic disease. 3. Asbestos related pleural disease. 4. Cirrhosis.   Electronically Signed   By: Abigail Miyamoto M.D.   On: 01/01/2014 13:22   ASSESSMENT/PLAN: This is a very pleasant 80 years old white male recently diagnosed with a stage IIIa non-small cell lung cancer currently undergoing concurrent chemoradiation with weekly carboplatin and paclitaxel status post 7 cycles.  The recent CT scan of the chest as well as PET scan showed evidence for disease progression involving the left lung as well as the thoracic and cervical nodal stations but no evidence for subdiaphragmatic disease. I had a lengthy discussion with the patient and his family today about his current disease status and treatment options. I gave the patient the option of palliative care and hospice referral versus consideration of systemic chemotherapy with carboplatin for AUC of 5 and Alimta 500 mg/M2 every 3 weeks. I discussed with the patient adverse effect of the chemotherapy including but not limited to alopecia, myelosuppression, nausea and vomiting, peripheral neuropathy, liver or renal dysfunction. He would like to proceed with chemotherapy and he is expected to start the first cycle of this treatment on 01/07/2014. He'll receive vitamin B12 injection today. I would also call his pharmacy with prescription for folic acid 1 mg by mouth daily, Decadron 4 mg by mouth twice a day the day before, day of and day after the chemotherapy in addition to Compazine 10 mg by mouth every 6 hours as needed for nausea. For the cough, I gave the patient prescription for Hycodan 5 ML by mouth every 6 hours as needed. For pain management I gave the patient prescription for Percocet 5/325 mg by mouth every 6 hours  as needed. The patient would come back for followup visit in 2 weeks for evaluation and management any adverse effect of his treatment. He was advised to call immediately if he has any concerning symptoms in the interval. The patient was advised to call immediately if he has any concerning symptoms in the interval. All questions were answered. The patient knows to call the clinic with any problems, questions or concerns. We can certainly see the patient much sooner if necessary.  Disclaimer: This note was dictated with voice recognition software. Similar sounding words can inadvertently be transcribed and may not be  corrected upon review.   Eilleen Kempf., MD 01/02/2014

## 2014-01-04 MED ORDER — DEXAMETHASONE 4 MG PO TABS: ORAL_TABLET | ORAL | Status: DC

## 2014-01-04 MED ORDER — PROCHLORPERAZINE MALEATE 10 MG PO TABS: 10.0000 mg | ORAL_TABLET | Freq: Four times a day (QID) | ORAL | Status: DC | PRN

## 2014-01-04 MED ORDER — FOLIC ACID 1 MG PO TABS: 1.0000 mg | ORAL_TABLET | Freq: Every day | ORAL | Status: DC

## 2014-01-06 ENCOUNTER — Telehealth: Payer: Self-pay | Admitting: Dietician

## 2014-01-06 NOTE — Telephone Encounter (Signed)
Brief Outpatient Oncology Nutrition Note  Patient has been identified to be at risk on malnutrition screen.  Wt Readings from Last 10 Encounters:  01/02/14 174 lb 9.6 oz (79.198 kg)  12/23/13 175 lb 11.2 oz (79.697 kg)  09/19/13 189 lb 6.4 oz (85.911 kg)  06/18/13 190 lb (86.183 kg)  03/20/13 186 lb 8 oz (84.596 kg)  03/14/13 185 lb 14.4 oz (84.324 kg)  02/13/13 187 lb 9.6 oz (85.095 kg)  02/11/13 188 lb 9.6 oz (85.548 kg)  01/31/13 185 lb 4.8 oz (84.052 kg)  01/28/13 186 lb (84.369 kg)     Dx:  Recurrent non-small cell lung cancer intimally diagnosed as stage IIIA.  Patient of Dr. Julien Nordmann.  Currently receiving palliative chemo.  Called patient due to weight loss.  Poor appetite and intake per chart on last visit.  Patient states that daughter and son-in-law have moved in with him.  Daughter is preparing meals and patient has been eating well and drinking Boost.  Will mail patient coupons for Boost along with tips for increasing calories and protein.  Antonieta Iba, RD, LDN

## 2014-01-07 ENCOUNTER — Ambulatory Visit (HOSPITAL_BASED_OUTPATIENT_CLINIC_OR_DEPARTMENT_OTHER): Payer: Medicare Other

## 2014-01-07 ENCOUNTER — Other Ambulatory Visit (HOSPITAL_BASED_OUTPATIENT_CLINIC_OR_DEPARTMENT_OTHER): Payer: Medicare Other

## 2014-01-07 VITALS — BP 115/48 | HR 81 | Temp 98.7°F

## 2014-01-07 DIAGNOSIS — C3410 Malignant neoplasm of upper lobe, unspecified bronchus or lung: Secondary | ICD-10-CM

## 2014-01-07 DIAGNOSIS — Z5111 Encounter for antineoplastic chemotherapy: Secondary | ICD-10-CM

## 2014-01-07 DIAGNOSIS — C3412 Malignant neoplasm of upper lobe, left bronchus or lung: Secondary | ICD-10-CM

## 2014-01-07 DIAGNOSIS — C3492 Malignant neoplasm of unspecified part of left bronchus or lung: Secondary | ICD-10-CM

## 2014-01-07 LAB — COMPREHENSIVE METABOLIC PANEL (CC13)
ALT: 30 U/L (ref 0–55)
AST: 36 U/L — AB (ref 5–34)
Albumin: 2.8 g/dL — ABNORMAL LOW (ref 3.5–5.0)
Alkaline Phosphatase: 225 U/L — ABNORMAL HIGH (ref 40–150)
Anion Gap: 7 mEq/L (ref 3–11)
BILIRUBIN TOTAL: 0.74 mg/dL (ref 0.20–1.20)
BUN: 12.7 mg/dL (ref 7.0–26.0)
CO2: 27 mEq/L (ref 22–29)
CREATININE: 1 mg/dL (ref 0.7–1.3)
Calcium: 9.2 mg/dL (ref 8.4–10.4)
Chloride: 105 mEq/L (ref 98–109)
Glucose: 164 mg/dl — ABNORMAL HIGH (ref 70–140)
Potassium: 4.4 mEq/L (ref 3.5–5.1)
Sodium: 139 mEq/L (ref 136–145)
Total Protein: 7.4 g/dL (ref 6.4–8.3)

## 2014-01-07 LAB — CBC WITH DIFFERENTIAL/PLATELET
BASO%: 0.9 % (ref 0.0–2.0)
Basophils Absolute: 0.1 10*3/uL (ref 0.0–0.1)
EOS%: 1.1 % (ref 0.0–7.0)
Eosinophils Absolute: 0.1 10*3/uL (ref 0.0–0.5)
HCT: 32.4 % — ABNORMAL LOW (ref 38.4–49.9)
HGB: 10.3 g/dL — ABNORMAL LOW (ref 13.0–17.1)
LYMPH%: 7.6 % — AB (ref 14.0–49.0)
MCH: 28.4 pg (ref 27.2–33.4)
MCHC: 31.7 g/dL — AB (ref 32.0–36.0)
MCV: 89.7 fL (ref 79.3–98.0)
MONO#: 0.6 10*3/uL (ref 0.1–0.9)
MONO%: 7.8 % (ref 0.0–14.0)
NEUT#: 5.9 10*3/uL (ref 1.5–6.5)
NEUT%: 82.6 % — ABNORMAL HIGH (ref 39.0–75.0)
PLATELETS: 188 10*3/uL (ref 140–400)
RBC: 3.61 10*6/uL — AB (ref 4.20–5.82)
RDW: 14.4 % (ref 11.0–14.6)
WBC: 7.1 10*3/uL (ref 4.0–10.3)
lymph#: 0.5 10*3/uL — ABNORMAL LOW (ref 0.9–3.3)

## 2014-01-07 MED ORDER — SODIUM CHLORIDE 0.9 % IV SOLN
Freq: Once | INTRAVENOUS | Status: AC
  Administered 2014-01-07: 11:00:00 via INTRAVENOUS

## 2014-01-07 MED ORDER — DEXAMETHASONE SODIUM PHOSPHATE 20 MG/5ML IJ SOLN
INTRAMUSCULAR | Status: AC
  Filled 2014-01-07: qty 5

## 2014-01-07 MED ORDER — PEMETREXED DISODIUM CHEMO INJECTION 500 MG
500.0000 mg/m2 | Freq: Once | INTRAVENOUS | Status: AC
  Administered 2014-01-07: 975 mg via INTRAVENOUS
  Filled 2014-01-07: qty 39

## 2014-01-07 MED ORDER — SODIUM CHLORIDE 0.9 % IV SOLN
449.5000 mg | Freq: Once | INTRAVENOUS | Status: AC
  Administered 2014-01-07: 450 mg via INTRAVENOUS
  Filled 2014-01-07: qty 45

## 2014-01-07 MED ORDER — DEXAMETHASONE SODIUM PHOSPHATE 20 MG/5ML IJ SOLN
20.0000 mg | Freq: Once | INTRAMUSCULAR | Status: AC
  Administered 2014-01-07: 20 mg via INTRAVENOUS

## 2014-01-07 MED ORDER — ONDANSETRON 16 MG/50ML IVPB (CHCC)
16.0000 mg | Freq: Once | INTRAVENOUS | Status: AC
  Administered 2014-01-07: 16 mg via INTRAVENOUS

## 2014-01-07 MED ORDER — ONDANSETRON 16 MG/50ML IVPB (CHCC)
INTRAVENOUS | Status: AC
  Filled 2014-01-07: qty 16

## 2014-01-07 NOTE — Patient Instructions (Signed)
Christopher Roach Discharge Instructions for Patients Receiving Chemotherapy  Today you received the following chemotherapy agents:  Alimta and carboplatin  To help prevent nausea and vomiting after your treatment, we encourage you to take your nausea medication:  Compazine 10 mg every 6 hours as needed.   If you develop nausea and vomiting that is not controlled by your nausea medication, call the clinic.   BELOW ARE SYMPTOMS THAT SHOULD BE REPORTED IMMEDIATELY:  *FEVER GREATER THAN 100.5 F  *CHILLS WITH OR WITHOUT FEVER  NAUSEA AND VOMITING THAT IS NOT CONTROLLED WITH YOUR NAUSEA MEDICATION  *UNUSUAL SHORTNESS OF BREATH  *UNUSUAL BRUISING OR BLEEDING  TENDERNESS IN MOUTH AND THROAT WITH OR WITHOUT PRESENCE OF ULCERS  *URINARY PROBLEMS  *BOWEL PROBLEMS  UNUSUAL RASH Items with * indicate a potential emergency and should be followed up as soon as possible.  Feel free to call the clinic you have any questions or concerns. The clinic phone number is [phone].

## 2014-01-09 ENCOUNTER — Ambulatory Visit (HOSPITAL_COMMUNITY)
Admission: RE | Admit: 2014-01-09 | Discharge: 2014-01-09 | Disposition: A | Discharge status: Home / Self Care | Payer: Medicare Other | Source: Ambulatory Visit | Attending: Internal Medicine | Admitting: Internal Medicine

## 2014-01-09 DIAGNOSIS — C3492 Malignant neoplasm of unspecified part of left bronchus or lung: Secondary | ICD-10-CM | POA: Diagnosis present

## 2014-01-09 MED ORDER — GADOBENATE DIMEGLUMINE 529 MG/ML IV SOLN
15.0000 mL | Freq: Once | INTRAVENOUS | Status: AC | PRN
  Administered 2014-01-09: 15 mL via INTRAVENOUS

## 2014-01-10 ENCOUNTER — Telehealth: Payer: Self-pay | Admitting: *Deleted

## 2014-01-10 ENCOUNTER — Telehealth: Payer: Self-pay | Admitting: Medical Oncology

## 2014-01-10 ENCOUNTER — Emergency Department (HOSPITAL_COMMUNITY): Payer: Medicare Other

## 2014-01-10 ENCOUNTER — Emergency Department (HOSPITAL_COMMUNITY)
Admission: EM | Admit: 2014-01-10 | Discharge: 2014-01-10 | Disposition: A | Discharge status: Home / Self Care | Payer: Medicare Other | Attending: Emergency Medicine | Admitting: Emergency Medicine

## 2014-01-10 ENCOUNTER — Encounter (HOSPITAL_COMMUNITY): Payer: Self-pay | Admitting: Emergency Medicine

## 2014-01-10 ENCOUNTER — Other Ambulatory Visit: Payer: Self-pay | Admitting: Medical Oncology

## 2014-01-10 DIAGNOSIS — Z87442 Personal history of urinary calculi: Secondary | ICD-10-CM | POA: Insufficient documentation

## 2014-01-10 DIAGNOSIS — R509 Fever, unspecified: Secondary | ICD-10-CM | POA: Insufficient documentation

## 2014-01-10 DIAGNOSIS — Z9889 Other specified postprocedural states: Secondary | ICD-10-CM | POA: Insufficient documentation

## 2014-01-10 DIAGNOSIS — D649 Anemia, unspecified: Secondary | ICD-10-CM | POA: Diagnosis not present

## 2014-01-10 DIAGNOSIS — I10 Essential (primary) hypertension: Secondary | ICD-10-CM | POA: Diagnosis not present

## 2014-01-10 DIAGNOSIS — R300 Dysuria: Secondary | ICD-10-CM | POA: Insufficient documentation

## 2014-01-10 DIAGNOSIS — R339 Retention of urine, unspecified: Secondary | ICD-10-CM | POA: Diagnosis present

## 2014-01-10 DIAGNOSIS — Z9089 Acquired absence of other organs: Secondary | ICD-10-CM | POA: Insufficient documentation

## 2014-01-10 DIAGNOSIS — Z8701 Personal history of pneumonia (recurrent): Secondary | ICD-10-CM | POA: Diagnosis not present

## 2014-01-10 DIAGNOSIS — E785 Hyperlipidemia, unspecified: Secondary | ICD-10-CM | POA: Insufficient documentation

## 2014-01-10 DIAGNOSIS — Z79899 Other long term (current) drug therapy: Secondary | ICD-10-CM | POA: Diagnosis not present

## 2014-01-10 DIAGNOSIS — N400 Enlarged prostate without lower urinary tract symptoms: Secondary | ICD-10-CM | POA: Insufficient documentation

## 2014-01-10 DIAGNOSIS — M549 Dorsalgia, unspecified: Secondary | ICD-10-CM | POA: Diagnosis not present

## 2014-01-10 DIAGNOSIS — M1990 Unspecified osteoarthritis, unspecified site: Secondary | ICD-10-CM | POA: Diagnosis not present

## 2014-01-10 DIAGNOSIS — R066 Hiccough: Secondary | ICD-10-CM

## 2014-01-10 DIAGNOSIS — R34 Anuria and oliguria: Secondary | ICD-10-CM | POA: Insufficient documentation

## 2014-01-10 DIAGNOSIS — Z87891 Personal history of nicotine dependence: Secondary | ICD-10-CM | POA: Diagnosis not present

## 2014-01-10 DIAGNOSIS — Z8719 Personal history of other diseases of the digestive system: Secondary | ICD-10-CM | POA: Insufficient documentation

## 2014-01-10 DIAGNOSIS — Z7982 Long term (current) use of aspirin: Secondary | ICD-10-CM | POA: Diagnosis not present

## 2014-01-10 LAB — CBC WITH DIFFERENTIAL/PLATELET
BASOS ABS: 0 10*3/uL (ref 0.0–0.1)
Basophils Relative: 0 % (ref 0–1)
EOS PCT: 0 % (ref 0–5)
Eosinophils Absolute: 0 10*3/uL (ref 0.0–0.7)
HCT: 31.7 % — ABNORMAL LOW (ref 39.0–52.0)
Hemoglobin: 10.6 g/dL — ABNORMAL LOW (ref 13.0–17.0)
LYMPHS PCT: 7 % — AB (ref 12–46)
Lymphs Abs: 0.5 10*3/uL — ABNORMAL LOW (ref 0.7–4.0)
MCH: 29.3 pg (ref 26.0–34.0)
MCHC: 33.4 g/dL (ref 30.0–36.0)
MCV: 87.6 fL (ref 78.0–100.0)
MONO ABS: 0 10*3/uL — AB (ref 0.1–1.0)
Monocytes Relative: 1 % — ABNORMAL LOW (ref 3–12)
Neutro Abs: 6.5 10*3/uL (ref 1.7–7.7)
Neutrophils Relative %: 92 % — ABNORMAL HIGH (ref 43–77)
PLATELETS: 203 10*3/uL (ref 150–400)
RBC: 3.62 MIL/uL — ABNORMAL LOW (ref 4.22–5.81)
RDW: 14.3 % (ref 11.5–15.5)
WBC: 7 10*3/uL (ref 4.0–10.5)

## 2014-01-10 LAB — URINALYSIS, ROUTINE W REFLEX MICROSCOPIC
BILIRUBIN URINE: NEGATIVE
GLUCOSE, UA: NEGATIVE mg/dL
Ketones, ur: NEGATIVE mg/dL
Leukocytes, UA: NEGATIVE
Nitrite: NEGATIVE
PH: 7 (ref 5.0–8.0)
PROTEIN: NEGATIVE mg/dL
Specific Gravity, Urine: 1.016 (ref 1.005–1.030)
Urobilinogen, UA: 1 mg/dL (ref 0.0–1.0)

## 2014-01-10 LAB — BASIC METABOLIC PANEL
Anion gap: 13 (ref 5–15)
BUN: 20 mg/dL (ref 6–23)
CO2: 24 meq/L (ref 19–32)
CREATININE: 0.9 mg/dL (ref 0.50–1.35)
Calcium: 8.7 mg/dL (ref 8.4–10.5)
Chloride: 98 mEq/L (ref 96–112)
GFR calc Af Amer: >90 mL/min (ref >90)
GFR calc non Af Amer: 78 mL/min — ABNORMAL LOW (ref >90)
GLUCOSE: 111 mg/dL — AB (ref 70–99)
Potassium: 4.3 mEq/L (ref 3.7–5.3)
Sodium: 135 mEq/L — ABNORMAL LOW (ref 137–147)

## 2014-01-10 LAB — URINE MICROSCOPIC-ADD ON

## 2014-01-10 MED ORDER — CHLORPROMAZINE HCL 25 MG PO TABS: 25.0000 mg | ORAL_TABLET | Freq: Three times a day (TID) | ORAL | Status: DC | PRN

## 2014-01-10 NOTE — Telephone Encounter (Signed)
Recent CBG 115 per Maudie Mercury in lab and she told him he was okay to go home.

## 2014-01-10 NOTE — ED Notes (Signed)
Bladder scanner read >  600 mLs.

## 2014-01-10 NOTE — ED Notes (Addendum)
Pt c/o urinary retention and bilateral flank pain starting this morning.  Pain score 7/10.  Sts dysuria, if he can pass any urine.  Pt reports he had a MRI yesterday w/ contrast and he thinks, the dye has caused this issue.  Hx of lung CA and BPH.

## 2014-01-10 NOTE — Telephone Encounter (Signed)
Christopher Roach called and reported "an emergency" Pt cannot void and has penile burning and back pain. Last void this am. I instructed family to take pt to ED at [facility] and notified ED charge nurse that pt coming.

## 2014-01-10 NOTE — Telephone Encounter (Signed)
I i instructed daughter that pt should not take Conpazine if he is taking thorazine, that the thorazine will help with nausea and hiccoughs.She voices understanding.

## 2014-01-10 NOTE — ED Provider Notes (Signed)
CSN: [id number]     Arrival date & time 01/10/14  1402 History   First MD Initiated Contact with Patient 01/10/14 1406     Chief Complaint  Patient presents with  . Urinary Retention  . Flank Pain     (Consider location/radiation/quality/duration/timing/severity/associated sxs/prior Treatment) HPI 80 year old male presents with acute urinary retention. He urinated once this morning around 8 AM (6 hours ago) and has not been able to since. He states it burns when he tries to pee and only small drops come out. He received IV contrast for an MRI yesterday and thinks this caused all of his problems. He was able to urinate last night normally. Has been having low-grade fevers at home without any obvious source. He has been having a nonproductive cough. No nausea or vomiting. He has been feeling low back pain today as well. He has a history of an enlarged prostate but has never had urinary retention before. Rates the pain as a 6/10. He recently received chemotherapy.  Past Medical History  Diagnosis Date  . Hypertension   . Hyperlipidemia   . Hypothyroidism   . Abnormal LFTs   . Gallbladder polyp   . BPH (benign prostatic hyperplasia)   . Actinic keratosis   . Lung nodule     benign BX 03/24/2008  . Anemia   . H/O asbestos exposure   . Pneumonia     hx of walking PNA  . Shortness of breath     with exertion  . Constipation     from medications  . Arthritis   . Kidney stone August 2014    hx of  . Birth mark     left arm and shoulder; Dark purple on upper L chest ;NOT A RASH   Past Surgical History  Procedure Laterality Date  . Back surgery      Dr Collie Siad 1978 and 1982  . Cervical disc surgery    . Right foot surgery      plantar fascitis  . Right shoulder surgery      torn rotator cuff  . Right ankle fracture         . Right wrist fracture    . Foot surgery Left   . Tonsillectomy    . Appendectomy    . Colonoscopy    . Flexible bronchoscopy N/A 12/07/2012    Procedure:  FLEXIBLE BRONCHOSCOPY;  Surgeon: Gaye Pollack, MD;  Location: [facility] OR;  Service: Thoracic;  Laterality: N/A;  . Video assisted thoracoscopy (vats)/thorocotomy Left 12/07/2012    Procedure: VIDEO ASSISTED THORACOSCOPY (VATS)/ EXPLORATORY THOROCOTOMY;  Surgeon: Gaye Pollack, MD;  Location: [facility] OR;  Service: Thoracic;  Laterality: Left;   Family History  Problem Relation Age of Onset  . Hypertension Father   . Heart disease Father     smoker  . Alzheimer's disease Mother   . Breast cancer Sister   . Skin cancer Brother   . Heart disease Son    History  Substance Use Topics  . Smoking status: Former Smoker -- 1.00 packs/day for 40 years    Types: Cigarettes    Quit date: 12/20/1992  . Smokeless tobacco: Not on file  . Alcohol Use: No    Review of Systems  Constitutional: Positive for fever.  Gastrointestinal: Positive for abdominal pain. Negative for vomiting.  Genitourinary: Positive for dysuria, decreased urine volume and difficulty urinating.  Musculoskeletal: Positive for back pain.  All other systems reviewed and are negative.     Allergies  Codeine  Home Medications   Prior to Admission medications   Medication Sig Start Date End Date Taking? Authorizing Provider  Alum & Mag Hydroxide-Simeth (MAGIC MOUTHWASH W/LIDOCAINE) SOLN 1part nystatin,1part Maaloxplus,1part benadryl,3part 2%viscous lidocaine. Swallow 10 mL up to QID, 61min before meals/bedtime 01/21/13   Eppie Gibson, MD  amLODipine (NORVASC) 10 MG tablet Take 10 mg by mouth at bedtime.     Historical Provider, MD  aspirin EC 81 MG tablet Take 81 mg by mouth daily.    Historical Provider, MD  chlorproMAZINE (THORAZINE) 25 MG tablet Take 1 tablet (25 mg total) by mouth 3 (three) times daily as needed. 01/10/14   Curt Bears, MD  dexamethasone (DECADRON) 4 MG tablet 4 mg by mouth twice a day the day before, day of and day after the chemotherapy every 3 weeks. 01/04/14   Curt Bears, MD  folic acid (FOLVITE) 1  MG tablet Take 1 tablet (1 mg total) by mouth daily. 01/04/14   Curt Bears, MD  HYDROcodone-homatropine [facility]) 5-1.5 MG/5ML syrup Take 5 mLs by mouth every 6 (six) hours as needed for cough. 01/02/14   Curt Bears, MD  levothyroxine (SYNTHROID, LEVOTHROID) 125 MCG tablet Take 125 mcg by mouth at bedtime.     Historical Provider, MD  oxyCODONE-acetaminophen (PERCOCET/ROXICET) 5-325 MG per tablet Take 1 tablet by mouth every 6 (six) hours as needed for severe pain. 01/02/14   Curt Bears, MD  prochlorperazine (COMPAZINE) 10 MG tablet Take 1 tablet (10 mg total) by mouth every 6 (six) hours as needed for nausea or vomiting. 01/04/14   Curt Bears, MD  rosuvastatin (CRESTOR) 10 MG tablet Take 10 mg by mouth daily.    Historical Provider, MD  tamsulosin (FLOMAX) 0.4 MG CAPS Take 0.4 mg by mouth at bedtime.     Historical Provider, MD  zolpidem (AMBIEN) 10 MG tablet Take 10 mg by mouth at bedtime.     Historical Provider, MD   BP 130/68  Pulse 88  Temp(Src) 97.9 F (36.6 C) (Oral)  Resp 16  SpO2 100% Physical Exam  Nursing note and vitals reviewed. Constitutional: He is oriented to person, place, and time. He appears well-developed and well-nourished.  HENT:  Head: Normocephalic and atraumatic.  Right Ear: External ear normal.  Left Ear: External ear normal.  Nose: Nose normal.  Eyes: Right eye exhibits no discharge. Left eye exhibits no discharge.  Neck: Neck supple.  Cardiovascular: Normal rate, regular rhythm, normal heart sounds and intact distal pulses.   Pulmonary/Chest: Effort normal.  Abdominal: Soft. There is tenderness in the suprapubic area. There is no CVA tenderness.  Musculoskeletal: He exhibits no edema.  Neurological: He is alert and oriented to person, place, and time.  Skin: Skin is warm and dry.    ED Course  Procedures (including critical care time) Labs Review Labs Reviewed  CBC WITH DIFFERENTIAL - Abnormal; Notable for the following:    RBC 3.62  (*)    Hemoglobin 10.6 (*)    HCT 31.7 (*)    Neutrophils Relative % 92 (*)    Lymphocytes Relative 7 (*)    Lymphs Abs 0.5 (*)    Monocytes Relative 1 (*)    Monocytes Absolute 0.0 (*)    All other components within normal limits  BASIC METABOLIC PANEL - Abnormal; Notable for the following:    Sodium 135 (*)    Glucose, Bld 111 (*)    GFR calc non Af Amer 78 (*)    All other components within normal  limits  URINALYSIS, ROUTINE W REFLEX MICROSCOPIC - Abnormal; Notable for the following:    Hgb urine dipstick SMALL (*)    All other components within normal limits  URINE CULTURE  URINE MICROSCOPIC-ADD ON    Imaging Review Mr Kizzie Fantasia Contrast  01/10/2014   CLINICAL DATA:  Non-small-cell lung cancer.  Staging.  EXAM: MRI HEAD WITHOUT AND WITH CONTRAST  TECHNIQUE: Multiplanar, multiecho pulse sequences of the brain and surrounding structures were obtained without and with intravenous contrast.  CONTRAST:  74mL MULTIHANCE GADOBENATE DIMEGLUMINE 529 MG/ML IV SOLN  COMPARISON:  CT head 10/23/2012.  MR head 08/16/2008.  FINDINGS: No evidence for acute infarction, hemorrhage, mass lesion, hydrocephalus, or extra-axial fluid. Generalized cerebral and cerebellar atrophy. Mild to moderate subcortical and periventricular T2 and FLAIR hyperintensities, likely chronic microvascular ischemic change. Flow voids are maintained throughout the carotid, basilar, and vertebral arteries. There are no areas of chronic hemorrhage. Pituitary, pineal, and cerebellar tonsils unremarkable. No upper cervical lesions.  Post infusion, no abnormal enhancement of the brain or meninges. Visualized calvarium, skull base, and upper cervical osseous structures unremarkable. Scalp and extracranial soft tissues, orbits, sinuses, and mastoids show no acute process.  Compared with priors, there has been slight progression of atrophy.  IMPRESSION: Chronic changes as described. No intracranial metastatic disease is evident.    Electronically Signed   By: Rolla Flatten M.D.   On: 01/10/2014 14:42   Dg Chest Portable 1 View  01/10/2014   CLINICAL DATA:  Cough and congestion with chest pain and shortness of breath for the past 4 weeks; history of lung malignancy, hypertension, and tobacco use  EXAM: PORTABLE CHEST - 1 VIEW  COMPARISON:  CT scan of the chest of December 19, 2013  FINDINGS: There is persistent opacification of the upper 1/3 of the left hemithorax. There is shift of the mediastinum towards the left with superior retraction of the left hilum. There is blunting of the left lateral costophrenic angle which is not new. On the right the lung is hyperinflated. There is no focal infiltrate nor suspicious mass. The heart border is obscured due to mediastinal shift. The pulmonary vascularity is not engorged. The bony thorax is unremarkable.  IMPRESSION: There is persistent left-sided volume loss with opacity of the upper 1/3 of the hemi thorax. The right lung is clear. There is no evidence of CHF.   Electronically Signed   By: David  Martinique   On: 01/10/2014 15:30     EKG Interpretation None      MDM   Final diagnoses:  Urinary retention    Patient with acute urinary retention. Foley catheter placed and over 600 cc of urine was obtained. No signs of UTI as a cause. Kidney function is unchanged. No elevated white blood cell count and no current fevers. He does have a cough and x-ray shows an unchanged amount of volume loss on the left side. At this point will discharge patient with a Foley catheter and recommend close followup with his urologist for removal.    Ephraim Hamburger, MD 01/10/14 1606

## 2014-01-10 NOTE — ED Notes (Signed)
Urinary drainage bag switched to leg bag and pt education done about keeping the catheter clean and changing bag at night. Pt and family voiced understanding.

## 2014-01-10 NOTE — Discharge Instructions (Signed)
Acute Urinary Retention Acute urinary retention is the temporary inability to urinate. This is a common problem in older men. As men age their prostates become larger and block the flow of urine from the bladder. This is usually a problem that has come on gradually.  HOME CARE INSTRUCTIONS If you are sent home with a Foley catheter and a drainage system, you will need to discuss the best course of action with your health care provider. While the catheter is in, maintain a good intake of fluids. Keep the drainage bag emptied and lower than your catheter. This is so that contaminated urine will not flow back into your bladder, which could lead to a urinary tract infection. There are two main types of drainage bags. One is a large bag that usually is used at night. It has a good capacity that will allow you to sleep through the night without having to empty it. The second type is called a leg bag. It has a smaller capacity, so it needs to be emptied more frequently. However, the main advantage is that it can be attached by a leg strap and can go underneath your clothing, allowing you the freedom to move about or leave your home. Only take over-the-counter or prescription medicines for pain, discomfort, or fever as directed by your health care provider.  SEEK MEDICAL CARE IF:  You develop a low-grade fever.  You experience spasms or leakage of urine with the spasms. SEEK IMMEDIATE MEDICAL CARE IF:   You develop chills or fever.  Your catheter stops draining urine.  Your catheter falls out.  You start to develop increased bleeding that does not respond to rest and increased fluid intake. MAKE SURE YOU:  Understand these instructions.  Will watch your condition.  Will get help right away if you are not doing well or get worse. Document Released: 07/04/2000 Document Revised: 04/02/2013 Document Reviewed: 09/06/2012 [facility] Patient Information 2015 [place], [place]. This information is not  intended to replace advice given to you by your health care provider. Make sure you discuss any questions you have with your health care provider.    Foley Catheter Care A Foley catheter is a soft, flexible tube that is placed into the bladder to drain urine. A Foley catheter may be inserted if:  You leak urine or are not able to control when you urinate (urinary incontinence).  You are not able to urinate when you need to (urinary retention).  You had prostate surgery or surgery on the genitals.  You have certain medical conditions, such as multiple sclerosis, dementia, or a spinal cord injury. If you are going home with a Foley catheter in place, follow the instructions below. TAKING CARE OF THE CATHETER 1. Wash your hands with soap and water. 2. Using mild soap and warm water on a clean washcloth:  Clean the area on your body closest to the catheter insertion site using a circular motion, moving away from the catheter. Never wipe toward the catheter because this could sweep bacteria up into the urethra and cause infection.  Remove all traces of soap. Pat the area dry with a clean towel. For males, reposition the foreskin. 3. Attach the catheter to your leg so there is no tension on the catheter. Use adhesive tape or a leg strap. If you are using adhesive tape, remove any sticky residue left behind by the previous tape you used. 4. Keep the drainage bag below the level of the bladder, but keep it off the floor.  5. Check throughout the day to be sure the catheter is working and urine is draining freely. Make sure the tubing does not become kinked. 6. Do not pull on the catheter or try to remove it. Pulling could damage internal tissues. TAKING CARE OF THE DRAINAGE BAGS You will be given two drainage bags to take home. One is a large overnight drainage bag, and the other is a smaller leg bag that fits underneath clothing. You may wear the overnight bag at any time, but you should never  wear the smaller leg bag at night. Follow the instructions below for how to empty, change, and clean your drainage bags. Emptying the Drainage Bag You must empty your drainage bag when it is  - full or at least 2-3 times a day. 1. Wash your hands with soap and water. 2. Keep the drainage bag below your hips, below the level of your bladder. This stops urine from going back into the tubing and into your bladder. 3. Hold the dirty bag over the toilet or a clean container. 4. Open the pour spout at the bottom of the bag and empty the urine into the toilet or container. Do not let the pour spout touch the toilet, container, or any other surface. Doing so can place bacteria on the bag, which can cause an infection. 5. Clean the pour spout with a gauze pad or cotton ball that has rubbing alcohol on it. 6. Close the pour spout. 7. Attach the bag to your leg with adhesive tape or a leg strap. 8. Wash your hands well. Changing the Drainage Bag Change your drainage bag once a month or sooner if it starts to smell bad or look dirty. Below are steps to follow when changing the drainage bag. 1. Wash your hands with soap and water. 2. Pinch off the rubber catheter so that urine does not spill out. 3. Disconnect the catheter tube from the drainage tube at the connection valve. Do not let the tubes touch any surface. 4. Clean the end of the catheter tube with an alcohol wipe. Use a different alcohol wipe to clean the end of the drainage tube. 5. Connect the catheter tube to the drainage tube of the clean drainage bag. 6. Attach the new bag to the leg with adhesive tape or a leg strap. Avoid attaching the new bag too tightly. 7. Wash your hands well. Cleaning the Drainage Bag 1. Wash your hands with soap and water. 2. Wash the bag in warm, soapy water. 3. Rinse the bag thoroughly with warm water. 4. Fill the bag with a solution of white vinegar and water (1 cup vinegar to 1 qt warm water [.2 L vinegar to 1 L  warm water]). Close the bag and soak it for 30 minutes in the solution. 5. Rinse the bag with warm water. 6. Hang the bag to dry with the pour spout open and hanging downward. 7. Store the clean bag (once it is dry) in a clean plastic bag. 8. Wash your hands well. PREVENTING INFECTION  Wash your hands before and after handling your catheter.  Take showers daily and wash the area where the catheter enters your body. Do not take baths. Replace wet leg straps with dry ones, if this applies.  Do not use powders, sprays, or lotions on the genital area. Only use creams, lotions, or ointments as directed by your caregiver.  For females, wipe from front to back after each bowel movement.  Drink enough fluids to  keep your urine clear or pale yellow unless you have a fluid restriction.  Do not let the drainage bag or tubing touch or lie on the floor.  Wear cotton underwear to absorb moisture and to keep your skin drier. SEEK MEDICAL CARE IF:   Your urine is cloudy or smells unusually bad.  Your catheter becomes clogged.  You are not draining urine into the bag or your bladder feels full.  Your catheter starts to leak. SEEK IMMEDIATE MEDICAL CARE IF:   You have pain, swelling, redness, or pus where the catheter enters the body.  You have pain in the abdomen, legs, lower back, or bladder.  You have a fever.  You see blood fill the catheter, or your urine is pink or red.  You have nausea, vomiting, or chills.  Your catheter gets pulled out. MAKE SURE YOU:   Understand these instructions.  Will watch your condition.  Will get help right away if you are not doing well or get worse. Document Released: 03/28/2005 Document Revised: 08/12/2013 Document Reviewed: 03/19/2012 [facility] Patient Information 2015 [place], [place]. This information is not intended to replace advice given to you by your health care provider. Make sure you discuss any questions you have with your health care  provider.

## 2014-01-10 NOTE — Telephone Encounter (Signed)
Daughter called to say patient has not voided in 3 hours and is trying to void. "feels like penis is on fire from dye from CT yesterday". Does not want to come into [facility] to be seen today. Instructed daughter to call us in 1 hour for an update.

## 2014-01-10 NOTE — Telephone Encounter (Signed)
Hiccoughs for the past 3 days and insomnia. He is taking ambien for sleep and it is not helping. Pt thinks it is related to folic acid . I explained to his daughter that these symptoms are likely related to decadron premeds. I told her once the med is out of his system his symptoms should resolve. Per Julien Nordmann i called in thorazine

## 2014-01-12 LAB — URINE CULTURE
CULTURE: NO GROWTH
Colony Count: NO GROWTH

## 2014-01-16 ENCOUNTER — Ambulatory Visit (HOSPITAL_BASED_OUTPATIENT_CLINIC_OR_DEPARTMENT_OTHER): Payer: Medicare Other | Admitting: Physician Assistant

## 2014-01-16 ENCOUNTER — Telehealth: Payer: Self-pay | Admitting: Internal Medicine

## 2014-01-16 ENCOUNTER — Other Ambulatory Visit (HOSPITAL_BASED_OUTPATIENT_CLINIC_OR_DEPARTMENT_OTHER): Payer: Medicare Other

## 2014-01-16 ENCOUNTER — Encounter: Payer: Self-pay | Admitting: Physician Assistant

## 2014-01-16 VITALS — BP 119/57 | HR 63 | Temp 97.9°F | Resp 16 | Wt 169.4 lb

## 2014-01-16 DIAGNOSIS — K5900 Constipation, unspecified: Secondary | ICD-10-CM

## 2014-01-16 DIAGNOSIS — C3412 Malignant neoplasm of upper lobe, left bronchus or lung: Secondary | ICD-10-CM

## 2014-01-16 DIAGNOSIS — R0602 Shortness of breath: Secondary | ICD-10-CM

## 2014-01-16 DIAGNOSIS — C3492 Malignant neoplasm of unspecified part of left bronchus or lung: Secondary | ICD-10-CM

## 2014-01-16 LAB — CBC WITH DIFFERENTIAL/PLATELET
BASO%: 0.6 % (ref 0.0–2.0)
Basophils Absolute: 0 10*3/uL (ref 0.0–0.1)
EOS%: 4.1 % (ref 0.0–7.0)
Eosinophils Absolute: 0.1 10*3/uL (ref 0.0–0.5)
HEMATOCRIT: 32.7 % — AB (ref 38.4–49.9)
HGB: 10.4 g/dL — ABNORMAL LOW (ref 13.0–17.1)
LYMPH%: 18.5 % (ref 14.0–49.0)
MCH: 28.3 pg (ref 27.2–33.4)
MCHC: 31.7 g/dL — ABNORMAL LOW (ref 32.0–36.0)
MCV: 89.4 fL (ref 79.3–98.0)
MONO#: 0.3 10*3/uL (ref 0.1–0.9)
MONO%: 8.7 % (ref 0.0–14.0)
NEUT#: 2.3 10*3/uL (ref 1.5–6.5)
NEUT%: 68.1 % (ref 39.0–75.0)
PLATELETS: 104 10*3/uL — AB (ref 140–400)
RBC: 3.65 10*6/uL — ABNORMAL LOW (ref 4.20–5.82)
RDW: 14.4 % (ref 11.0–14.6)
WBC: 3.3 10*3/uL — ABNORMAL LOW (ref 4.0–10.3)
lymph#: 0.6 10*3/uL — ABNORMAL LOW (ref 0.9–3.3)

## 2014-01-16 LAB — COMPREHENSIVE METABOLIC PANEL (CC13)
ALT: 138 U/L — AB (ref 0–55)
ANION GAP: 7 meq/L (ref 3–11)
AST: 96 U/L — ABNORMAL HIGH (ref 5–34)
Albumin: 3.1 g/dL — ABNORMAL LOW (ref 3.5–5.0)
Alkaline Phosphatase: 248 U/L — ABNORMAL HIGH (ref 40–150)
BILIRUBIN TOTAL: 0.5 mg/dL (ref 0.20–1.20)
BUN: 17.8 mg/dL (ref 7.0–26.0)
CO2: 27 meq/L (ref 22–29)
CREATININE: 0.9 mg/dL (ref 0.7–1.3)
Calcium: 9.5 mg/dL (ref 8.4–10.4)
Chloride: 105 mEq/L (ref 98–109)
Glucose: 155 mg/dl — ABNORMAL HIGH (ref 70–140)
Potassium: 4.2 mEq/L (ref 3.5–5.1)
Sodium: 139 mEq/L (ref 136–145)
Total Protein: 7.3 g/dL (ref 6.4–8.3)

## 2014-01-16 MED ORDER — TRAMADOL HCL 50 MG PO TABS: 50.0000 mg | ORAL_TABLET | Freq: Four times a day (QID) | ORAL | Status: DC | PRN

## 2014-01-16 NOTE — Progress Notes (Signed)
[place]  [phone] [phone]  OFFICE VISIT PROGRESS NOTE  [name], [place] [address]  DIAGNOSIS: Recurrent non-small cell lung cancer initially diagnosed as Stage IIIA (T1b, N2, M0) non-small cell lung cancer, adenocarcinoma with negative EGFR mutation and negative ALK gene translocation  PRIOR THERAPY: Concurrent chemoradiation with weekly carboplatin for an AUC of 2 and paclitaxel at 45 mg per meter squared concurrent with radiation therapy under the care of Dr. Lisbeth Renshaw. He status post 7 cycles of chemotherapy. Last dose was given 02/11/2013 with partial response.  CURRENT THERAPY: Systemic chemotherapy with carboplatin for AUC of 5 and Alimta 500 mg/M2 every 3 weeks. First dose on 01/07/2014  DISEASE STAGE: Stage IIIA (T1b, N2, M0)  CHEMOTHERAPY INTENT: Palliative.  CURRENT # OF CHEMOTHERAPY CYCLES: 1  CURRENT ANTIEMETICS: Zofran, dexamethasone, and Compazine for at home use  CURRENT SMOKING STATUS: Former smoker quit 12/20/1992  ORAL CHEMOTHERAPY AND CONSENT: n/a  CURRENT BISPHOSPHONATES USE: None  PAIN MANAGEMENT: Dilaudid  NARCOTICS INDUCED CONSTIPATION: None  LIVING WILL AND CODE STATUS: ?  INTERVAL HISTORY: DAMASO LADAY 80 y.o. male returns for followup visit accompanied by his 3 daughters. He is tolerating this chemotherapy relatively well. He does report that he D. the emergency room because of urinary retention. He had a catheter placed and was followed up by his urologists in the catheter was removed. He does have a history of enlarged prostate. States that his urologist felt that the Percocet and the Hycodan may have caused the urinary retention. He continues to complain of cough that is worse in the mornings.  He does have some Tessalon Perles at home but is not currently taking them. He also has shortness breath with exertion. The patient denied having any nausea or vomiting. He  denied having any fever or chills. He had an MRI of the brain and presents to discuss the results.  MEDICAL HISTORY: Past Medical History  Diagnosis Date  . Hypertension   . Hyperlipidemia   . Hypothyroidism   . Abnormal LFTs   . Gallbladder polyp   . BPH (benign prostatic hyperplasia)   . Actinic keratosis   . Lung nodule     benign BX 03/24/2008  . Anemia   . H/O asbestos exposure   . Pneumonia     hx of walking PNA  . Shortness of breath     with exertion  . Constipation     from medications  . Arthritis   . Kidney stone August 2014    hx of  . Birth mark     left arm and shoulder; Dark purple on upper L chest ;NOT A RASH    ALLERGIES:  is allergic to codeine.  MEDICATIONS:  Current Outpatient Prescriptions  Medication Sig Dispense Refill  . amLODipine (NORVASC) 10 MG tablet Take 10 mg by mouth at bedtime.       Marland Kitchen aspirin EC 81 MG tablet Take 81 mg by mouth daily.      . benzonatate (TESSALON) 200 MG capsule       . dexamethasone (DECADRON) 4 MG tablet 4 mg by mouth twice a day the day before, day of and day after the chemotherapy every 3 weeks.  40 tablet  1  . folic acid (FOLVITE) 1 MG tablet Take 1 tablet (1 mg total) by mouth daily.  30 tablet  4  . levothyroxine (SYNTHROID, LEVOTHROID) 125 MCG tablet Take 125 mcg by mouth at bedtime.       Marland Kitchen  prochlorperazine (COMPAZINE) 10 MG tablet Take 1 tablet (10 mg total) by mouth every 6 (six) hours as needed for nausea or vomiting.  30 tablet  0  . rosuvastatin (CRESTOR) 10 MG tablet Take 10 mg by mouth once a week.       . tamsulosin (FLOMAX) 0.4 MG CAPS Take 0.4 mg by mouth at bedtime.       Marland Kitchen zolpidem (AMBIEN) 10 MG tablet Take 10 mg by mouth at bedtime.       Marland Kitchen azithromycin (ZITHROMAX) 250 MG tablet       . chlorproMAZINE (THORAZINE) 25 MG tablet Take 25 mg by mouth 3 (three) times daily as needed (hiccoughs).      Marland Kitchen HYDROcodone-homatropine (HYCODAN) 5-1.5 MG/5ML syrup Take 5 mLs by mouth every 6 (six) hours as needed  for cough (coughing).      Marland Kitchen oxyCODONE-acetaminophen (PERCOCET/ROXICET) 5-325 MG per tablet Take 1 tablet by mouth every 6 (six) hours as needed for moderate pain or severe pain (kidney & back pain).      . traMADol (ULTRAM) 50 MG tablet Take 1 tablet (50 mg total) by mouth every 6 (six) hours as needed.  30 tablet  0   No current facility-administered medications for this visit.    SURGICAL HISTORY:  Past Surgical History  Procedure Laterality Date  . Back surgery      Dr Collie Siad 1978 and 1982  . Cervical disc surgery    . Right foot surgery      plantar fascitis  . Right shoulder surgery      torn rotator cuff  . Right ankle fracture         . Right wrist fracture    . Foot surgery Left   . Tonsillectomy    . Appendectomy    . Colonoscopy    . Flexible bronchoscopy N/A 12/07/2012    Procedure: FLEXIBLE BRONCHOSCOPY;  Surgeon: Gaye Pollack, MD;  Location: [facility] OR;  Service: Thoracic;  Laterality: N/A;  . Video assisted thoracoscopy (vats)/thorocotomy Left 12/07/2012    Procedure: VIDEO ASSISTED THORACOSCOPY (VATS)/ EXPLORATORY THOROCOTOMY;  Surgeon: Gaye Pollack, MD;  Location: [facility] OR;  Service: Thoracic;  Laterality: Left;    REVIEW OF SYSTEMS:  Constitutional: positive for fatigue Eyes: negative Ears, nose, mouth, throat, and face: negative Respiratory: positive for cough, dyspnea on exertion and pleurisy/chest pain Cardiovascular: negative Gastrointestinal: negative Genitourinary:positive for recent urinary retention, now resolved Integument/breast: negative Hematologic/lymphatic: negative Musculoskeletal:negative Neurological: negative Behavioral/Psych: negative Endocrine: negative Allergic/Immunologic: negative   PHYSICAL EXAMINATION: General appearance: alert, cooperative, appears stated age and no distress Head: Normocephalic, without obvious abnormality, atraumatic Neck: no adenopathy, no JVD, supple, symmetrical, trachea midline and thyroid not enlarged, symmetric,  no tenderness/mass/nodules Lymph nodes: Cervical, supraclavicular, and axillary nodes normal. Resp: diminished breath sounds LUL and dullness to percussion LUL Cardio: regular rate and rhythm, S1, S2 normal, no murmur, click, rub or gallop GI: soft, non-tender; bowel sounds normal; no masses,  no organomegaly Extremities: extremities normal, atraumatic, no cyanosis or edema Neurologic: Alert and oriented X 3, normal strength and tone. Normal symmetric reflexes. Normal coordination and gait  ECOG PERFORMANCE STATUS: 1 - Symptomatic but completely ambulatory  Blood pressure 119/57, pulse 63, temperature 97.9 F (36.6 C), temperature source Oral, resp. rate 16, weight 169 lb 6.4 oz (76.839 kg).  LABORATORY DATA: Lab Results  Component Value Date   WBC 3.3* 01/16/2014   HGB 10.4* 01/16/2014   HCT 32.7* 01/16/2014   MCV 89.4 01/16/2014  PLT 104* 01/16/2014      Chemistry      Component Value Date/Time   NA 139 01/16/2014 1330   NA 135* Jan 24, 2014 1446   K 4.2 01/16/2014 1330   K 4.3 01-24-14 1446   CL 98 01-24-2014 1446   CO2 27 01/16/2014 1330   CO2 24 01/24/2014 1446   BUN 17.8 01/16/2014 1330   BUN 20 2014/01/24 1446   CREATININE 0.9 01/16/2014 1330   CREATININE 0.90 Jan 24, 2014 1446      Component Value Date/Time   CALCIUM 9.5 01/16/2014 1330   CALCIUM 8.7 2014/01/24 1446   ALKPHOS 248* 01/16/2014 1330   ALKPHOS 129* 12/05/2012 1348   AST 96* 01/16/2014 1330   AST 44* 12/05/2012 1348   ALT 138* 01/16/2014 1330   ALT 35 12/05/2012 1348   BILITOT 0.50 01/16/2014 1330   BILITOT 0.5 12/05/2012 1348       RADIOGRAPHIC STUDIES: Mr Jeri Cos HD Contrast  01/24/2014   CLINICAL DATA:  Non-small-cell lung cancer.  Staging.  EXAM: MRI HEAD WITHOUT AND WITH CONTRAST  TECHNIQUE: Multiplanar, multiecho pulse sequences of the brain and surrounding structures were obtained without and with intravenous contrast.  CONTRAST:  24mL MULTIHANCE GADOBENATE DIMEGLUMINE 529 MG/ML IV SOLN  COMPARISON:  CT head  10/23/2012.  MR head 08/16/2008.  FINDINGS: No evidence for acute infarction, hemorrhage, mass lesion, hydrocephalus, or extra-axial fluid. Generalized cerebral and cerebellar atrophy. Mild to moderate subcortical and periventricular T2 and FLAIR hyperintensities, likely chronic microvascular ischemic change. Flow voids are maintained throughout the carotid, basilar, and vertebral arteries. There are no areas of chronic hemorrhage. Pituitary, pineal, and cerebellar tonsils unremarkable. No upper cervical lesions.  Post infusion, no abnormal enhancement of the brain or meninges. Visualized calvarium, skull base, and upper cervical osseous structures unremarkable. Scalp and extracranial soft tissues, orbits, sinuses, and mastoids show no acute process.  Compared with priors, there has been slight progression of atrophy.  IMPRESSION: Chronic changes as described. No intracranial metastatic disease is evident.   Electronically Signed   By: Rolla Flatten M.D.   On: 01-24-2014 14:42   Nm Pet Image Restag (ps) Skull Base To Thigh  01/01/2014   CLINICAL DATA:  Subsequent treatment strategy for non-small-cell left-sided lung cancer.  EXAM: NUCLEAR MEDICINE PET SKULL BASE TO THIGH  TECHNIQUE: 8.7 mCi F-18 FDG was injected intravenously. Full-ring PET imaging was performed from the skull base to thigh after the radiotracer. CT data was obtained and used for attenuation correction and anatomic localization.  FASTING BLOOD GLUCOSE:  Value: 107 mg/dl  COMPARISON:  11/06/2012 PET. multiple chest CTs, most recent 12/19/2013.  FINDINGS: NECK  New hypermetabolism within a left supraclavicular/ low jugular node. This measures 1.4 cm and a S.U.V. max of 15.6 on image 57.  CHEST  Multi focal progressive/recurrent disease throughout the left upper lobe. The most confluent area corresponds to an area of dense masslike consolidation at the left apex. This measures a S.U.V. max of 17.7, including on image 17. Extension of tumor into the  superior segment left lower lobe.  Multiple middle and anterior mediastinal nodal metastasis. Index subcarinal node measures 1.3 cm and a S.U.V. max of 17.4.  Index prevascular node measures 1.1 cm and a S.U.V. max of 9.0 on image 75.  ABDOMEN/PELVIS  No areas of abnormal hypermetabolism.  SKELETON  No abnormal marrow activity. There is decreased marrow activity within the thoracic spine, likely radiation induced.  CT IMAGES PERFORMED FOR ATTENUATION CORRECTION  Cerebral atrophy. Dense carotid  atherosclerosis. Chest findings deferred to recent diagnostic CTs. Bilateral pleural calcifications, consistent with asbestos related pleural disease. Rounded atelectasis in the left lower lobe. Small hiatal hernia. Mild left adrenal nodularity which is low density and likely due to an adenoma. Right renal cysts. Advanced aortic and branch vessel atherosclerosis, with calcified wall thrombus.  Advanced cirrhosis. Marked prostatomegaly. Small bilateral fat containing inguinal hernias.  IMPRESSION: 1. Progressive/recurrent disease, including throughout the left lung, thoracic, and cervical nodal stations. 2. No evidence of subdiaphragmatic disease. 3. Asbestos related pleural disease. 4. Cirrhosis.   Electronically Signed   By: Abigail Miyamoto M.D.   On: 01/01/2014 13:22   Dg Chest Portable 1 View  01/10/2014   CLINICAL DATA:  Cough and congestion with chest pain and shortness of breath for the past 4 weeks; history of lung malignancy, hypertension, and tobacco use  EXAM: PORTABLE CHEST - 1 VIEW  COMPARISON:  CT scan of the chest of December 19, 2013  FINDINGS: There is persistent opacification of the upper 1/3 of the left hemithorax. There is shift of the mediastinum towards the left with superior retraction of the left hilum. There is blunting of the left lateral costophrenic angle which is not new. On the right the lung is hyperinflated. There is no focal infiltrate nor suspicious mass. The heart border is obscured due to  mediastinal shift. The pulmonary vascularity is not engorged. The bony thorax is unremarkable.  IMPRESSION: There is persistent left-sided volume loss with opacity of the upper 1/3 of the hemi thorax. The right lung is clear. There is no evidence of CHF.   Electronically Signed   By: David  Martinique   On: 01/10/2014 15:30   ASSESSMENT/PLAN: This is a very pleasant 80 years old white male recently diagnosed with a stage IIIa non-small cell lung cancer currently undergoing concurrent chemoradiation with weekly carboplatin and paclitaxel status post 7 cycles.  The recent CT scan of the chest as well as PET scan showed evidence for disease progression involving the left lung as well as the thoracic and cervical nodal stations but no evidence for subdiaphragmatic disease. MRI of the brain was without contrast was negative for brain metastasis. He is currently being treated with systemic chemotherapy with carboplatin for AUC of 5 and Alimta 500 mg/M2 every 3 weeks. Status post 1 cycle. For the constipation we'll discontinue the Hycodan cough syrup and begin using the [employer] he has at home. For pain management he will discontinue the Percocet and I have given him a prescription for tramadol 50 mg by mouth every 6 hours as needed for pain. He'll followup in 2 weeks prior to the start of cycle #2. He was advised to call immediately if he has any concerning symptoms in the interval. The patient was advised to call immediately if he has any concerning symptoms in the interval. All questions were answered. The patient knows to call the clinic with any problems, questions or concerns. We can certainly see the patient much sooner if necessary.  Disclaimer: This note was dictated with voice recognition software. Similar sounding words can inadvertently be transcribed and may not be corrected upon review.   Carlton Adam, PA-C 01/16/2014

## 2014-01-16 NOTE — Telephone Encounter (Signed)
gv and printed appt sched and avs for pt for OCT and NOV....sed added tx. °

## 2014-01-19 NOTE — Patient Instructions (Signed)
Take the [facility] as prescribed for your cough. Discontinue the Hycodan cough syrup Discontinue the Percocet and take the tramadol as prescribed for pain management Continue with weekly labs In 2 weeks prior to the start of your next cycle of chemotherapy

## 2014-01-21 ENCOUNTER — Other Ambulatory Visit (HOSPITAL_BASED_OUTPATIENT_CLINIC_OR_DEPARTMENT_OTHER): Payer: Medicare Other

## 2014-01-21 ENCOUNTER — Telehealth: Payer: Self-pay | Admitting: *Deleted

## 2014-01-21 ENCOUNTER — Ambulatory Visit (HOSPITAL_BASED_OUTPATIENT_CLINIC_OR_DEPARTMENT_OTHER): Payer: Medicare Other | Admitting: Nurse Practitioner

## 2014-01-21 VITALS — BP 118/67 | HR 70 | Temp 98.4°F | Resp 18 | Ht 68.0 in | Wt 171.3 lb

## 2014-01-21 DIAGNOSIS — C3492 Malignant neoplasm of unspecified part of left bronchus or lung: Secondary | ICD-10-CM

## 2014-01-21 DIAGNOSIS — C3412 Malignant neoplasm of upper lobe, left bronchus or lung: Secondary | ICD-10-CM

## 2014-01-21 DIAGNOSIS — R7401 Elevation of levels of liver transaminase levels: Secondary | ICD-10-CM

## 2014-01-21 DIAGNOSIS — R740 Nonspecific elevation of levels of transaminase and lactic acid dehydrogenase [LDH]: Secondary | ICD-10-CM

## 2014-01-21 DIAGNOSIS — R530 Neoplastic (malignant) related fatigue: Secondary | ICD-10-CM

## 2014-01-21 DIAGNOSIS — E8809 Other disorders of plasma-protein metabolism, not elsewhere classified: Secondary | ICD-10-CM

## 2014-01-21 DIAGNOSIS — G893 Neoplasm related pain (acute) (chronic): Secondary | ICD-10-CM

## 2014-01-21 DIAGNOSIS — D696 Thrombocytopenia, unspecified: Secondary | ICD-10-CM

## 2014-01-21 DIAGNOSIS — K5900 Constipation, unspecified: Secondary | ICD-10-CM

## 2014-01-21 DIAGNOSIS — R042 Hemoptysis: Secondary | ICD-10-CM

## 2014-01-21 LAB — CBC WITH DIFFERENTIAL/PLATELET
BASO%: 0.2 % (ref 0.0–2.0)
BASOS ABS: 0 10*3/uL (ref 0.0–0.1)
EOS%: 1.5 % (ref 0.0–7.0)
Eosinophils Absolute: 0.1 10*3/uL (ref 0.0–0.5)
HEMATOCRIT: 31.7 % — AB (ref 38.4–49.9)
HEMOGLOBIN: 10.2 g/dL — AB (ref 13.0–17.1)
LYMPH#: 0.5 10*3/uL — AB (ref 0.9–3.3)
LYMPH%: 14.7 % (ref 14.0–49.0)
MCH: 28.6 pg (ref 27.2–33.4)
MCHC: 32.1 g/dL (ref 32.0–36.0)
MCV: 89 fL (ref 79.3–98.0)
MONO#: 0.4 10*3/uL (ref 0.1–0.9)
MONO%: 10.4 % (ref 0.0–14.0)
NEUT#: 2.7 10*3/uL (ref 1.5–6.5)
NEUT%: 73.2 % (ref 39.0–75.0)
Platelets: 85 10*3/uL — ABNORMAL LOW (ref 140–400)
RBC: 3.56 10*6/uL — ABNORMAL LOW (ref 4.20–5.82)
RDW: 14.3 % (ref 11.0–14.6)
WBC: 3.7 10*3/uL — ABNORMAL LOW (ref 4.0–10.3)

## 2014-01-21 LAB — COMPREHENSIVE METABOLIC PANEL (CC13)
ALT: 59 U/L — ABNORMAL HIGH (ref 0–55)
AST: 36 U/L — ABNORMAL HIGH (ref 5–34)
Albumin: 3.1 g/dL — ABNORMAL LOW (ref 3.5–5.0)
Alkaline Phosphatase: 208 U/L — ABNORMAL HIGH (ref 40–150)
Anion Gap: 9 mEq/L (ref 3–11)
BUN: 14.2 mg/dL (ref 7.0–26.0)
CALCIUM: 9.1 mg/dL (ref 8.4–10.4)
CHLORIDE: 103 meq/L (ref 98–109)
CO2: 25 meq/L (ref 22–29)
Creatinine: 1.1 mg/dL (ref 0.7–1.3)
GLUCOSE: 158 mg/dL — AB (ref 70–140)
Potassium: 4.1 mEq/L (ref 3.5–5.1)
Sodium: 137 mEq/L (ref 136–145)
Total Bilirubin: 0.7 mg/dL (ref 0.20–1.20)
Total Protein: 7.2 g/dL (ref 6.4–8.3)

## 2014-01-21 MED ORDER — LEVOFLOXACIN 500 MG PO TABS: 500.0000 mg | ORAL_TABLET | Freq: Every day | ORAL | Status: DC

## 2014-01-21 NOTE — Telephone Encounter (Signed)
   Provider input needed: COUGHING UP BLOOD   Reason for call: COUGHING UP BLOOD  Respiratory: positive for cough, hemoptysis, pleurisy/chest pain and sputum   ALLERGIES:  is allergic to codeine.  Patient last received chemotherapy/ treatment on 01/07/14 CARBOPLATIN AND ALIMTA  Patient was last seen in the office on 01/16/14  Next appt is 01/28/14  Is patient having fevers greater than 100.5?  no, 99.3 LAST NIGHT. DID NOT TAKE TEMPERATURE THIS MORNING.   Is patient having uncontrolled pain, or new pain? yes, RIGHT SIDE OF CHEST   Is patient having new back pain that changes with position (worsens or eases when laying down?)  no   Is patient able to eat and drink? yes    Is patient able to pass stool without difficulty?   yes     Is patient having uncontrolled nausea?  no    patient calls 01/21/2014 with complaint of COUGHING UP BLOOD.   Summary Based on the above information advised patient to  SEE CINDEE BACON,NP.   [name]  01/21/2014, 11:16 AM   Background Info  Christopher Roach   DOB: 05-08-1930   MR#: [id number]   CSN#   [id number] 01/21/2014

## 2014-01-22 ENCOUNTER — Encounter: Payer: Self-pay | Admitting: Nurse Practitioner

## 2014-01-22 DIAGNOSIS — R5383 Other fatigue: Secondary | ICD-10-CM | POA: Insufficient documentation

## 2014-01-22 DIAGNOSIS — G893 Neoplasm related pain (acute) (chronic): Secondary | ICD-10-CM | POA: Insufficient documentation

## 2014-01-22 DIAGNOSIS — R7401 Elevation of levels of liver transaminase levels: Secondary | ICD-10-CM | POA: Insufficient documentation

## 2014-01-22 DIAGNOSIS — D696 Thrombocytopenia, unspecified: Secondary | ICD-10-CM | POA: Insufficient documentation

## 2014-01-22 DIAGNOSIS — E8809 Other disorders of plasma-protein metabolism, not elsewhere classified: Secondary | ICD-10-CM | POA: Insufficient documentation

## 2014-01-22 DIAGNOSIS — K5900 Constipation, unspecified: Secondary | ICD-10-CM | POA: Insufficient documentation

## 2014-01-22 DIAGNOSIS — R740 Nonspecific elevation of levels of transaminase and lactic acid dehydrogenase [LDH]: Secondary | ICD-10-CM

## 2014-01-22 DIAGNOSIS — R042 Hemoptysis: Secondary | ICD-10-CM | POA: Insufficient documentation

## 2014-01-22 NOTE — Assessment & Plan Note (Signed)
Albumin is decreased today.  Patient was encouraged to push protein in his diet is much as possible.

## 2014-01-22 NOTE — Assessment & Plan Note (Signed)
Patient initiated cycle 1 of his carboplatin/Alimta chemotherapy on 01/07/2014.  He will be scheduled for cycle 2 of the same chemotherapy regimen on 01/28/2014.

## 2014-01-22 NOTE — Assessment & Plan Note (Signed)
Patient continues with some generalized pain.  He had some problems with urinary retention due to narcotic pain medication; but states that he has had no further urinary retention issues since of switching to Ultram and using Tessalon Perles for cough.

## 2014-01-22 NOTE — Assessment & Plan Note (Signed)
Both AST and ALT have greatly improved since initiating chemotherapy.  I will continue to monitor.

## 2014-01-22 NOTE — Progress Notes (Signed)
SYMPTOM MANAGEMENT CLINIC   HPI: Christopher Roach 80 y.o. male diagnosed with lung cancer.  Currently undergoing carboplatin/Alimta chemotherapy regimen.  Patient called the cancer Center today requesting urgent care visit.  He initiated his first cycle of carboplatin/Alimta chemotherapy regimen on 01/07/2014.  He is complaining of some worsening fatigue; but states his appetite remains stable.  He has had a few different episodes of hemoptysis within the past several days.  He states that his generalized pain is under control with Ultram and Tessalon Perles for cough.  He's had no further issues with urinary retention I since changing his pain medication.  He does continue to complain of some chronic constipation; stating that his last bowel movement was personally 5 days ago.  He admits he has not been taking stool softeners or laxatives as directed per bowel regimen.  Patient denies any nausea or vomiting.  He also denies any recent fevers or chills.   HPI  CURRENT THERAPY: Upcoming Treatment Dates - LUNG Pemetrexed (Alimta) / Carboplatin q21d Days with orders from any treatment category:  01/28/2014      SCHEDULING COMMUNICATION      ondansetron (ZOFRAN) IVPB 16 mg      Dexamethasone Sodium Phosphate (DECADRON) injection 20 mg      PEMEtrexed (ALIMTA) 975 mg in sodium chloride 0.9 % 100 mL chemo infusion      CARBOplatin (PARAPLATIN) in sodium chloride 0.9 % 100 mL chemo infusion      sodium chloride 0.9 % injection 10 mL      heparin lock flush 100 unit/mL      heparin lock flush 100 unit/mL      alteplase (CATHFLO ACTIVASE) injection 2 mg      sodium chloride 0.9 % injection 3 mL      0.9 %  sodium chloride infusion      TREATMENT CONDITIONS 02/18/2014      [facility] SCHEDULING COMMUNICATION      ONDANSETRON IVPB ORDERABLE ([facility])      DEXAMETHASONE INJECTION ORDERABLE [facility]      PEMEtrexed (ALIMTA) 975 mg in sodium chloride 0.9 % 100 mL chemo infusion      CARBOplatin  (PARAPLATIN) in sodium chloride 0.9 % 100 mL chemo infusion      SODIUM CHLORIDE 0.9 % IJ SOLN      HEPARIN SODIUM LOCK FLUSH 100 UNIT/ML IV SOLN      HEPARIN SODIUM LOCK FLUSH 100 UNIT/ML IV SOLN      ALTEPLASE 2 MG IJ SOLR      SODIUM CHLORIDE 0.9 % IJ SOLN      cyanocobalamin ((VITAMIN B-12)) injection 1,000 mcg      SODIUM CHLORIDE 0.9 % IV SOLN      TREATMENT CONDITIONS 03/11/2014      [facility] SCHEDULING COMMUNICATION      ONDANSETRON IVPB ORDERABLE ([facility])      DEXAMETHASONE INJECTION ORDERABLE [facility]      PEMEtrexed (ALIMTA) 975 mg in sodium chloride 0.9 % 100 mL chemo infusion      CARBOplatin (PARAPLATIN) in sodium chloride 0.9 % 100 mL chemo infusion      SODIUM CHLORIDE 0.9 % IJ SOLN      HEPARIN SODIUM LOCK FLUSH 100 UNIT/ML IV SOLN      HEPARIN SODIUM LOCK FLUSH 100 UNIT/ML IV SOLN      ALTEPLASE 2 MG IJ SOLR      SODIUM CHLORIDE 0.9 % IJ SOLN      SODIUM CHLORIDE  0.9 % IV SOLN      TREATMENT CONDITIONS    ROS  Past Medical History  Diagnosis Date  . Hypertension   . Hyperlipidemia   . Hypothyroidism   . Abnormal LFTs   . Gallbladder polyp   . BPH (benign prostatic hyperplasia)   . Actinic keratosis   . Lung nodule     benign BX 03/24/2008  . Anemia   . H/O asbestos exposure   . Pneumonia     hx of walking PNA  . Shortness of breath     with exertion  . Constipation     from medications  . Arthritis   . Kidney stone August 2014    hx of  . Birth mark     left arm and shoulder; Dark purple on upper L chest ;NOT A RASH    Past Surgical History  Procedure Laterality Date  . Back surgery      Dr Collie Siad 1978 and 1982  . Cervical disc surgery    . Right foot surgery      plantar fascitis  . Right shoulder surgery      torn rotator cuff  . Right ankle fracture         . Right wrist fracture    . Foot surgery Left   . Tonsillectomy    . Appendectomy    . Colonoscopy    . Flexible bronchoscopy N/A 12/07/2012    Procedure: FLEXIBLE BRONCHOSCOPY;   Surgeon: Gaye Pollack, MD;  Location: [facility] OR;  Service: Thoracic;  Laterality: N/A;  . Video assisted thoracoscopy (vats)/thorocotomy Left 12/07/2012    Procedure: VIDEO ASSISTED THORACOSCOPY (VATS)/ EXPLORATORY THOROCOTOMY;  Surgeon: Gaye Pollack, MD;  Location: [facility] OR;  Service: Thoracic;  Laterality: Left;    has Nodule of left lung; Lung cancer, upper lobe; Non-small cell cancer of left lung; Fatigue; Neoplasm related pain; Constipation; Hyperphosphatemia; Transaminitis; Hypoalbuminemia; Thrombocytopenia; and Hemoptysis on his problem list.     is allergic to codeine.    Medication List       This list is accurate as of: 01/21/14 11:59 PM.  Always use your most recent med list.               amLODipine 10 MG tablet  Commonly known as:  NORVASC  Take 10 mg by mouth at bedtime.     aspirin EC 81 MG tablet  Take 81 mg by mouth daily.     benzonatate 200 MG capsule  Commonly known as:  TESSALON     dexamethasone 4 MG tablet  Commonly known as:  DECADRON  4 mg by mouth twice a day the day before, day of and day after the chemotherapy every 3 weeks.     folic acid 1 MG tablet  Commonly known as:  FOLVITE  Take 1 tablet (1 mg total) by mouth daily.     levofloxacin 500 MG tablet  Commonly known as:  LEVAQUIN  Take 1 tablet (500 mg total) by mouth daily.     levothyroxine 125 MCG tablet  Commonly known as:  SYNTHROID, LEVOTHROID  Take 125 mcg by mouth at bedtime.     prochlorperazine 10 MG tablet  Commonly known as:  COMPAZINE  Take 1 tablet (10 mg total) by mouth every 6 (six) hours as needed for nausea or vomiting.     rosuvastatin 10 MG tablet  Commonly known as:  CRESTOR  Take 10 mg by mouth once a week.  tamsulosin 0.4 MG Caps capsule  Commonly known as:  FLOMAX  Take 0.4 mg by mouth at bedtime.     traMADol 50 MG tablet  Commonly known as:  ULTRAM  Take 1 tablet (50 mg total) by mouth every 6 (six) hours as needed.     zolpidem 10 MG tablet    Commonly known as:  AMBIEN  Take 10 mg by mouth at bedtime.         PHYSICAL EXAMINATION  Blood pressure 118/67, pulse 70, temperature 98.4 F (36.9 C), temperature source Oral, resp. rate 18, height 5\' 8"  (1.727 m), weight 171 lb 4.8 oz (77.701 kg), SpO2 98.00%.  Physical Exam  Nursing note and vitals reviewed. Constitutional: He is oriented to person, place, and time and well-developed, well-nourished, and in no distress.  HENT:  Head: Normocephalic.  Mouth/Throat: Oropharynx is clear and moist.  Eyes: Conjunctivae and EOM are normal. Pupils are equal, round, and reactive to light. No scleral icterus.  Neck: Normal range of motion. Neck supple. No JVD present. No tracheal deviation present. No thyromegaly present.  Cardiovascular: Normal rate, regular rhythm, normal heart sounds and intact distal pulses.   Pulmonary/Chest: Effort normal and breath sounds normal. No respiratory distress. He has no wheezes. He has no rales.  Occasional nonproductive cough on exam.  No hemoptysis on exam.  Abdominal: Soft. Bowel sounds are normal. He exhibits no distension. There is no tenderness. There is no rebound.  Musculoskeletal: Normal range of motion. He exhibits no edema.  Lymphadenopathy:    He has no cervical adenopathy.  Neurological: He is alert and oriented to person, place, and time. Gait normal.  Skin: Skin is warm and dry. No rash noted. No erythema.  Psychiatric: Affect normal.    LABORATORY DATA:. Appointment on 01/21/2014  Component Date Value Ref Range Status  . WBC 01/21/2014 3.7* 4.0 - 10.3 10e3/uL Final  . NEUT# 01/21/2014 2.7  1.5 - 6.5 10e3/uL Final  . HGB 01/21/2014 10.2* 13.0 - 17.1 g/dL Final  . HCT 01/21/2014 31.7* 38.4 - 49.9 % Final  . Platelets 01/21/2014 85* 140 - 400 10e3/uL Final  . MCV 01/21/2014 89.0  79.3 - 98.0 fL Final  . MCH 01/21/2014 28.6  27.2 - 33.4 pg Final  . MCHC 01/21/2014 32.1  32.0 - 36.0 g/dL Final  . RBC 01/21/2014 3.56* 4.20 - 5.82  10e6/uL Final  . RDW 01/21/2014 14.3  11.0 - 14.6 % Final  . lymph# 01/21/2014 0.5* 0.9 - 3.3 10e3/uL Final  . MONO# 01/21/2014 0.4  0.1 - 0.9 10e3/uL Final  . Eosinophils Absolute 01/21/2014 0.1  0.0 - 0.5 10e3/uL Final  . Basophils Absolute 01/21/2014 0.0  0.0 - 0.1 10e3/uL Final  . NEUT% 01/21/2014 73.2  39.0 - 75.0 % Final  . LYMPH% 01/21/2014 14.7  14.0 - 49.0 % Final  . MONO% 01/21/2014 10.4  0.0 - 14.0 % Final  . EOS% 01/21/2014 1.5  0.0 - 7.0 % Final  . BASO% 01/21/2014 0.2  0.0 - 2.0 % Final  . Sodium 01/21/2014 137  136 - 145 mEq/L Final  . Potassium 01/21/2014 4.1  3.5 - 5.1 mEq/L Final  . Chloride 01/21/2014 103  98 - 109 mEq/L Final  . CO2 01/21/2014 25  22 - 29 mEq/L Final  . Glucose 01/21/2014 158* 70 - 140 mg/dl Final  . BUN 01/21/2014 14.2  7.0 - 26.0 mg/dL Final  . Creatinine 01/21/2014 1.1  0.7 - 1.3 mg/dL Final  . Total Bilirubin  01/21/2014 0.70  0.20 - 1.20 mg/dL Final  . Alkaline Phosphatase 01/21/2014 208* 40 - 150 U/L Final  . AST 01/21/2014 36* 5 - 34 U/L Final  . ALT 01/21/2014 59* 0 - 55 U/L Final  . Total Protein 01/21/2014 7.2  6.4 - 8.3 g/dL Final  . Albumin 01/21/2014 3.1* 3.5 - 5.0 g/dL Final  . Calcium 01/21/2014 9.1  8.4 - 10.4 mg/dL Final  . Anion Gap 01/21/2014 9  3 - 11 mEq/L Final     RADIOGRAPHIC STUDIES: No results found.  ASSESSMENT/PLAN:    Non-small cell cancer of left lung  Assessment & Plan Patient initiated cycle 1 of his carboplatin/Alimta chemotherapy on 01/07/2014.  He will be scheduled for cycle 2 of the same chemotherapy regimen on 01/28/2014.   Fatigue  Assessment & Plan Most likely, continued chronic fatigue is secondary to both lung cancer diagnosis; as well as chemotherapy-associated fatigue.  Patient was encouraged to remain as active as possible.   Neoplasm related pain  Assessment & Plan Patient continues with some generalized pain.  He had some problems with urinary retention due to narcotic pain medication;  but states that he has had no further urinary retention issues since of switching to Ultram and using Tessalon Perles for cough.   Constipation  Assessment & Plan Patient does continue with some constipation issues.  He states his last bowel movement was partly 5 days ago.  He has only been taking stool softeners occasionally.  He has not tried any laxatives.  Long discussion with both patient and his wife regarding need for a bowel regimen.  Patient was advised to use MiraLAX on an every four-hour basis until his constipation has completely cleared.  Following clearing of his constipation-patient was advised to continue with stool softeners twice daily; and MiraLAX on a daily basis as needed to keep regular.   Hyperphosphatemia  Assessment & Plan Alkaline phosphatase is actually improved from 248 down to 208.  Will continue to monitor.   Transaminitis  Assessment & Plan Both AST and ALT have greatly improved since initiating chemotherapy.  I will continue to monitor.   Hypoalbuminemia  Assessment & Plan Albumin is decreased today.  Patient was encouraged to push protein in his diet is much as possible.   Thrombocytopenia  Assessment & Plan Count has decreased from 104 down to 85.  Patient's recurrence of hemoptysis is most likely secondary to his lung cancer; and worsening thrombocytopenia.  Hopefully, continued chemotherapy will resolve the hemoptysis.  Will continue to monitor platelet count closely.   Patient stated understanding of all instructions; and was in agreement with this plan of care. The patient knows to call the clinic with any problems, questions or concerns.   This was a shared visit with Dr. Julien Nordmann today..   Total time spent with patient was 25 minutes;  with greater than 75 percent of that time spent in face to face counseling regarding his symptoms, and coordination of care and follow up.  Disclaimer: This note was dictated with voice recognition software.  Similar sounding words can inadvertently be transcribed and may not be corrected upon review.   Drue Second, NP 01/22/2014   ADDENDUM: Hematology/Oncology Attending: I had a face to face encounter with the patient. I recommended his care plan. This is a very pleasant 80 years old white male who was recently diagnosed with recurrent non-small cell lung cancer with significant disease in the left side of the chest and mediastinal lymphadenopathy. The patient was started  recently on systemic chemotherapy with carboplatin and Alimta and tolerated the first dose of his treatment fairly well. He came today complaining of few episodes of cough productive of blood-tinged sputum and dark bloody spots with the cough. He denied having any significant bright red hemoptysis. I have a lengthy discussion with the patient and his family today about his condition. I explained to them that this is most likely secondary to his current disease. I discussed with the patient referral to radiation oncology for consideration of palliative radiotherapy to this area if feasible, but the patient is not interested in radiotherapy at this point. I will start him on Levaquin 500 mg by mouth daily for 5 days empirically to treat any inflammatory process that could be leading to his hemoptysis. He was also advised to take his cough medicine as prescribed. He would come back for followup visit as previously scheduled with the start of the second cycle of his chemotherapy. He was advised to call immediately if he has any concerning symptoms in the interval and also to go immediately to the emergency department if he has any frank hemoptysis.  Disclaimer: This note was dictated with voice recognition software. Similar sounding words can inadvertently be transcribed and may be missed upon review. Eilleen Kempf., MD 01/23/2014

## 2014-01-22 NOTE — Assessment & Plan Note (Signed)
Most likely, continued chronic fatigue is secondary to both lung cancer diagnosis; as well as chemotherapy-associated fatigue.  Patient was encouraged to remain as active as possible.

## 2014-01-22 NOTE — Assessment & Plan Note (Signed)
Alkaline phosphatase is actually improved from 248 down to 208.  Will continue to monitor.

## 2014-01-22 NOTE — Assessment & Plan Note (Signed)
Patient does continue with some constipation issues.  He states his last bowel movement was partly 5 days ago.  He has only been taking stool softeners occasionally.  He has not tried any laxatives.  Long discussion with both patient and his wife regarding need for a bowel regimen.  Patient was advised to use MiraLAX on an every four-hour basis until his constipation has completely cleared.  Following clearing of his constipation-patient was advised to continue with stool softeners twice daily; and MiraLAX on a daily basis as needed to keep regular.

## 2014-01-22 NOTE — Assessment & Plan Note (Signed)
Count has decreased from 104 down to 85.  Patient's recurrence of hemoptysis is most likely secondary to his lung cancer; and worsening thrombocytopenia.  Hopefully, continued chemotherapy will resolve the hemoptysis.  Will continue to monitor platelet count closely.

## 2014-01-23 NOTE — Progress Notes (Signed)
Addendum: Added hemoptysis to the problem list as a diagnosis for today's visit on 01/23/2014.

## 2014-01-23 NOTE — Assessment & Plan Note (Signed)
Patient has experienced a few episodes of hemoptysis within the last several days.  Most likely, this is secondary to the patient's diagnosis of lung cancer; as well as thrombocytopenia.  Hopefully, the hemoptysis will improve with his chemotherapy treatment.

## 2014-01-28 ENCOUNTER — Ambulatory Visit (HOSPITAL_BASED_OUTPATIENT_CLINIC_OR_DEPARTMENT_OTHER): Payer: Medicare Other

## 2014-01-28 ENCOUNTER — Ambulatory Visit: Payer: Medicare Other

## 2014-01-28 ENCOUNTER — Other Ambulatory Visit: Payer: Medicare Other

## 2014-01-28 ENCOUNTER — Other Ambulatory Visit (HOSPITAL_BASED_OUTPATIENT_CLINIC_OR_DEPARTMENT_OTHER): Payer: Medicare Other

## 2014-01-28 ENCOUNTER — Ambulatory Visit (HOSPITAL_BASED_OUTPATIENT_CLINIC_OR_DEPARTMENT_OTHER): Payer: Medicare Other | Admitting: Internal Medicine

## 2014-01-28 ENCOUNTER — Encounter: Payer: Self-pay | Admitting: Internal Medicine

## 2014-01-28 VITALS — BP 118/67 | HR 87 | Temp 97.8°F | Resp 18 | Ht 68.0 in | Wt 171.9 lb

## 2014-01-28 DIAGNOSIS — C3492 Malignant neoplasm of unspecified part of left bronchus or lung: Secondary | ICD-10-CM

## 2014-01-28 DIAGNOSIS — R5382 Chronic fatigue, unspecified: Secondary | ICD-10-CM

## 2014-01-28 DIAGNOSIS — C3412 Malignant neoplasm of upper lobe, left bronchus or lung: Secondary | ICD-10-CM

## 2014-01-28 DIAGNOSIS — Z5111 Encounter for antineoplastic chemotherapy: Secondary | ICD-10-CM

## 2014-01-28 DIAGNOSIS — R630 Anorexia: Secondary | ICD-10-CM

## 2014-01-28 DIAGNOSIS — G893 Neoplasm related pain (acute) (chronic): Secondary | ICD-10-CM

## 2014-01-28 DIAGNOSIS — R52 Pain, unspecified: Secondary | ICD-10-CM

## 2014-01-28 LAB — CBC WITH DIFFERENTIAL/PLATELET
BASO%: 0.1 % (ref 0.0–2.0)
BASOS ABS: 0 10*3/uL (ref 0.0–0.1)
EOS%: 0 % (ref 0.0–7.0)
Eosinophils Absolute: 0 10*3/uL (ref 0.0–0.5)
HCT: 30.5 % — ABNORMAL LOW (ref 38.4–49.9)
HEMOGLOBIN: 10 g/dL — AB (ref 13.0–17.1)
LYMPH%: 3.7 % — ABNORMAL LOW (ref 14.0–49.0)
MCH: 29.2 pg (ref 27.2–33.4)
MCHC: 32.7 g/dL (ref 32.0–36.0)
MCV: 89.3 fL (ref 79.3–98.0)
MONO#: 0.5 10*3/uL (ref 0.1–0.9)
MONO%: 7.6 % (ref 0.0–14.0)
NEUT%: 88.6 % — ABNORMAL HIGH (ref 39.0–75.0)
NEUTROS ABS: 5.3 10*3/uL (ref 1.5–6.5)
Platelets: 199 10*3/uL (ref 140–400)
RBC: 3.41 10*6/uL — ABNORMAL LOW (ref 4.20–5.82)
RDW: 15 % — ABNORMAL HIGH (ref 11.0–14.6)
WBC: 6 10*3/uL (ref 4.0–10.3)
lymph#: 0.2 10*3/uL — ABNORMAL LOW (ref 0.9–3.3)

## 2014-01-28 LAB — COMPREHENSIVE METABOLIC PANEL (CC13)
ALBUMIN: 3 g/dL — AB (ref 3.5–5.0)
ALT: 49 U/L (ref 0–55)
AST: 41 U/L — AB (ref 5–34)
Alkaline Phosphatase: 188 U/L — ABNORMAL HIGH (ref 40–150)
Anion Gap: 10 mEq/L (ref 3–11)
BUN: 16.4 mg/dL (ref 7.0–26.0)
CALCIUM: 9.6 mg/dL (ref 8.4–10.4)
CHLORIDE: 105 meq/L (ref 98–109)
CO2: 23 mEq/L (ref 22–29)
Creatinine: 1.1 mg/dL (ref 0.7–1.3)
Glucose: 145 mg/dl — ABNORMAL HIGH (ref 70–140)
Potassium: 4 mEq/L (ref 3.5–5.1)
SODIUM: 138 meq/L (ref 136–145)
TOTAL PROTEIN: 6.9 g/dL (ref 6.4–8.3)
Total Bilirubin: 0.35 mg/dL (ref 0.20–1.20)

## 2014-01-28 MED ORDER — DEXAMETHASONE SODIUM PHOSPHATE 20 MG/5ML IJ SOLN
INTRAMUSCULAR | Status: AC
  Filled 2014-01-28: qty 5

## 2014-01-28 MED ORDER — PEMETREXED DISODIUM CHEMO INJECTION 500 MG
500.0000 mg/m2 | Freq: Once | INTRAVENOUS | Status: AC
  Administered 2014-01-28: 975 mg via INTRAVENOUS
  Filled 2014-01-28: qty 39

## 2014-01-28 MED ORDER — DEXAMETHASONE SODIUM PHOSPHATE 20 MG/5ML IJ SOLN
20.0000 mg | Freq: Once | INTRAMUSCULAR | Status: AC
  Administered 2014-01-28: 20 mg via INTRAVENOUS

## 2014-01-28 MED ORDER — ONDANSETRON 16 MG/50ML IVPB (CHCC)
INTRAVENOUS | Status: AC
  Filled 2014-01-28: qty 16

## 2014-01-28 MED ORDER — ONDANSETRON 16 MG/50ML IVPB (CHCC)
16.0000 mg | Freq: Once | INTRAVENOUS | Status: AC
  Administered 2014-01-28: 16 mg via INTRAVENOUS

## 2014-01-28 MED ORDER — SODIUM CHLORIDE 0.9 % IV SOLN
450.0000 mg | Freq: Once | INTRAVENOUS | Status: AC
  Administered 2014-01-28: 450 mg via INTRAVENOUS
  Filled 2014-01-28: qty 45

## 2014-01-28 MED ORDER — TRAMADOL HCL 50 MG PO TABS: 50.0000 mg | ORAL_TABLET | Freq: Four times a day (QID) | ORAL | Status: DC | PRN

## 2014-01-28 MED ORDER — SODIUM CHLORIDE 0.9 % IV SOLN
Freq: Once | INTRAVENOUS | Status: AC
  Administered 2014-01-28: 14:00:00 via INTRAVENOUS

## 2014-01-28 NOTE — Progress Notes (Signed)
[place]  [phone] [phone]  OFFICE VISIT PROGRESS NOTE  [name], [address] [name] [id number]  DIAGNOSIS: Recurrent non-small cell lung cancer initially diagnosed as Stage IIIA (T1b, N2, M0) non-small cell lung cancer, adenocarcinoma with negative EGFR mutation and negative ALK gene translocation  PRIOR THERAPY: Concurrent chemoradiation with weekly carboplatin for an AUC of 2 and paclitaxel at 45 mg per meter squared concurrent with radiation therapy under the care of Dr. Lisbeth Renshaw. He status post 7 cycles of chemotherapy. Last dose was given 02/11/2013 with partial response.  CURRENT THERAPY: Systemic chemotherapy with carboplatin for AUC of 5 and Alimta 500 mg/M2 every 3 weeks. First dose on 01/07/2014. Status post 1 cycle.  DISEASE STAGE: Stage IIIA (T1b, N2, M0)  CHEMOTHERAPY INTENT: Palliative.  CURRENT # OF CHEMOTHERAPY CYCLES: 2  CURRENT ANTIEMETICS: Zofran, dexamethasone, and Compazine for at home use  CURRENT SMOKING STATUS: Former smoker quit 12/20/1992  ORAL CHEMOTHERAPY AND CONSENT: n/a  CURRENT BISPHOSPHONATES USE: None  PAIN MANAGEMENT: Dilaudid  NARCOTICS INDUCED CONSTIPATION: None  LIVING WILL AND CODE STATUS: ?  INTERVAL HISTORY: Christopher Roach 80 y.o. male returns for followup visit accompanied by his daughter. He is feeling fine today and tolerated the first cycle of his chemotherapy was carboplatin and Alimta fairly well. He was seen last week complaining of cough productive of blood-tinged sputum. The patient was started on treatment with Levaquin 500 mg by mouth daily for 7 days and he felt much better after starting the antibiotics. He still has lack of appetite but no significant weight loss. He also has shortness of breath with exertion. The patient denied having any nausea or vomiting. He denied having any fever or chills. He is here today to start cycle #2 of his systemic  chemotherapy.  MEDICAL HISTORY: Past Medical History  Diagnosis Date  . Hypertension   . Hyperlipidemia   . Hypothyroidism   . Abnormal LFTs   . Gallbladder polyp   . BPH (benign prostatic hyperplasia)   . Actinic keratosis   . Lung nodule     benign BX 03/24/2008  . Anemia   . H/O asbestos exposure   . Pneumonia     hx of walking PNA  . Shortness of breath     with exertion  . Constipation     from medications  . Arthritis   . Kidney stone August 2014    hx of  . Birth mark     left arm and shoulder; Dark purple on upper L chest ;NOT A RASH    ALLERGIES:  is allergic to codeine.  MEDICATIONS:  Current Outpatient Prescriptions  Medication Sig Dispense Refill  . amLODipine (NORVASC) 10 MG tablet Take 10 mg by mouth at bedtime.       Marland Kitchen aspirin EC 81 MG tablet Take 81 mg by mouth daily.      . benzonatate (TESSALON) 200 MG capsule       . dexamethasone (DECADRON) 4 MG tablet 4 mg by mouth twice a day the day before, day of and day after the chemotherapy every 3 weeks.  40 tablet  1  . folic acid (FOLVITE) 1 MG tablet Take 1 tablet (1 mg total) by mouth daily.  30 tablet  4  . levothyroxine (SYNTHROID, LEVOTHROID) 125 MCG tablet Take 125 mcg by mouth at bedtime.       . prochlorperazine (COMPAZINE) 10 MG tablet Take 1 tablet (10 mg total) by mouth  every 6 (six) hours as needed for nausea or vomiting.  30 tablet  0  . rosuvastatin (CRESTOR) 10 MG tablet Take 10 mg by mouth once a week.       . tamsulosin (FLOMAX) 0.4 MG CAPS Take 0.4 mg by mouth at bedtime.       . traMADol (ULTRAM) 50 MG tablet Take 1 tablet (50 mg total) by mouth every 6 (six) hours as needed.  30 tablet  0  . zolpidem (AMBIEN) 10 MG tablet Take 10 mg by mouth at bedtime.        No current facility-administered medications for this visit.    SURGICAL HISTORY:  Past Surgical History  Procedure Laterality Date  . Back surgery      Dr Collie Siad 1978 and 1982  . Cervical disc surgery    . Right foot surgery       plantar fascitis  . Right shoulder surgery      torn rotator cuff  . Right ankle fracture         . Right wrist fracture    . Foot surgery Left   . Tonsillectomy    . Appendectomy    . Colonoscopy    . Flexible bronchoscopy N/A 12/07/2012    Procedure: FLEXIBLE BRONCHOSCOPY;  Surgeon: Gaye Pollack, MD;  Location: [facility] OR;  Service: Thoracic;  Laterality: N/A;  . Video assisted thoracoscopy (vats)/thorocotomy Left 12/07/2012    Procedure: VIDEO ASSISTED THORACOSCOPY (VATS)/ EXPLORATORY THOROCOTOMY;  Surgeon: Gaye Pollack, MD;  Location: [facility] OR;  Service: Thoracic;  Laterality: Left;    REVIEW OF SYSTEMS:  Constitutional: positive for fatigue Eyes: negative Ears, nose, mouth, throat, and face: negative Respiratory: positive for cough, dyspnea on exertion and pleurisy/chest pain Cardiovascular: negative Gastrointestinal: negative Genitourinary:negative Integument/breast: negative Hematologic/lymphatic: negative Musculoskeletal:negative Neurological: negative Behavioral/Psych: negative Endocrine: negative Allergic/Immunologic: negative   PHYSICAL EXAMINATION: General appearance: alert, cooperative, appears stated age and no distress Head: Normocephalic, without obvious abnormality, atraumatic Neck: no adenopathy, no JVD, supple, symmetrical, trachea midline and thyroid not enlarged, symmetric, no tenderness/mass/nodules Lymph nodes: Cervical, supraclavicular, and axillary nodes normal. Resp: diminished breath sounds LUL and dullness to percussion LUL Cardio: regular rate and rhythm, S1, S2 normal, no murmur, click, rub or gallop GI: soft, non-tender; bowel sounds normal; no masses,  no organomegaly Extremities: extremities normal, atraumatic, no cyanosis or edema Neurologic: Alert and oriented X 3, normal strength and tone. Normal symmetric reflexes. Normal coordination and gait  ECOG PERFORMANCE STATUS: 1 - Symptomatic but completely ambulatory  Blood pressure 118/67,  pulse 87, temperature 97.8 F (36.6 C), temperature source Oral, resp. rate 18, height $RemoveBe'5\' 8"'xVdHyBBpb$  (1.727 m), weight 171 lb 14.4 oz (77.973 kg), SpO2 98.00%.  LABORATORY DATA: Lab Results  Component Value Date   WBC 6.0 01/28/2014   HGB 10.0* 01/28/2014   HCT 30.5* 01/28/2014   MCV 89.3 01/28/2014   PLT 199 01/28/2014      Chemistry      Component Value Date/Time   NA 138 01/28/2014 1314   NA 135* 01/10/2014 1446   K 4.0 01/28/2014 1314   K 4.3 01/10/2014 1446   CL 98 01/10/2014 1446   CO2 23 01/28/2014 1314   CO2 24 01/10/2014 1446   BUN 16.4 01/28/2014 1314   BUN 20 01/10/2014 1446   CREATININE 1.1 01/28/2014 1314   CREATININE 0.90 01/10/2014 1446      Component Value Date/Time   CALCIUM 9.6 01/28/2014 1314   CALCIUM 8.7 01/10/2014 1446  ALKPHOS 188* 01/28/2014 1314   ALKPHOS 129* 12/05/2012 1348   AST 41* 01/28/2014 1314   AST 44* 12/05/2012 1348   ALT 49 01/28/2014 1314   ALT 35 12/05/2012 1348   BILITOT 0.35 01/28/2014 1314   BILITOT 0.5 12/05/2012 1348       RADIOGRAPHIC STUDIES:  ASSESSMENT/PLAN: This is a very pleasant 80 years old white male recently diagnosed with a stage IIIa non-small cell lung cancer currently undergoing concurrent chemoradiation with weekly carboplatin and paclitaxel status post 7 cycles.  He is currently on treatment with systemic chemotherapy with carboplatin and Alimta status post 1 cycle for recurrent disease. He tolerated the first cycle of his treatment fairly well with no significant adverse effects. I recommended for the patient to proceed with cycle #2 today as scheduled. For pain management I gave the patient prescription for Percocet 5/325 mg by mouth every 6 hours as needed. He was also given a refill of tramadol. The patient would come back for followup visit in 3 weeks for evaluation and management any adverse effect of his treatment. He was advised to call immediately if he has any concerning symptoms in the interval. The patient was  advised to call immediately if he has any concerning symptoms in the interval. All questions were answered. The patient knows to call the clinic with any problems, questions or concerns. We can certainly see the patient much sooner if necessary.  Disclaimer: This note was dictated with voice recognition software. Similar sounding words can inadvertently be transcribed and may not be corrected upon review.   Eilleen Kempf., MD 01/28/2014

## 2014-01-28 NOTE — Patient Instructions (Signed)
[facility] Discharge Instructions for Patients Receiving Chemotherapy  Today you received the following chemotherapy agents Alimta and Carboplatin.  To help prevent nausea and vomiting after your treatment, we encourage you to take your nausea medication.   If you develop nausea and vomiting that is not controlled by your nausea medication, call the clinic.   BELOW ARE SYMPTOMS THAT SHOULD BE REPORTED IMMEDIATELY:  *FEVER GREATER THAN 100.5 F  *CHILLS WITH OR WITHOUT FEVER  NAUSEA AND VOMITING THAT IS NOT CONTROLLED WITH YOUR NAUSEA MEDICATION  *UNUSUAL SHORTNESS OF BREATH  *UNUSUAL BRUISING OR BLEEDING  TENDERNESS IN MOUTH AND THROAT WITH OR WITHOUT PRESENCE OF ULCERS  *URINARY PROBLEMS  *BOWEL PROBLEMS  UNUSUAL RASH Items with * indicate a potential emergency and should be followed up as soon as possible.  Feel free to call the clinic you have any questions or concerns. The clinic phone number is [phone].

## 2014-01-29 ENCOUNTER — Telehealth: Payer: Self-pay | Admitting: Internal Medicine

## 2014-01-29 NOTE — Telephone Encounter (Signed)
s.w. pt and advised on all appts....pt ok adn aware

## 2014-02-03 ENCOUNTER — Other Ambulatory Visit (HOSPITAL_BASED_OUTPATIENT_CLINIC_OR_DEPARTMENT_OTHER): Payer: Medicare Other

## 2014-02-03 DIAGNOSIS — C3412 Malignant neoplasm of upper lobe, left bronchus or lung: Secondary | ICD-10-CM

## 2014-02-03 DIAGNOSIS — C3492 Malignant neoplasm of unspecified part of left bronchus or lung: Secondary | ICD-10-CM

## 2014-02-03 LAB — COMPREHENSIVE METABOLIC PANEL (CC13)
ALT: 64 U/L — AB (ref 0–55)
ANION GAP: 9 meq/L (ref 3–11)
AST: 55 U/L — AB (ref 5–34)
Albumin: 3.1 g/dL — ABNORMAL LOW (ref 3.5–5.0)
Alkaline Phosphatase: 187 U/L — ABNORMAL HIGH (ref 40–150)
BILIRUBIN TOTAL: 0.86 mg/dL (ref 0.20–1.20)
BUN: 22.3 mg/dL (ref 7.0–26.0)
CALCIUM: 9.2 mg/dL (ref 8.4–10.4)
CO2: 22 mEq/L (ref 22–29)
Chloride: 105 mEq/L (ref 98–109)
Creatinine: 1.2 mg/dL (ref 0.7–1.3)
GLUCOSE: 113 mg/dL (ref 70–140)
POTASSIUM: 4.3 meq/L (ref 3.5–5.1)
SODIUM: 136 meq/L (ref 136–145)
TOTAL PROTEIN: 7 g/dL (ref 6.4–8.3)

## 2014-02-03 LAB — CBC WITH DIFFERENTIAL/PLATELET
BASO%: 0.8 % (ref 0.0–2.0)
Basophils Absolute: 0 10*3/uL (ref 0.0–0.1)
EOS ABS: 0.1 10*3/uL (ref 0.0–0.5)
EOS%: 1.8 % (ref 0.0–7.0)
HCT: 33.4 % — ABNORMAL LOW (ref 38.4–49.9)
HGB: 10.9 g/dL — ABNORMAL LOW (ref 13.0–17.1)
LYMPH%: 15.3 % (ref 14.0–49.0)
MCH: 29.1 pg (ref 27.2–33.4)
MCHC: 32.5 g/dL (ref 32.0–36.0)
MCV: 89.6 fL (ref 79.3–98.0)
MONO#: 0.1 10*3/uL (ref 0.1–0.9)
MONO%: 2.7 % (ref 0.0–14.0)
NEUT%: 79.4 % — ABNORMAL HIGH (ref 39.0–75.0)
NEUTROS ABS: 3.8 10*3/uL (ref 1.5–6.5)
Platelets: 222 10*3/uL (ref 140–400)
RBC: 3.73 10*6/uL — AB (ref 4.20–5.82)
RDW: 15.7 % — AB (ref 11.0–14.6)
WBC: 4.7 10*3/uL (ref 4.0–10.3)
lymph#: 0.7 10*3/uL — ABNORMAL LOW (ref 0.9–3.3)

## 2014-02-04 ENCOUNTER — Other Ambulatory Visit: Payer: Medicare Other

## 2014-02-11 ENCOUNTER — Other Ambulatory Visit (HOSPITAL_BASED_OUTPATIENT_CLINIC_OR_DEPARTMENT_OTHER): Payer: Medicare Other

## 2014-02-11 DIAGNOSIS — C3492 Malignant neoplasm of unspecified part of left bronchus or lung: Secondary | ICD-10-CM

## 2014-02-11 DIAGNOSIS — C3412 Malignant neoplasm of upper lobe, left bronchus or lung: Secondary | ICD-10-CM

## 2014-02-11 LAB — COMPREHENSIVE METABOLIC PANEL (CC13)
ALT: 41 U/L (ref 0–55)
ANION GAP: 9 meq/L (ref 3–11)
AST: 32 U/L (ref 5–34)
Albumin: 3.2 g/dL — ABNORMAL LOW (ref 3.5–5.0)
Alkaline Phosphatase: 161 U/L — ABNORMAL HIGH (ref 40–150)
BILIRUBIN TOTAL: 0.54 mg/dL (ref 0.20–1.20)
BUN: 17.4 mg/dL (ref 7.0–26.0)
CO2: 24 meq/L (ref 22–29)
Calcium: 9 mg/dL (ref 8.4–10.4)
Chloride: 104 mEq/L (ref 98–109)
Creatinine: 1.1 mg/dL (ref 0.7–1.3)
Glucose: 113 mg/dl (ref 70–140)
Potassium: 4.1 mEq/L (ref 3.5–5.1)
SODIUM: 136 meq/L (ref 136–145)
TOTAL PROTEIN: 6.9 g/dL (ref 6.4–8.3)

## 2014-02-11 LAB — CBC WITH DIFFERENTIAL/PLATELET
BASO%: 0.3 % (ref 0.0–2.0)
Basophils Absolute: 0 10*3/uL (ref 0.0–0.1)
EOS ABS: 0.1 10*3/uL (ref 0.0–0.5)
EOS%: 2 % (ref 0.0–7.0)
HCT: 30.4 % — ABNORMAL LOW (ref 38.4–49.9)
HEMOGLOBIN: 9.8 g/dL — AB (ref 13.0–17.1)
LYMPH#: 0.5 10*3/uL — AB (ref 0.9–3.3)
LYMPH%: 12.1 % — ABNORMAL LOW (ref 14.0–49.0)
MCH: 29.2 pg (ref 27.2–33.4)
MCHC: 32.4 g/dL (ref 32.0–36.0)
MCV: 90.1 fL (ref 79.3–98.0)
MONO#: 0.6 10*3/uL (ref 0.1–0.9)
MONO%: 13.3 % (ref 0.0–14.0)
NEUT%: 72.3 % (ref 39.0–75.0)
NEUTROS ABS: 3.1 10*3/uL (ref 1.5–6.5)
Platelets: 80 10*3/uL — ABNORMAL LOW (ref 140–400)
RBC: 3.38 10*6/uL — ABNORMAL LOW (ref 4.20–5.82)
RDW: 15.4 % — AB (ref 11.0–14.6)
WBC: 4.3 10*3/uL (ref 4.0–10.3)

## 2014-02-14 ENCOUNTER — Telehealth: Payer: Self-pay | Admitting: Dietician

## 2014-02-17 ENCOUNTER — Encounter: Payer: Self-pay | Admitting: Pharmacist

## 2014-02-18 ENCOUNTER — Ambulatory Visit (HOSPITAL_BASED_OUTPATIENT_CLINIC_OR_DEPARTMENT_OTHER): Payer: Medicare Other

## 2014-02-18 ENCOUNTER — Other Ambulatory Visit (HOSPITAL_BASED_OUTPATIENT_CLINIC_OR_DEPARTMENT_OTHER): Payer: Medicare Other

## 2014-02-18 ENCOUNTER — Encounter: Payer: Self-pay | Admitting: Internal Medicine

## 2014-02-18 ENCOUNTER — Other Ambulatory Visit: Payer: Self-pay | Admitting: Medical Oncology

## 2014-02-18 ENCOUNTER — Ambulatory Visit: Payer: Medicare Other

## 2014-02-18 ENCOUNTER — Ambulatory Visit (HOSPITAL_BASED_OUTPATIENT_CLINIC_OR_DEPARTMENT_OTHER): Payer: Medicare Other | Admitting: Internal Medicine

## 2014-02-18 ENCOUNTER — Other Ambulatory Visit: Payer: Medicare Other

## 2014-02-18 ENCOUNTER — Telehealth: Payer: Self-pay | Admitting: Internal Medicine

## 2014-02-18 VITALS — BP 126/53 | HR 56 | Temp 98.4°F | Resp 18 | Ht 68.0 in | Wt 170.3 lb

## 2014-02-18 DIAGNOSIS — Z5111 Encounter for antineoplastic chemotherapy: Secondary | ICD-10-CM | POA: Diagnosis not present

## 2014-02-18 DIAGNOSIS — C3412 Malignant neoplasm of upper lobe, left bronchus or lung: Secondary | ICD-10-CM

## 2014-02-18 DIAGNOSIS — C3492 Malignant neoplasm of unspecified part of left bronchus or lung: Secondary | ICD-10-CM

## 2014-02-18 DIAGNOSIS — R52 Pain, unspecified: Secondary | ICD-10-CM

## 2014-02-18 LAB — CBC WITH DIFFERENTIAL/PLATELET
BASO%: 0.5 % (ref 0.0–2.0)
Basophils Absolute: 0 10*3/uL (ref 0.0–0.1)
EOS%: 1.5 % (ref 0.0–7.0)
Eosinophils Absolute: 0.1 10*3/uL (ref 0.0–0.5)
HCT: 31.5 % — ABNORMAL LOW (ref 38.4–49.9)
HGB: 10.1 g/dL — ABNORMAL LOW (ref 13.0–17.1)
LYMPH%: 7.1 % — AB (ref 14.0–49.0)
MCH: 29.3 pg (ref 27.2–33.4)
MCHC: 32.1 g/dL (ref 32.0–36.0)
MCV: 91.4 fL (ref 79.3–98.0)
MONO#: 0.5 10*3/uL (ref 0.1–0.9)
MONO%: 9.7 % (ref 0.0–14.0)
NEUT#: 4.4 10*3/uL (ref 1.5–6.5)
NEUT%: 81.2 % — ABNORMAL HIGH (ref 39.0–75.0)
Platelets: 207 10*3/uL (ref 140–400)
RBC: 3.45 10*6/uL — ABNORMAL LOW (ref 4.20–5.82)
RDW: 17.9 % — ABNORMAL HIGH (ref 11.0–14.6)
WBC: 5.4 10*3/uL (ref 4.0–10.3)
lymph#: 0.4 10*3/uL — ABNORMAL LOW (ref 0.9–3.3)

## 2014-02-18 LAB — COMPREHENSIVE METABOLIC PANEL (CC13)
ALT: 43 U/L (ref 0–55)
AST: 43 U/L — AB (ref 5–34)
Albumin: 3.1 g/dL — ABNORMAL LOW (ref 3.5–5.0)
Alkaline Phosphatase: 188 U/L — ABNORMAL HIGH (ref 40–150)
Anion Gap: 8 mEq/L (ref 3–11)
BILIRUBIN TOTAL: 0.53 mg/dL (ref 0.20–1.20)
BUN: 16.2 mg/dL (ref 7.0–26.0)
CO2: 25 mEq/L (ref 22–29)
Calcium: 9.3 mg/dL (ref 8.4–10.4)
Chloride: 104 mEq/L (ref 98–109)
Creatinine: 1.1 mg/dL (ref 0.7–1.3)
Glucose: 217 mg/dl — ABNORMAL HIGH (ref 70–140)
Potassium: 4.3 mEq/L (ref 3.5–5.1)
Sodium: 138 mEq/L (ref 136–145)
Total Protein: 7.3 g/dL (ref 6.4–8.3)

## 2014-02-18 MED ORDER — ONDANSETRON 16 MG/50ML IVPB (CHCC)
INTRAVENOUS | Status: AC
  Filled 2014-02-18: qty 16

## 2014-02-18 MED ORDER — DEXAMETHASONE SODIUM PHOSPHATE 20 MG/5ML IJ SOLN
20.0000 mg | Freq: Once | INTRAMUSCULAR | Status: AC
  Administered 2014-02-18: 20 mg via INTRAVENOUS

## 2014-02-18 MED ORDER — ONDANSETRON 16 MG/50ML IVPB (CHCC)
16.0000 mg | Freq: Once | INTRAVENOUS | Status: AC
  Administered 2014-02-18: 16 mg via INTRAVENOUS

## 2014-02-18 MED ORDER — DEXAMETHASONE SODIUM PHOSPHATE 20 MG/5ML IJ SOLN
INTRAMUSCULAR | Status: AC
  Filled 2014-02-18: qty 5

## 2014-02-18 MED ORDER — SODIUM CHLORIDE 0.9 % IV SOLN
420.0000 mg | Freq: Once | INTRAVENOUS | Status: AC
  Administered 2014-02-18: 420 mg via INTRAVENOUS
  Filled 2014-02-18: qty 42

## 2014-02-18 MED ORDER — SODIUM CHLORIDE 0.9 % IV SOLN
500.0000 mg/m2 | Freq: Once | INTRAVENOUS | Status: AC
  Administered 2014-02-18: 975 mg via INTRAVENOUS
  Filled 2014-02-18: qty 39

## 2014-02-18 MED ORDER — SODIUM CHLORIDE 0.9 % IV SOLN
Freq: Once | INTRAVENOUS | Status: AC
  Administered 2014-02-18: 15:00:00 via INTRAVENOUS

## 2014-02-18 NOTE — Patient Instructions (Signed)
[facility] Discharge Instructions for Patients Receiving Chemotherapy  Today you received the following chemotherapy agents: alimta, carboplatin   To help prevent nausea and vomiting after your treatment, we encourage you to take your nausea medication.  Take it as often as prescribed.     If you develop nausea and vomiting that is not controlled by your nausea medication, call the clinic. If it is after clinic hours your family physician or the after hours number for the clinic or go to the Emergency Department.   BELOW ARE SYMPTOMS THAT SHOULD BE REPORTED IMMEDIATELY:  *FEVER GREATER THAN 100.5 F  *CHILLS WITH OR WITHOUT FEVER  NAUSEA AND VOMITING THAT IS NOT CONTROLLED WITH YOUR NAUSEA MEDICATION  *UNUSUAL SHORTNESS OF BREATH  *UNUSUAL BRUISING OR BLEEDING  TENDERNESS IN MOUTH AND THROAT WITH OR WITHOUT PRESENCE OF ULCERS  *URINARY PROBLEMS  *BOWEL PROBLEMS  UNUSUAL RASH Items with * indicate a potential emergency and should be followed up as soon as possible.  Feel free to call the clinic you have any questions or concerns. The clinic phone number is [phone].   I have been informed and understand all the instructions given to me. I know to contact the clinic, my physician, or go to the Emergency Department if any problems should occur. I do not have any questions at this time, but understand that I may call the clinic during office hours   should I have any questions or need assistance in obtaining follow up care.    __________________________________________  _____________  __________ Signature of Patient or Authorized Representative            Date                   Time    __________________________________________ Nurse's Signature

## 2014-02-18 NOTE — Progress Notes (Signed)
Christopher Roach  [phone] Fax:([phone]  OFFICE VISIT PROGRESS NOTE  [name], [place] [address]  DIAGNOSIS: Recurrent non-small cell lung cancer initially diagnosed as Stage IIIA (T1b, N2, M0) non-small cell lung cancer, adenocarcinoma with negative EGFR mutation and negative ALK gene translocation  PRIOR THERAPY: Concurrent chemoradiation with weekly carboplatin for an AUC of 2 and paclitaxel at 45 mg per meter squared concurrent with radiation therapy under the care of Dr. Lisbeth Renshaw. He status post 7 cycles of chemotherapy. Last dose was given 02/11/2013 with partial response.  CURRENT THERAPY: Systemic chemotherapy with carboplatin for AUC of 5 and Alimta 500 mg/M2 every 3 weeks. First dose on 01/07/2014. Status post 1 cycle.  DISEASE STAGE: Stage IIIA (T1b, N2, M0)  CHEMOTHERAPY INTENT: Palliative.  CURRENT # OF CHEMOTHERAPY CYCLES: 2  CURRENT ANTIEMETICS: Zofran, dexamethasone, and Compazine for at home use  CURRENT SMOKING STATUS: Former smoker quit 12/20/1992  ORAL CHEMOTHERAPY AND CONSENT: n/a  CURRENT BISPHOSPHONATES USE: None  PAIN MANAGEMENT: Dilaudid  NARCOTICS INDUCED CONSTIPATION: None  LIVING WILL AND CODE STATUS: ?  INTERVAL HISTORY: SLY PARLEE 80 y.o. male returns for followup visit accompanied by his son-in-law. He is feeling fine today and tolerated the first cycle of his chemotherapy with carboplatin and Alimta fairly well. He continues to complain of dry cough but no hemoptysis. He also has occasional pain on the left side of his chest.. He also has shortness of breath with exertion. The patient denied having any nausea or vomiting. He denied having any fever or chills. He is here today to start cycle #3 of his systemic chemotherapy.  MEDICAL HISTORY: Past Medical History  Diagnosis Date  . Hypertension   . Hyperlipidemia   . Hypothyroidism   . Abnormal LFTs   . Gallbladder  polyp   . BPH (benign prostatic hyperplasia)   . Actinic keratosis   . Lung nodule     benign BX 03/24/2008  . Anemia   . H/O asbestos exposure   . Pneumonia     hx of walking PNA  . Shortness of breath     with exertion  . Constipation     from medications  . Arthritis   . Kidney stone August 2014    hx of  . Birth mark     left arm and shoulder; Dark purple on upper L chest ;NOT A RASH    ALLERGIES:  is allergic to codeine.  MEDICATIONS:  Current Outpatient Prescriptions  Medication Sig Dispense Refill  . ALPRAZolam (XANAX) 0.25 MG tablet Take 1 tablet by mouth 3 (three) times daily as needed.  0  . amLODipine (NORVASC) 10 MG tablet Take 10 mg by mouth at bedtime.     Marland Kitchen aspirin EC 81 MG tablet Take 81 mg by mouth daily.    . benzonatate (TESSALON) 200 MG capsule     . dexamethasone (DECADRON) 4 MG tablet 4 mg by mouth twice a day the day before, day of and day after the chemotherapy every 3 weeks. 40 tablet 1  . folic acid (FOLVITE) 1 MG tablet Take 1 tablet (1 mg total) by mouth daily. 30 tablet 4  . levothyroxine (SYNTHROID, LEVOTHROID) 125 MCG tablet Take 125 mcg by mouth at bedtime.     . prochlorperazine (COMPAZINE) 10 MG tablet Take 1 tablet (10 mg total) by mouth every 6 (six) hours as needed for nausea or vomiting. 30 tablet 0  . rosuvastatin (CRESTOR) 10  MG tablet Take 10 mg by mouth once a week.     . tamsulosin (FLOMAX) 0.4 MG CAPS Take 0.4 mg by mouth at bedtime.     . traMADol (ULTRAM) 50 MG tablet Take 1 tablet (50 mg total) by mouth every 6 (six) hours as needed. 30 tablet 0  . traZODone (DESYREL) 50 MG tablet 1 tablet at bedtime as needed.  0   No current facility-administered medications for this visit.    SURGICAL HISTORY:  Past Surgical History  Procedure Laterality Date  . Back surgery      Dr Collie Siad 1978 and 1982  . Cervical disc surgery    . Right foot surgery      plantar fascitis  . Right shoulder surgery      torn rotator cuff  . Right ankle  fracture         . Right wrist fracture    . Foot surgery Left   . Tonsillectomy    . Appendectomy    . Colonoscopy    . Flexible bronchoscopy N/A 12/07/2012    Procedure: FLEXIBLE BRONCHOSCOPY;  Surgeon: Gaye Pollack, MD;  Location: [facility] OR;  Service: Thoracic;  Laterality: N/A;  . Video assisted thoracoscopy (vats)/thorocotomy Left 12/07/2012    Procedure: VIDEO ASSISTED THORACOSCOPY (VATS)/ EXPLORATORY THOROCOTOMY;  Surgeon: Gaye Pollack, MD;  Location: [facility] OR;  Service: Thoracic;  Laterality: Left;    REVIEW OF SYSTEMS:  Constitutional: positive for fatigue Eyes: negative Ears, nose, mouth, throat, and face: negative Respiratory: positive for cough, dyspnea on exertion and pleurisy/chest pain Cardiovascular: negative Gastrointestinal: negative Genitourinary:negative Integument/breast: negative Hematologic/lymphatic: negative Musculoskeletal:negative Neurological: negative Behavioral/Psych: negative Endocrine: negative Allergic/Immunologic: negative   PHYSICAL EXAMINATION: General appearance: alert, cooperative, appears stated age and no distress Head: Normocephalic, without obvious abnormality, atraumatic Neck: no adenopathy, no JVD, supple, symmetrical, trachea midline and thyroid not enlarged, symmetric, no tenderness/mass/nodules Lymph nodes: Cervical, supraclavicular, and axillary nodes normal. Resp: diminished breath sounds LUL and dullness to percussion LUL Cardio: regular rate and rhythm, S1, S2 normal, no murmur, click, rub or gallop GI: soft, non-tender; bowel sounds normal; no masses,  no organomegaly Extremities: extremities normal, atraumatic, no cyanosis or edema Neurologic: Alert and oriented X 3, normal strength and tone. Normal symmetric reflexes. Normal coordination and gait  ECOG PERFORMANCE STATUS: 1 - Symptomatic but completely ambulatory  Blood pressure 126/53, pulse 56, temperature 98.4 F (36.9 C), temperature source Oral, resp. rate 18, height _0   (1.727 m), weight 170 lb 4.8 oz (77.248 kg), SpO2 98 %.  LABORATORY DATA: Lab Results  Component Value Date   WBC 5.4 02/18/2014   HGB 10.1* 02/18/2014   HCT 31.5* 02/18/2014   MCV 91.4 02/18/2014   PLT 207 02/18/2014      Chemistry      Component Value Date/Time   NA 138 02/18/2014 1252   NA 135* 01/10/2014 1446   K 4.3 02/18/2014 1252   K 4.3 01/10/2014 1446   CL 98 01/10/2014 1446   CO2 25 02/18/2014 1252   CO2 24 01/10/2014 1446   BUN 16.2 02/18/2014 1252   BUN 20 01/10/2014 1446   CREATININE 1.1 02/18/2014 1252   CREATININE 0.90 01/10/2014 1446      Component Value Date/Time   CALCIUM 9.3 02/18/2014 1252   CALCIUM 8.7 01/10/2014 1446   ALKPHOS 188* 02/18/2014 1252   ALKPHOS 129* 12/05/2012 1348   AST 43* 02/18/2014 1252   AST 44* 12/05/2012 1348   ALT 43 02/18/2014 1252  ALT 35 12/05/2012 1348   BILITOT 0.53 02/18/2014 1252   BILITOT 0.5 12/05/2012 1348       RADIOGRAPHIC STUDIES:  ASSESSMENT/PLAN: This is a very pleasant 80 years old white male recently diagnosed with a stage IIIa non-small cell lung cancer currently undergoing concurrent chemoradiation with weekly carboplatin and paclitaxel status post 7 cycles.  He is currently on treatment with systemic chemotherapy with carboplatin and Alimta status post 2 cycles for recurrent disease. He tolerated the first cycle of his treatment fairly well with no significant adverse effects. I recommended for the patient to proceed with cycle #3 today as scheduled. For pain management he will continue on treatment with tramadol. For cough the patient will continue on Tessalon. The patient would come back for followup visit in 3 weeks for evaluation after repeating CT scan of the chest for reevaluation of his disease. He was advised to call immediately if he has any concerning symptoms in the interval. The patient was advised to call immediately if he has any concerning symptoms in the interval. All questions were  answered. The patient knows to call the clinic with any problems, questions or concerns. We can certainly see the patient much sooner if necessary.  Disclaimer: This note was dictated with voice recognition software. Similar sounding words can inadvertently be transcribed and may not be corrected upon review.   Eilleen Kempf., MD 02/18/2014

## 2014-02-18 NOTE — Telephone Encounter (Signed)
gv and printed appt sched adna vs for pt for NOV and DEC

## 2014-02-18 NOTE — Telephone Encounter (Signed)
medication reconciliation

## 2014-02-25 ENCOUNTER — Other Ambulatory Visit (HOSPITAL_BASED_OUTPATIENT_CLINIC_OR_DEPARTMENT_OTHER): Payer: Medicare Other

## 2014-02-25 DIAGNOSIS — C3412 Malignant neoplasm of upper lobe, left bronchus or lung: Secondary | ICD-10-CM

## 2014-02-25 DIAGNOSIS — C3492 Malignant neoplasm of unspecified part of left bronchus or lung: Secondary | ICD-10-CM

## 2014-02-25 LAB — COMPREHENSIVE METABOLIC PANEL (CC13)
ALBUMIN: 3.2 g/dL — AB (ref 3.5–5.0)
ALT: 66 U/L — ABNORMAL HIGH (ref 0–55)
AST: 54 U/L — ABNORMAL HIGH (ref 5–34)
Alkaline Phosphatase: 178 U/L — ABNORMAL HIGH (ref 40–150)
Anion Gap: 0 mEq/L — ABNORMAL LOW (ref 3–11)
BILIRUBIN TOTAL: 0.68 mg/dL (ref 0.20–1.20)
BUN: 17.9 mg/dL (ref 7.0–26.0)
CO2: 26 mEq/L (ref 22–29)
Calcium: 9.1 mg/dL (ref 8.4–10.4)
Chloride: 109 mEq/L (ref 98–109)
Creatinine: 1 mg/dL (ref 0.7–1.3)
GLUCOSE: 187 mg/dL — AB (ref 70–140)
Potassium: 4.2 mEq/L (ref 3.5–5.1)
Sodium: 135 mEq/L — ABNORMAL LOW (ref 136–145)
Total Protein: 7.1 g/dL (ref 6.4–8.3)

## 2014-02-25 LAB — CBC WITH DIFFERENTIAL/PLATELET
BASO%: 1.2 % (ref 0.0–2.0)
Basophils Absolute: 0 10*3/uL (ref 0.0–0.1)
EOS%: 2.8 % (ref 0.0–7.0)
Eosinophils Absolute: 0.1 10*3/uL (ref 0.0–0.5)
HCT: 31.8 % — ABNORMAL LOW (ref 38.4–49.9)
HGB: 10.3 g/dL — ABNORMAL LOW (ref 13.0–17.1)
LYMPH#: 0.5 10*3/uL — AB (ref 0.9–3.3)
LYMPH%: 20.2 % (ref 14.0–49.0)
MCH: 29.4 pg (ref 27.2–33.4)
MCHC: 32.4 g/dL (ref 32.0–36.0)
MCV: 90.9 fL (ref 79.3–98.0)
MONO#: 0.2 10*3/uL (ref 0.1–0.9)
MONO%: 6.5 % (ref 0.0–14.0)
NEUT#: 1.7 10*3/uL (ref 1.5–6.5)
NEUT%: 69.3 % (ref 39.0–75.0)
Platelets: 163 10*3/uL (ref 140–400)
RBC: 3.5 10*6/uL — AB (ref 4.20–5.82)
RDW: 17.6 % — ABNORMAL HIGH (ref 11.0–14.6)
WBC: 2.5 10*3/uL — ABNORMAL LOW (ref 4.0–10.3)

## 2014-03-03 ENCOUNTER — Other Ambulatory Visit: Payer: Self-pay | Admitting: *Deleted

## 2014-03-03 DIAGNOSIS — C3492 Malignant neoplasm of unspecified part of left bronchus or lung: Secondary | ICD-10-CM

## 2014-03-03 DIAGNOSIS — C3412 Malignant neoplasm of upper lobe, left bronchus or lung: Secondary | ICD-10-CM

## 2014-03-03 MED ORDER — TRAMADOL HCL 50 MG PO TABS: 50.0000 mg | ORAL_TABLET | Freq: Four times a day (QID) | ORAL | Status: DC | PRN

## 2014-03-04 ENCOUNTER — Other Ambulatory Visit (HOSPITAL_BASED_OUTPATIENT_CLINIC_OR_DEPARTMENT_OTHER): Payer: Medicare Other

## 2014-03-04 DIAGNOSIS — C3412 Malignant neoplasm of upper lobe, left bronchus or lung: Secondary | ICD-10-CM

## 2014-03-04 DIAGNOSIS — C3492 Malignant neoplasm of unspecified part of left bronchus or lung: Secondary | ICD-10-CM

## 2014-03-04 LAB — CBC WITH DIFFERENTIAL/PLATELET
BASO%: 0.4 % (ref 0.0–2.0)
BASOS ABS: 0 10*3/uL (ref 0.0–0.1)
EOS%: 1.4 % (ref 0.0–7.0)
Eosinophils Absolute: 0.1 10*3/uL (ref 0.0–0.5)
HEMATOCRIT: 30 % — AB (ref 38.4–49.9)
HEMOGLOBIN: 9.7 g/dL — AB (ref 13.0–17.1)
LYMPH%: 12.1 % — AB (ref 14.0–49.0)
MCH: 29.7 pg (ref 27.2–33.4)
MCHC: 32.3 g/dL (ref 32.0–36.0)
MCV: 92 fL (ref 79.3–98.0)
MONO#: 0.6 10*3/uL (ref 0.1–0.9)
MONO%: 13.4 % (ref 0.0–14.0)
NEUT%: 72.7 % (ref 39.0–75.0)
NEUTROS ABS: 3.5 10*3/uL (ref 1.5–6.5)
Platelets: 99 10*3/uL — ABNORMAL LOW (ref 140–400)
RBC: 3.26 10*6/uL — AB (ref 4.20–5.82)
RDW: 19.6 % — ABNORMAL HIGH (ref 11.0–14.6)
WBC: 4.8 10*3/uL (ref 4.0–10.3)
lymph#: 0.6 10*3/uL — ABNORMAL LOW (ref 0.9–3.3)

## 2014-03-04 LAB — COMPREHENSIVE METABOLIC PANEL (CC13)
ALK PHOS: 209 U/L — AB (ref 40–150)
ALT: 44 U/L (ref 0–55)
ANION GAP: 9 meq/L (ref 3–11)
AST: 37 U/L — ABNORMAL HIGH (ref 5–34)
Albumin: 3.2 g/dL — ABNORMAL LOW (ref 3.5–5.0)
BILIRUBIN TOTAL: 0.83 mg/dL (ref 0.20–1.20)
BUN: 14.4 mg/dL (ref 7.0–26.0)
CO2: 26 meq/L (ref 22–29)
Calcium: 9.2 mg/dL (ref 8.4–10.4)
Chloride: 103 mEq/L (ref 98–109)
Creatinine: 1.1 mg/dL (ref 0.7–1.3)
GLUCOSE: 158 mg/dL — AB (ref 70–140)
Potassium: 4.2 mEq/L (ref 3.5–5.1)
SODIUM: 138 meq/L (ref 136–145)
Total Protein: 7 g/dL (ref 6.4–8.3)

## 2014-03-10 ENCOUNTER — Ambulatory Visit (HOSPITAL_COMMUNITY)
Admission: RE | Admit: 2014-03-10 | Discharge: 2014-03-10 | Disposition: A | Discharge status: Home / Self Care | Payer: Medicare Other | Source: Ambulatory Visit | Attending: Internal Medicine | Admitting: Internal Medicine

## 2014-03-10 DIAGNOSIS — R05 Cough: Secondary | ICD-10-CM | POA: Diagnosis not present

## 2014-03-10 DIAGNOSIS — R0602 Shortness of breath: Secondary | ICD-10-CM | POA: Insufficient documentation

## 2014-03-10 DIAGNOSIS — J90 Pleural effusion, not elsewhere classified: Secondary | ICD-10-CM | POA: Insufficient documentation

## 2014-03-10 DIAGNOSIS — C3412 Malignant neoplasm of upper lobe, left bronchus or lung: Secondary | ICD-10-CM | POA: Insufficient documentation

## 2014-03-10 DIAGNOSIS — R590 Localized enlarged lymph nodes: Secondary | ICD-10-CM | POA: Diagnosis not present

## 2014-03-10 MED ORDER — IOHEXOL 300 MG/ML  SOLN
80.0000 mL | Freq: Once | INTRAMUSCULAR | Status: AC | PRN
  Administered 2014-03-10: 80 mL via INTRAVENOUS

## 2014-03-11 ENCOUNTER — Other Ambulatory Visit: Payer: Medicare Other

## 2014-03-11 ENCOUNTER — Ambulatory Visit: Payer: Medicare Other

## 2014-03-11 ENCOUNTER — Ambulatory Visit (HOSPITAL_BASED_OUTPATIENT_CLINIC_OR_DEPARTMENT_OTHER): Payer: Medicare Other

## 2014-03-11 ENCOUNTER — Telehealth: Payer: Self-pay | Admitting: *Deleted

## 2014-03-11 ENCOUNTER — Ambulatory Visit (HOSPITAL_BASED_OUTPATIENT_CLINIC_OR_DEPARTMENT_OTHER): Payer: Medicare Other | Admitting: Internal Medicine

## 2014-03-11 ENCOUNTER — Telehealth: Payer: Self-pay | Admitting: Internal Medicine

## 2014-03-11 ENCOUNTER — Other Ambulatory Visit (HOSPITAL_BASED_OUTPATIENT_CLINIC_OR_DEPARTMENT_OTHER): Payer: Medicare Other

## 2014-03-11 ENCOUNTER — Encounter: Payer: Self-pay | Admitting: Internal Medicine

## 2014-03-11 VITALS — BP 127/52 | HR 67 | Temp 98.2°F | Resp 18 | Ht 68.0 in | Wt 174.1 lb

## 2014-03-11 DIAGNOSIS — C3412 Malignant neoplasm of upper lobe, left bronchus or lung: Secondary | ICD-10-CM

## 2014-03-11 DIAGNOSIS — Z5111 Encounter for antineoplastic chemotherapy: Secondary | ICD-10-CM

## 2014-03-11 DIAGNOSIS — R05 Cough: Secondary | ICD-10-CM

## 2014-03-11 DIAGNOSIS — R079 Chest pain, unspecified: Secondary | ICD-10-CM

## 2014-03-11 DIAGNOSIS — C3411 Malignant neoplasm of upper lobe, right bronchus or lung: Secondary | ICD-10-CM

## 2014-03-11 LAB — CBC WITH DIFFERENTIAL/PLATELET
BASO%: 0.8 % (ref 0.0–2.0)
Basophils Absolute: 0 10*3/uL (ref 0.0–0.1)
EOS%: 2.5 % (ref 0.0–7.0)
Eosinophils Absolute: 0.2 10*3/uL (ref 0.0–0.5)
HEMATOCRIT: 30.4 % — AB (ref 38.4–49.9)
HGB: 9.8 g/dL — ABNORMAL LOW (ref 13.0–17.1)
LYMPH%: 12.5 % — ABNORMAL LOW (ref 14.0–49.0)
MCH: 30.2 pg (ref 27.2–33.4)
MCHC: 32.1 g/dL (ref 32.0–36.0)
MCV: 94.1 fL (ref 79.3–98.0)
MONO#: 0.8 10*3/uL (ref 0.1–0.9)
MONO%: 13.5 % (ref 0.0–14.0)
NEUT%: 70.7 % (ref 39.0–75.0)
NEUTROS ABS: 4.2 10*3/uL (ref 1.5–6.5)
Platelets: 190 10*3/uL (ref 140–400)
RBC: 3.23 10*6/uL — AB (ref 4.20–5.82)
RDW: 21.4 % — ABNORMAL HIGH (ref 11.0–14.6)
WBC: 6 10*3/uL (ref 4.0–10.3)
lymph#: 0.7 10*3/uL — ABNORMAL LOW (ref 0.9–3.3)

## 2014-03-11 LAB — COMPREHENSIVE METABOLIC PANEL (CC13)
ALT: 53 U/L (ref 0–55)
AST: 56 U/L — ABNORMAL HIGH (ref 5–34)
Albumin: 3 g/dL — ABNORMAL LOW (ref 3.5–5.0)
Alkaline Phosphatase: 228 U/L — ABNORMAL HIGH (ref 40–150)
Anion Gap: 9 mEq/L (ref 3–11)
BUN: 11.4 mg/dL (ref 7.0–26.0)
CALCIUM: 9.3 mg/dL (ref 8.4–10.4)
CHLORIDE: 103 meq/L (ref 98–109)
CO2: 26 mEq/L (ref 22–29)
Creatinine: 0.9 mg/dL (ref 0.7–1.3)
GLUCOSE: 149 mg/dL — AB (ref 70–140)
Potassium: 4.1 mEq/L (ref 3.5–5.1)
SODIUM: 139 meq/L (ref 136–145)
Total Bilirubin: 0.72 mg/dL (ref 0.20–1.20)
Total Protein: 6.8 g/dL (ref 6.4–8.3)

## 2014-03-11 MED ORDER — ONDANSETRON 16 MG/50ML IVPB (CHCC)
16.0000 mg | Freq: Once | INTRAVENOUS | Status: AC
  Administered 2014-03-11: 16 mg via INTRAVENOUS

## 2014-03-11 MED ORDER — CARBOPLATIN CHEMO INJECTION 600 MG/60ML
449.5000 mg | Freq: Once | INTRAVENOUS | Status: AC
  Administered 2014-03-11: 450 mg via INTRAVENOUS
  Filled 2014-03-11: qty 45

## 2014-03-11 MED ORDER — DEXAMETHASONE SODIUM PHOSPHATE 20 MG/5ML IJ SOLN
20.0000 mg | Freq: Once | INTRAMUSCULAR | Status: AC
  Administered 2014-03-11: 20 mg via INTRAVENOUS

## 2014-03-11 MED ORDER — CYANOCOBALAMIN 1000 MCG/ML IJ SOLN
INTRAMUSCULAR | Status: AC
  Filled 2014-03-11: qty 1

## 2014-03-11 MED ORDER — SODIUM CHLORIDE 0.9 % IV SOLN
Freq: Once | INTRAVENOUS | Status: AC
  Administered 2014-03-11: 13:00:00 via INTRAVENOUS

## 2014-03-11 MED ORDER — ONDANSETRON 16 MG/50ML IVPB (CHCC)
INTRAVENOUS | Status: AC
  Filled 2014-03-11: qty 16

## 2014-03-11 MED ORDER — CYANOCOBALAMIN 1000 MCG/ML IJ SOLN
1000.0000 ug | Freq: Once | INTRAMUSCULAR | Status: AC
  Administered 2014-03-11: 1000 ug via INTRAMUSCULAR

## 2014-03-11 MED ORDER — DEXAMETHASONE SODIUM PHOSPHATE 20 MG/5ML IJ SOLN
INTRAMUSCULAR | Status: AC
  Filled 2014-03-11: qty 5

## 2014-03-11 MED ORDER — SODIUM CHLORIDE 0.9 % IV SOLN
500.0000 mg/m2 | Freq: Once | INTRAVENOUS | Status: AC
  Administered 2014-03-11: 975 mg via INTRAVENOUS
  Filled 2014-03-11: qty 39

## 2014-03-11 NOTE — Telephone Encounter (Signed)
Pt confirmed labs/ov per 12/01 POF, sent msg to add chemo, gave pt AVS.... KJ

## 2014-03-11 NOTE — Progress Notes (Signed)
Christopher Roach  Telephone:(336) [phone] Fax:(336) [phone]  OFFICE VISIT PROGRESS NOTE  [name], [place] [address]  DIAGNOSIS: Recurrent non-small cell lung cancer initially diagnosed as Stage IIIA (T1b, N2, M0) non-small cell lung cancer, adenocarcinoma with negative EGFR mutation and negative ALK gene translocation  PRIOR THERAPY: Concurrent chemoradiation with weekly carboplatin for an AUC of 2 and paclitaxel at 45 mg per meter squared concurrent with radiation therapy under the care of Dr. Lisbeth Renshaw. He status post 7 cycles of chemotherapy. Last dose was given 02/11/2013 with partial response.  CURRENT THERAPY: Systemic chemotherapy with carboplatin for AUC of 5 and Alimta 500 mg/M2 every 3 weeks. First dose on 01/07/2014. Status post 3 cycles.  DISEASE STAGE: Stage IIIA (T1b, N2, M0)  CHEMOTHERAPY INTENT: Palliative.  CURRENT # OF CHEMOTHERAPY CYCLES: 4  CURRENT ANTIEMETICS: Zofran, dexamethasone, and Compazine for at home use  CURRENT SMOKING STATUS: Former smoker quit 12/20/1992  ORAL CHEMOTHERAPY AND CONSENT: n/a  CURRENT BISPHOSPHONATES USE: None  PAIN MANAGEMENT: Dilaudid  NARCOTICS INDUCED CONSTIPATION: None  LIVING WILL AND CODE STATUS: ?  INTERVAL HISTORY: Christopher Roach 80 y.o. male returns for followup visit accompanied by his daughters. He is feeling fine today and tolerated his chemotherapy with carboplatin and Alimta fairly well. He continues to complain of dry cough but no hemoptysis. He also has occasional pain on the left side of his chest but significantly improved compared to a few weeks ago. He also has shortness of breath with exertion. The patient denied having any nausea or vomiting. He denied having any fever or chills. He had repeat CT scan of the chest performed recently and is here for evaluation and discussion of his scan results. MEDICAL HISTORY: Past Medical History  Diagnosis Date  .  Hypertension   . Hyperlipidemia   . Hypothyroidism   . Abnormal LFTs   . Gallbladder polyp   . BPH (benign prostatic hyperplasia)   . Actinic keratosis   . Lung nodule     benign BX 03/24/2008  . Anemia   . H/O asbestos exposure   . Pneumonia     hx of walking PNA  . Shortness of breath     with exertion  . Constipation     from medications  . Arthritis   . Kidney stone August 2014    hx of  . Birth mark     left arm and shoulder; Dark purple on upper L chest ;NOT A RASH    ALLERGIES:  is allergic to codeine.  MEDICATIONS:  Current Outpatient Prescriptions  Medication Sig Dispense Refill  . amLODipine (NORVASC) 10 MG tablet Take 10 mg by mouth at bedtime.     Marland Kitchen aspirin EC 81 MG tablet Take 81 mg by mouth daily.    . benzonatate (TESSALON) 200 MG capsule     . chlorproMAZINE (THORAZINE) 25 MG tablet Take 25 mg by mouth 3 (three) times daily as needed.    Marland Kitchen dexamethasone (DECADRON) 4 MG tablet 4 mg by mouth twice a day the day before, day of and day after the chemotherapy every 3 weeks. 40 tablet 1  . Eszopiclone (ESZOPICLONE) 3 MG TABS Take 3 mg by mouth at bedtime. Take immediately before bedtime    . folic acid (FOLVITE) 1 MG tablet Take 1 tablet (1 mg total) by mouth daily. 30 tablet 4  . levothyroxine (SYNTHROID, LEVOTHROID) 125 MCG tablet Take 125 mcg by mouth at bedtime.     Marland Kitchen  prochlorperazine (COMPAZINE) 10 MG tablet Take 1 tablet (10 mg total) by mouth every 6 (six) hours as needed for nausea or vomiting. 30 tablet 0  . rosuvastatin (CRESTOR) 10 MG tablet Take 10 mg by mouth once a week.     . tamsulosin (FLOMAX) 0.4 MG CAPS Take 0.4 mg by mouth at bedtime.     . traMADol (ULTRAM) 50 MG tablet Take 1 tablet (50 mg total) by mouth every 6 (six) hours as needed. 30 tablet 0  . traZODone (DESYREL) 50 MG tablet 1 tablet at bedtime as needed.  0   No current facility-administered medications for this visit.    SURGICAL HISTORY:  Past Surgical History  Procedure  Laterality Date  . Back surgery      Dr Collie Siad 1978 and 1982  . Cervical disc surgery    . Right foot surgery      plantar fascitis  . Right shoulder surgery      torn rotator cuff  . Right ankle fracture         . Right wrist fracture    . Foot surgery Left   . Tonsillectomy    . Appendectomy    . Colonoscopy    . Flexible bronchoscopy N/A 12/07/2012    Procedure: FLEXIBLE BRONCHOSCOPY;  Surgeon: Gaye Pollack, MD;  Location: [facility] OR;  Service: Thoracic;  Laterality: N/A;  . Video assisted thoracoscopy (vats)/thorocotomy Left 12/07/2012    Procedure: VIDEO ASSISTED THORACOSCOPY (VATS)/ EXPLORATORY THOROCOTOMY;  Surgeon: Gaye Pollack, MD;  Location: [facility] OR;  Service: Thoracic;  Laterality: Left;    REVIEW OF SYSTEMS:  Constitutional: positive for fatigue Eyes: negative Ears, nose, mouth, throat, and face: negative Respiratory: positive for cough, dyspnea on exertion and pleurisy/chest pain Cardiovascular: negative Gastrointestinal: negative Genitourinary:negative Integument/breast: negative Hematologic/lymphatic: negative Musculoskeletal:negative Neurological: negative Behavioral/Psych: negative Endocrine: negative Allergic/Immunologic: negative   PHYSICAL EXAMINATION: General appearance: alert, cooperative, appears stated age and no distress Head: Normocephalic, without obvious abnormality, atraumatic Neck: no adenopathy, no JVD, supple, symmetrical, trachea midline and thyroid not enlarged, symmetric, no tenderness/mass/nodules Lymph nodes: Cervical, supraclavicular, and axillary nodes normal. Resp: diminished breath sounds LUL and dullness to percussion LUL Cardio: regular rate and rhythm, S1, S2 normal, no murmur, click, rub or gallop GI: soft, non-tender; bowel sounds normal; no masses,  no organomegaly Extremities: extremities normal, atraumatic, no cyanosis or edema Neurologic: Alert and oriented X 3, normal strength and tone. Normal symmetric reflexes. Normal  coordination and gait  ECOG PERFORMANCE STATUS: 1 - Symptomatic but completely ambulatory  Blood pressure 127/52, pulse 67, temperature 98.2 F (36.8 C), temperature source Oral, resp. rate 18, height $RemoveBe'5\' 8"'jAaMwvgbO$  (1.727 m), weight 174 lb 1.6 oz (78.971 kg).  LABORATORY DATA: Lab Results  Component Value Date   WBC 6.0 03/11/2014   HGB 9.8* 03/11/2014   HCT 30.4* 03/11/2014   MCV 94.1 03/11/2014   PLT 190 03/11/2014      Chemistry      Component Value Date/Time   NA 139 03/11/2014 1032   NA 135* 01/10/2014 1446   K 4.1 03/11/2014 1032   K 4.3 01/10/2014 1446   CL 98 01/10/2014 1446   CO2 26 03/11/2014 1032   CO2 24 01/10/2014 1446   BUN 11.4 03/11/2014 1032   BUN 20 01/10/2014 1446   CREATININE 0.9 03/11/2014 1032   CREATININE 0.90 01/10/2014 1446      Component Value Date/Time   CALCIUM 9.3 03/11/2014 1032   CALCIUM 8.7 01/10/2014 1446   ALKPHOS  228* 03/11/2014 1032   ALKPHOS 129* 12/05/2012 1348   AST 56* 03/11/2014 1032   AST 44* 12/05/2012 1348   ALT 53 03/11/2014 1032   ALT 35 12/05/2012 1348   BILITOT 0.72 03/11/2014 1032   BILITOT 0.5 12/05/2012 1348       RADIOGRAPHIC STUDIES: Ct Chest W Contrast  03/10/2014   CLINICAL DATA:  Restaging left lung cancer diagnosed 11/2012, chemotherapy and XRT complete, left chest pain, cough, shortness of breath.  EXAM: CT CHEST WITH CONTRAST  TECHNIQUE: Multidetector CT imaging of the chest was performed during intravenous contrast administration.  CONTRAST:  105mL OMNIPAQUE IOHEXOL 300 MG/ML  SOLN  COMPARISON:  PET-CT dated 01/01/2014  FINDINGS: Mass-like opacity in the left upper lobe, measuring 6.7 x 3.3 cm (series 2/image 20), previously 3.9 x 7.2 cm. Additional tumor along the lateral left upper lobe/lingula measures 4.5 x 3.0 cm (series 2/image 26), previously 5.4 x 3.7 cm. Tumor extends to the left perihilar region (series 2/image 28).  Additional tumor versus rounded atelectasis along the subpleural left lower lobe measures  2.7 x 1.5 cm (series 2/image 40), previously 2.9 x 1.5 cm, grossly unchanged.  Trace left pleural effusion. Calcified pleural plaques. No pneumothorax.  Right lung is clear. Calcified pleural plaques. No pleural effusion or pneumothorax.  Visualized thyroid is unremarkable.  Heart is top-normal in size with leftward cardiomediastinal shift. No pericardial effusion. Coronary atherosclerosis. Atherosclerotic calcifications of the aortic arch.  Thoracic lymphadenopathy, mildly improved,, including:  --11 mm short axis left supraclavicular node (series 2/image 9), previously 14 mm  --7 mm short axis left paratracheal node (series 2/image 16), previously 10 mm  --11 mm short axis left prevascular node (series 2/image 24), unchanged  --9 mm short axis AP window node (series 2/image 24), previously 11 mm  --8 mm short axis subcarinal node (series 2/ image 31), previously 10 mm  Visualized upper abdomen is notable for cirrhosis, splenomegaly, vascular calcifications, and a suspected left adrenal adenoma.  Mild degenerative changes of the visualized thoracolumbar spine. Stable mild inferior endplate deformity at T8 (sagittal image 56).  IMPRESSION: Extensive tumor in the left upper lobe extending to the left perihilar region, decreased.  Rounded atelectasis versus tumor along the left lower lobe, unchanged. Trace left pleural effusion.  Thoracic lymphadenopathy, as described above, mildly decreased.  Additional stable ancillary findings as above.   Electronically Signed   By: Julian Hy M.D.   On: 03/10/2014 16:57   ASSESSMENT/PLAN: This is a very pleasant 80 years old white male recently diagnosed with a stage IIIa non-small cell lung cancer currently undergoing concurrent chemoradiation with weekly carboplatin and paclitaxel status post 7 cycles.  He is currently on treatment with systemic chemotherapy with carboplatin and Alimta status post 3 cycles for recurrent disease. He tolerated the first cycle of his  treatment fairly well with no significant adverse effects. The recent CT scan of the chest showed improvement in his disease. I discussed the scan results and showed the images to the patient and his family. I recommended for the patient to proceed with cycle #3 of his chemotherapy was carboplatin and Alimta today as scheduled. For pain management he will continue on treatment with tramadol. For cough the patient will continue on Tessalon. He will come back for follow-up visit in 3 weeks with the start of cycle #5. We will also previously discussed the role of immunotherapy in treatment of lung cancer and the patient was informed that it is approved for second line therapy if  he has failure of the first-line treatment was carboplatin and Alimta. He was advised to call immediately if he has any concerning symptoms in the interval. The patient was advised to call immediately if he has any concerning symptoms in the interval. All questions were answered. The patient knows to call the clinic with any problems, questions or concerns. We can certainly see the patient much sooner if necessary.  Disclaimer: This note was dictated with voice recognition software. Similar sounding words can inadvertently be transcribed and may not be corrected upon review.   Eilleen Kempf., MD 03/11/2014

## 2014-03-11 NOTE — Telephone Encounter (Signed)
Per staff message and POF I have scheduled appts. Advised scheduler of appts. JMW  

## 2014-03-11 NOTE — Patient Instructions (Signed)
[facility] Discharge Instructions for Patients Receiving Chemotherapy  Today you received the following chemotherapy agents Carboplatin,vp-16 To help prevent nausea and vomiting after your treatment, we encourage you to take your nausea medication as tolerated.   If you develop nausea and vomiting that is not controlled by your nausea medication, call the clinic.   BELOW ARE SYMPTOMS THAT SHOULD BE REPORTED IMMEDIATELY:  *FEVER GREATER THAN 100.5 F  *CHILLS WITH OR WITHOUT FEVER  NAUSEA AND VOMITING THAT IS NOT CONTROLLED WITH YOUR NAUSEA MEDICATION  *UNUSUAL SHORTNESS OF BREATH  *UNUSUAL BRUISING OR BLEEDING  TENDERNESS IN MOUTH AND THROAT WITH OR WITHOUT PRESENCE OF ULCERS  *URINARY PROBLEMS  *BOWEL PROBLEMS  UNUSUAL RASH Items with * indicate a potential emergency and should be followed up as soon as possible.  Feel free to call the clinic you have any questions or concerns. The clinic phone number is [phone].

## 2014-03-18 ENCOUNTER — Other Ambulatory Visit (HOSPITAL_BASED_OUTPATIENT_CLINIC_OR_DEPARTMENT_OTHER): Payer: Medicare Other

## 2014-03-18 DIAGNOSIS — C3492 Malignant neoplasm of unspecified part of left bronchus or lung: Secondary | ICD-10-CM

## 2014-03-18 DIAGNOSIS — C3412 Malignant neoplasm of upper lobe, left bronchus or lung: Secondary | ICD-10-CM

## 2014-03-18 LAB — COMPREHENSIVE METABOLIC PANEL (CC13)
ALT: 89 U/L — ABNORMAL HIGH (ref 0–55)
ANION GAP: 9 meq/L (ref 3–11)
AST: 88 U/L — AB (ref 5–34)
Albumin: 3.1 g/dL — ABNORMAL LOW (ref 3.5–5.0)
Alkaline Phosphatase: 246 U/L — ABNORMAL HIGH (ref 40–150)
BUN: 17.5 mg/dL (ref 7.0–26.0)
CO2: 27 meq/L (ref 22–29)
Calcium: 9.4 mg/dL (ref 8.4–10.4)
Chloride: 103 mEq/L (ref 98–109)
Creatinine: 1.2 mg/dL (ref 0.7–1.3)
EGFR: 58 mL/min/{1.73_m2} — ABNORMAL LOW (ref >90)
Glucose: 118 mg/dl (ref 70–140)
Potassium: 4.9 mEq/L (ref 3.5–5.1)
SODIUM: 140 meq/L (ref 136–145)
TOTAL PROTEIN: 7 g/dL (ref 6.4–8.3)
Total Bilirubin: 0.86 mg/dL (ref 0.20–1.20)

## 2014-03-18 LAB — CBC WITH DIFFERENTIAL/PLATELET
BASO%: 1 % (ref 0.0–2.0)
BASOS ABS: 0 10*3/uL (ref 0.0–0.1)
EOS%: 1.7 % (ref 0.0–7.0)
Eosinophils Absolute: 0.1 10*3/uL (ref 0.0–0.5)
HEMATOCRIT: 29.7 % — AB (ref 38.4–49.9)
HEMOGLOBIN: 9.6 g/dL — AB (ref 13.0–17.1)
LYMPH%: 18.5 % (ref 14.0–49.0)
MCH: 30.1 pg (ref 27.2–33.4)
MCHC: 32.3 g/dL (ref 32.0–36.0)
MCV: 93.1 fL (ref 79.3–98.0)
MONO#: 0.3 10*3/uL (ref 0.1–0.9)
MONO%: 11.9 % (ref 0.0–14.0)
NEUT#: 1.9 10*3/uL (ref 1.5–6.5)
NEUT%: 66.9 % (ref 39.0–75.0)
PLATELETS: 137 10*3/uL — AB (ref 140–400)
RBC: 3.19 10*6/uL — ABNORMAL LOW (ref 4.20–5.82)
RDW: 19.3 % — AB (ref 11.0–14.6)
WBC: 2.9 10*3/uL — ABNORMAL LOW (ref 4.0–10.3)
lymph#: 0.5 10*3/uL — ABNORMAL LOW (ref 0.9–3.3)

## 2014-03-25 ENCOUNTER — Other Ambulatory Visit (HOSPITAL_BASED_OUTPATIENT_CLINIC_OR_DEPARTMENT_OTHER): Payer: Medicare Other

## 2014-03-25 DIAGNOSIS — C3492 Malignant neoplasm of unspecified part of left bronchus or lung: Secondary | ICD-10-CM

## 2014-03-25 DIAGNOSIS — C3412 Malignant neoplasm of upper lobe, left bronchus or lung: Secondary | ICD-10-CM

## 2014-03-25 LAB — CBC WITH DIFFERENTIAL/PLATELET
BASO%: 0.3 % (ref 0.0–2.0)
BASOS ABS: 0 10*3/uL (ref 0.0–0.1)
EOS%: 1.1 % (ref 0.0–7.0)
Eosinophils Absolute: 0.1 10*3/uL (ref 0.0–0.5)
HCT: 29.2 % — ABNORMAL LOW (ref 38.4–49.9)
HEMOGLOBIN: 9.3 g/dL — AB (ref 13.0–17.1)
LYMPH%: 8.5 % — ABNORMAL LOW (ref 14.0–49.0)
MCH: 30.7 pg (ref 27.2–33.4)
MCHC: 32 g/dL (ref 32.0–36.0)
MCV: 96 fL (ref 79.3–98.0)
MONO#: 0.7 10*3/uL (ref 0.1–0.9)
MONO%: 10.8 % (ref 0.0–14.0)
NEUT#: 5.2 10*3/uL (ref 1.5–6.5)
NEUT%: 79.3 % — AB (ref 39.0–75.0)
Platelets: 90 10*3/uL — ABNORMAL LOW (ref 140–400)
RBC: 3.04 10*6/uL — ABNORMAL LOW (ref 4.20–5.82)
RDW: 21.7 % — ABNORMAL HIGH (ref 11.0–14.6)
WBC: 6.6 10*3/uL (ref 4.0–10.3)
lymph#: 0.6 10*3/uL — ABNORMAL LOW (ref 0.9–3.3)

## 2014-03-25 LAB — COMPREHENSIVE METABOLIC PANEL (CC13)
ALBUMIN: 3.1 g/dL — AB (ref 3.5–5.0)
ALK PHOS: 205 U/L — AB (ref 40–150)
ALT: 43 U/L (ref 0–55)
AST: 40 U/L — AB (ref 5–34)
Anion Gap: 11 mEq/L (ref 3–11)
BUN: 12 mg/dL (ref 7.0–26.0)
CO2: 25 mEq/L (ref 22–29)
CREATININE: 1 mg/dL (ref 0.7–1.3)
Calcium: 9 mg/dL (ref 8.4–10.4)
Chloride: 104 mEq/L (ref 98–109)
EGFR: 68 mL/min/{1.73_m2} — AB (ref >90)
GLUCOSE: 148 mg/dL — AB (ref 70–140)
POTASSIUM: 4.7 meq/L (ref 3.5–5.1)
Sodium: 140 mEq/L (ref 136–145)
Total Bilirubin: 0.67 mg/dL (ref 0.20–1.20)
Total Protein: 7.1 g/dL (ref 6.4–8.3)

## 2014-04-01 ENCOUNTER — Ambulatory Visit (HOSPITAL_BASED_OUTPATIENT_CLINIC_OR_DEPARTMENT_OTHER): Payer: Medicare Other | Admitting: Physician Assistant

## 2014-04-01 ENCOUNTER — Ambulatory Visit (HOSPITAL_BASED_OUTPATIENT_CLINIC_OR_DEPARTMENT_OTHER): Payer: Medicare Other

## 2014-04-01 ENCOUNTER — Encounter: Payer: Self-pay | Admitting: Physician Assistant

## 2014-04-01 ENCOUNTER — Other Ambulatory Visit: Payer: Medicare Other

## 2014-04-01 ENCOUNTER — Ambulatory Visit (HOSPITAL_BASED_OUTPATIENT_CLINIC_OR_DEPARTMENT_OTHER): Payer: Medicare Other | Admitting: Lab

## 2014-04-01 VITALS — BP 129/63 | HR 88 | Temp 97.9°F | Resp 18 | Ht 68.0 in | Wt 169.3 lb

## 2014-04-01 DIAGNOSIS — C3412 Malignant neoplasm of upper lobe, left bronchus or lung: Secondary | ICD-10-CM

## 2014-04-01 DIAGNOSIS — R05 Cough: Secondary | ICD-10-CM

## 2014-04-01 DIAGNOSIS — C3492 Malignant neoplasm of unspecified part of left bronchus or lung: Secondary | ICD-10-CM

## 2014-04-01 DIAGNOSIS — B379 Candidiasis, unspecified: Secondary | ICD-10-CM

## 2014-04-01 DIAGNOSIS — Z5111 Encounter for antineoplastic chemotherapy: Secondary | ICD-10-CM

## 2014-04-01 DIAGNOSIS — R079 Chest pain, unspecified: Secondary | ICD-10-CM

## 2014-04-01 LAB — COMPREHENSIVE METABOLIC PANEL (CC13)
ALBUMIN: 3.2 g/dL — AB (ref 3.5–5.0)
ALT: 27 U/L (ref 0–55)
ANION GAP: 11 meq/L (ref 3–11)
AST: 29 U/L (ref 5–34)
Alkaline Phosphatase: 205 U/L — ABNORMAL HIGH (ref 40–150)
BILIRUBIN TOTAL: 0.59 mg/dL (ref 0.20–1.20)
BUN: 16.2 mg/dL (ref 7.0–26.0)
CO2: 26 mEq/L (ref 22–29)
Calcium: 9.8 mg/dL (ref 8.4–10.4)
Chloride: 101 mEq/L (ref 98–109)
Creatinine: 1.1 mg/dL (ref 0.7–1.3)
EGFR: 60 mL/min/{1.73_m2} — ABNORMAL LOW (ref >90)
GLUCOSE: 119 mg/dL (ref 70–140)
POTASSIUM: 4.4 meq/L (ref 3.5–5.1)
Sodium: 137 mEq/L (ref 136–145)
TOTAL PROTEIN: 7.4 g/dL (ref 6.4–8.3)

## 2014-04-01 LAB — CBC WITH DIFFERENTIAL/PLATELET
BASO%: 0.8 % (ref 0.0–2.0)
BASOS ABS: 0.1 10*3/uL (ref 0.0–0.1)
EOS%: 1.5 % (ref 0.0–7.0)
Eosinophils Absolute: 0.1 10*3/uL (ref 0.0–0.5)
HEMATOCRIT: 28.9 % — AB (ref 38.4–49.9)
HEMOGLOBIN: 9.4 g/dL — AB (ref 13.0–17.1)
LYMPH#: 0.8 10*3/uL — AB (ref 0.9–3.3)
LYMPH%: 13.4 % — ABNORMAL LOW (ref 14.0–49.0)
MCH: 31.4 pg (ref 27.2–33.4)
MCHC: 32.5 g/dL (ref 32.0–36.0)
MCV: 96.5 fL (ref 79.3–98.0)
MONO#: 1.1 10*3/uL — ABNORMAL HIGH (ref 0.1–0.9)
MONO%: 17.4 % — ABNORMAL HIGH (ref 0.0–14.0)
NEUT#: 4.2 10*3/uL (ref 1.5–6.5)
NEUT%: 66.9 % (ref 39.0–75.0)
PLATELETS: 221 10*3/uL (ref 140–400)
RBC: 2.99 10*6/uL — ABNORMAL LOW (ref 4.20–5.82)
RDW: 21.2 % — ABNORMAL HIGH (ref 11.0–14.6)
WBC: 6.3 10*3/uL (ref 4.0–10.3)

## 2014-04-01 MED ORDER — SODIUM CHLORIDE 0.9 % IV SOLN
Freq: Once | INTRAVENOUS | Status: AC
  Administered 2014-04-01: 15:00:00 via INTRAVENOUS

## 2014-04-01 MED ORDER — CARBOPLATIN CHEMO INTRADERMAL TEST DOSE 100MCG/0.02ML
100.0000 ug | Freq: Once | INTRADERMAL | Status: AC
  Administered 2014-04-01: 100 ug via INTRADERMAL
  Filled 2014-04-01: qty 0.01

## 2014-04-01 MED ORDER — DEXAMETHASONE SODIUM PHOSPHATE 20 MG/5ML IJ SOLN
20.0000 mg | Freq: Once | INTRAMUSCULAR | Status: AC
  Administered 2014-04-01: 20 mg via INTRAVENOUS

## 2014-04-01 MED ORDER — SODIUM CHLORIDE 0.9 % IV SOLN
420.0000 mg | Freq: Once | INTRAVENOUS | Status: AC
  Administered 2014-04-01: 420 mg via INTRAVENOUS
  Filled 2014-04-01: qty 42

## 2014-04-01 MED ORDER — SODIUM CHLORIDE 0.9 % IV SOLN
500.0000 mg/m2 | Freq: Once | INTRAVENOUS | Status: AC
  Administered 2014-04-01: 975 mg via INTRAVENOUS
  Filled 2014-04-01: qty 39

## 2014-04-01 MED ORDER — ONDANSETRON 16 MG/50ML IVPB (CHCC)
INTRAVENOUS | Status: AC
  Filled 2014-04-01: qty 16

## 2014-04-01 MED ORDER — FLUCONAZOLE 100 MG PO TABS: 100.0000 mg | ORAL_TABLET | Freq: Every day | ORAL | Status: DC

## 2014-04-01 MED ORDER — DEXAMETHASONE SODIUM PHOSPHATE 20 MG/5ML IJ SOLN
INTRAMUSCULAR | Status: AC
  Filled 2014-04-01: qty 5

## 2014-04-01 MED ORDER — ONDANSETRON 16 MG/50ML IVPB (CHCC)
16.0000 mg | Freq: Once | INTRAVENOUS | Status: AC
  Administered 2014-04-01: 16 mg via INTRAVENOUS

## 2014-04-01 NOTE — Patient Instructions (Signed)
Take the Diflucan as prescribed to treat the thrush Continue labs and chemotherapy as scheduled Follow-up in 3 weeks prior to your next scheduled cycle of chemotherapy

## 2014-04-01 NOTE — Patient Instructions (Signed)
[facility] Discharge Instructions for Patients Receiving Chemotherapy  Today you received the following chemotherapy agents Alimta/Carboplatin.  To help prevent nausea and vomiting after your treatment, we encourage you to take your nausea medication as directed.    If you develop nausea and vomiting that is not controlled by your nausea medication, call the clinic.   BELOW ARE SYMPTOMS THAT SHOULD BE REPORTED IMMEDIATELY:  *FEVER GREATER THAN 100.5 F  *CHILLS WITH OR WITHOUT FEVER  NAUSEA AND VOMITING THAT IS NOT CONTROLLED WITH YOUR NAUSEA MEDICATION  *UNUSUAL SHORTNESS OF BREATH  *UNUSUAL BRUISING OR BLEEDING  TENDERNESS IN MOUTH AND THROAT WITH OR WITHOUT PRESENCE OF ULCERS  *URINARY PROBLEMS  *BOWEL PROBLEMS  UNUSUAL RASH Items with * indicate a potential emergency and should be followed up as soon as possible.  Feel free to call the clinic you have any questions or concerns. The clinic phone number is [phone].

## 2014-04-01 NOTE — Progress Notes (Addendum)
[facility]  [phone] Fax:([phone]  OFFICE VISIT PROGRESS NOTE  [name], [place] [address]  DIAGNOSIS: Recurrent non-small cell lung cancer initially diagnosed as Stage IIIA (T1b, N2, M0) non-small cell lung cancer, adenocarcinoma with negative EGFR mutation and negative ALK gene translocation  PRIOR THERAPY: Concurrent chemoradiation with weekly carboplatin for an AUC of 2 and paclitaxel at 45 mg per meter squared concurrent with radiation therapy under the care of Dr. Lisbeth Renshaw. He status post 7 cycles of chemotherapy. Last dose was given 02/11/2013 with partial response.  CURRENT THERAPY: Systemic chemotherapy with carboplatin for AUC of 5 and Alimta 500 mg/M2 every 3 weeks. First dose on 01/07/2014. Status post 4 cycles.  DISEASE STAGE: Stage IIIA (T1b, N2, M0)  CHEMOTHERAPY INTENT: Palliative.  CURRENT # OF CHEMOTHERAPY CYCLES: 5  CURRENT ANTIEMETICS: Zofran, dexamethasone, and Compazine for at home use  CURRENT SMOKING STATUS: Former smoker quit 12/20/1992  ORAL CHEMOTHERAPY AND CONSENT: n/a  CURRENT BISPHOSPHONATES USE: None  PAIN MANAGEMENT: Dilaudid  NARCOTICS INDUCED CONSTIPATION: None  LIVING WILL AND CODE STATUS: ?  INTERVAL HISTORY: Christopher Roach 80 y.o. male returns for followup visit unaccompanied. Overall he is been tolerating his chemotherapy with carboplatin and Alimta relatively well with the exception of decreased appetite and feeling as though he has no energy. He also reports occasional constipation. He continues to complain of dry cough but no hemoptysis. He also has occasional pain on the left side of his chest but significantly improved compared to a few weeks ago. He also has shortness of breath with exertion. The patient denied having any nausea or vomiting. He denied having any fever or chills. He continues to complain of left rib cage pain. He states the 50 mg Ultram made him  shaky to the point where he couldn't hold things. He presents to proceed with cycle #5 of his chemotherapy.  MEDICAL HISTORY: Past Medical History  Diagnosis Date  . Hypertension   . Hyperlipidemia   . Hypothyroidism   . Abnormal LFTs   . Gallbladder polyp   . BPH (benign prostatic hyperplasia)   . Actinic keratosis   . Lung nodule     benign BX 03/24/2008  . Anemia   . H/O asbestos exposure   . Pneumonia     hx of walking PNA  . Shortness of breath     with exertion  . Constipation     from medications  . Arthritis   . Kidney stone August 2014    hx of  . Birth mark     left arm and shoulder; Dark purple on upper L chest ;NOT A RASH    ALLERGIES:  is allergic to codeine.  MEDICATIONS:  Current Outpatient Prescriptions  Medication Sig Dispense Refill  . ALPRAZolam (XANAX) 1 MG tablet   0  . amLODipine (NORVASC) 10 MG tablet Take 10 mg by mouth at bedtime.     Marland Kitchen aspirin EC 81 MG tablet Take 81 mg by mouth daily.    . benzonatate (TESSALON) 200 MG capsule     . chlorproMAZINE (THORAZINE) 25 MG tablet Take 25 mg by mouth 3 (three) times daily as needed.    Marland Kitchen dexamethasone (DECADRON) 4 MG tablet 4 mg by mouth twice a day the day before, day of and day after the chemotherapy every 3 weeks. 40 tablet 1  . Eszopiclone (ESZOPICLONE) 3 MG TABS Take 3 mg by mouth at bedtime. Take immediately before bedtime    .  folic acid (FOLVITE) 1 MG tablet Take 1 tablet (1 mg total) by mouth daily. 30 tablet 4  . levothyroxine (SYNTHROID, LEVOTHROID) 125 MCG tablet Take 125 mcg by mouth at bedtime.     . prochlorperazine (COMPAZINE) 10 MG tablet Take 1 tablet (10 mg total) by mouth every 6 (six) hours as needed for nausea or vomiting. 30 tablet 0  . tamsulosin (FLOMAX) 0.4 MG CAPS Take 0.4 mg by mouth at bedtime.     . traMADol (ULTRAM) 50 MG tablet Take 1 tablet (50 mg total) by mouth every 6 (six) hours as needed. 30 tablet 0  . traZODone (DESYREL) 50 MG tablet 1 tablet at bedtime as needed.   0  . fluconazole (DIFLUCAN) 100 MG tablet Take 1 tablet (100 mg total) by mouth daily. 10 tablet 0  . rosuvastatin (CRESTOR) 10 MG tablet Take 10 mg by mouth once a week.      No current facility-administered medications for this visit.   Facility-Administered Medications Ordered in Other Visits  Medication Dose Route Frequency Provider Last Rate Last Dose  . 0.9 %  sodium chloride infusion   Intravenous Once Curt Bears, MD      . CARBOplatin (PARAPLATIN) 420 mg in sodium chloride 0.9 % 250 mL chemo infusion  420 mg Intravenous Once Curt Bears, MD      . PEMEtrexed (ALIMTA) 975 mg in sodium chloride 0.9 % 100 mL chemo infusion  500 mg/m2 (Treatment Plan Actual) Intravenous Once Curt Bears, MD        SURGICAL HISTORY:  Past Surgical History  Procedure Laterality Date  . Back surgery      Dr Collie Siad 1978 and 1982  . Cervical disc surgery    . Right foot surgery      plantar fascitis  . Right shoulder surgery      torn rotator cuff  . Right ankle fracture         . Right wrist fracture    . Foot surgery Left   . Tonsillectomy    . Appendectomy    . Colonoscopy    . Flexible bronchoscopy N/A 12/07/2012    Procedure: FLEXIBLE BRONCHOSCOPY;  Surgeon: Gaye Pollack, MD;  Location: [facility] OR;  Service: Thoracic;  Laterality: N/A;  . Video assisted thoracoscopy (vats)/thorocotomy Left 12/07/2012    Procedure: VIDEO ASSISTED THORACOSCOPY (VATS)/ EXPLORATORY THOROCOTOMY;  Surgeon: Gaye Pollack, MD;  Location: [facility] OR;  Service: Thoracic;  Laterality: Left;    REVIEW OF SYSTEMS:  Constitutional: positive for anorexia, fatigue and malaise Eyes: negative Ears, nose, mouth, throat, and face: negative Respiratory: positive for cough, dyspnea on exertion and pleurisy/chest pain Cardiovascular: negative Gastrointestinal: negative Genitourinary:negative Integument/breast: negative Hematologic/lymphatic: negative Musculoskeletal:negative Neurological: negative Behavioral/Psych:  negative Endocrine: negative Allergic/Immunologic: negative   PHYSICAL EXAMINATION: General appearance: alert, cooperative, appears stated age and no distress Head: Normocephalic, without obvious abnormality, atraumatic Neck: no adenopathy, no JVD, supple, symmetrical, trachea midline and thyroid not enlarged, symmetric, no tenderness/mass/nodules Lymph nodes: Cervical, supraclavicular, and axillary nodes normal. Resp: diminished breath sounds LUL and dullness to percussion LUL Cardio: regular rate and rhythm, S1, S2 normal, no murmur, click, rub or gallop GI: soft, non-tender; bowel sounds normal; no masses,  no organomegaly Extremities: extremities normal, atraumatic, no cyanosis or edema Neurologic: Alert and oriented X 3, normal strength and tone. Normal symmetric reflexes. Normal coordination and gait Mouth: reveals thrush  ECOG PERFORMANCE STATUS: 1 - Symptomatic but completely ambulatory  Blood pressure 129/63, pulse 88, temperature 97.9 F (  36.6 C), temperature source Oral, resp. rate 18, height _0  (1.727 m), weight 169 lb 4.8 oz (76.794 kg), SpO2 97 %.  LABORATORY DATA: Lab Results  Component Value Date   WBC 6.3 04/01/2014   HGB 9.4* 04/01/2014   HCT 28.9* 04/01/2014   MCV 96.5 04/01/2014   PLT 221 04/01/2014      Chemistry      Component Value Date/Time   NA 137 04/01/2014 1304   NA 135* 01/10/2014 1446   K 4.4 04/01/2014 1304   K 4.3 01/10/2014 1446   CL 98 01/10/2014 1446   CO2 26 04/01/2014 1304   CO2 24 01/10/2014 1446   BUN 16.2 04/01/2014 1304   BUN 20 01/10/2014 1446   CREATININE 1.1 04/01/2014 1304   CREATININE 0.90 01/10/2014 1446      Component Value Date/Time   CALCIUM 9.8 04/01/2014 1304   CALCIUM 8.7 01/10/2014 1446   ALKPHOS 205* 04/01/2014 1304   ALKPHOS 129* 12/05/2012 1348   AST 29 04/01/2014 1304   AST 44* 12/05/2012 1348   ALT 27 04/01/2014 1304   ALT 35 12/05/2012 1348   BILITOT 0.59 04/01/2014 1304   BILITOT 0.5 12/05/2012 1348        RADIOGRAPHIC STUDIES: Ct Chest W Contrast  03/10/2014   CLINICAL DATA:  Restaging left lung cancer diagnosed 11/2012, chemotherapy and XRT complete, left chest pain, cough, shortness of breath.  EXAM: CT CHEST WITH CONTRAST  TECHNIQUE: Multidetector CT imaging of the chest was performed during intravenous contrast administration.  CONTRAST:  58m OMNIPAQUE IOHEXOL 300 MG/ML  SOLN  COMPARISON:  PET-CT dated 01/01/2014  FINDINGS: Mass-like opacity in the left upper lobe, measuring 6.7 x 3.3 cm (series 2/image 20), previously 3.9 x 7.2 cm. Additional tumor along the lateral left upper lobe/lingula measures 4.5 x 3.0 cm (series 2/image 26), previously 5.4 x 3.7 cm. Tumor extends to the left perihilar region (series 2/image 28).  Additional tumor versus rounded atelectasis along the subpleural left lower lobe measures 2.7 x 1.5 cm (series 2/image 40), previously 2.9 x 1.5 cm, grossly unchanged.  Trace left pleural effusion. Calcified pleural plaques. No pneumothorax.  Right lung is clear. Calcified pleural plaques. No pleural effusion or pneumothorax.  Visualized thyroid is unremarkable.  Heart is top-normal in size with leftward cardiomediastinal shift. No pericardial effusion. Coronary atherosclerosis. Atherosclerotic calcifications of the aortic arch.  Thoracic lymphadenopathy, mildly improved,, including:  --11 mm short axis left supraclavicular node (series 2/image 9), previously 14 mm  --7 mm short axis left paratracheal node (series 2/image 16), previously 10 mm  --11 mm short axis left prevascular node (series 2/image 24), unchanged  --9 mm short axis AP window node (series 2/image 24), previously 11 mm  --8 mm short axis subcarinal node (series 2/ image 31), previously 10 mm  Visualized upper abdomen is notable for cirrhosis, splenomegaly, vascular calcifications, and a suspected left adrenal adenoma.  Mild degenerative changes of the visualized thoracolumbar spine. Stable mild inferior endplate  deformity at T8 (sagittal image 56).  IMPRESSION: Extensive tumor in the left upper lobe extending to the left perihilar region, decreased.  Rounded atelectasis versus tumor along the left lower lobe, unchanged. Trace left pleural effusion.  Thoracic lymphadenopathy, as described above, mildly decreased.  Additional stable ancillary findings as above.   Electronically Signed   By: SJulian HyM.D.   On: 03/10/2014 16:57   ASSESSMENT/PLAN: This is a very pleasant 80years old white male recently diagnosed with a stage IIIa non-small  cell lung cancer currently undergoing concurrent chemoradiation with weekly carboplatin and paclitaxel status post 7 cycles.  He is currently on treatment with systemic chemotherapy with carboplatin and Alimta status post 4 cycles for recurrent disease. He tolerated the first cycle of his treatment fairly well with no significant adverse effects. The recent CT scan of the chest showed improvement in his disease. The patient was discussed with and also seen by Dr. Julien Nordmann. He will proceed with cycle #5 today as scheduled. For pain management he will continue on treatment with tramadol, however the patient is asked to try taking a half a tablet to see if this is helpful for pain management but also does not cause him to have issues with shaking. For cough the patient will continue on Tessalon. For the oral candidiasis a prescription for Diflucan was sent to his pharmacy of record via E scribed He will come back for follow-up visit in 3 weeks with the start of cycle #6.  He was advised to call immediately if he has any concerning symptoms in the interval. The patient was advised to call immediately if he has any concerning symptoms in the interval. All questions were answered. The patient knows to call the clinic with any problems, questions or concerns. We can certainly see the patient much sooner if necessary.  Disclaimer: This note was dictated with voice recognition  software. Similar sounding words can inadvertently be transcribed and may not be corrected upon review.   Carlton Adam, PA-C 04/01/2014     ADDENDUM: Hematology/Oncology Attending: I had a face to face encounter with the patient. I recommended his care plan. This is a very pleasant 80 years old white male with recurrent non-small cell lung cancer, adenocarcinoma with currently undergoing systemic chemotherapy with carboplatin and Alimta status post 4 cycles. The patient is tolerating his treatment fairly well with no significant adverse effects. I recommended for him to proceed with cycle #5 today as a scheduled. She will come back for follow-up visit in 3 weeks for evaluation before starting cycle #6. For pain management the patient will continue his current treatment with tramadol. He was advised to call immediately if he has any concerning symptoms in the interval.  Disclaimer: This note was dictated with voice recognition software. Similar sounding words can inadvertently be transcribed and may be missed upon review. Eilleen Kempf., MD 04/05/2014

## 2014-04-08 ENCOUNTER — Other Ambulatory Visit (HOSPITAL_BASED_OUTPATIENT_CLINIC_OR_DEPARTMENT_OTHER): Payer: Medicare Other

## 2014-04-08 DIAGNOSIS — C3412 Malignant neoplasm of upper lobe, left bronchus or lung: Secondary | ICD-10-CM

## 2014-04-08 DIAGNOSIS — C3492 Malignant neoplasm of unspecified part of left bronchus or lung: Secondary | ICD-10-CM

## 2014-04-08 LAB — COMPREHENSIVE METABOLIC PANEL (CC13)
ALBUMIN: 3.1 g/dL — AB (ref 3.5–5.0)
ALK PHOS: 172 U/L — AB (ref 40–150)
ALT: 32 U/L (ref 0–55)
ANION GAP: 9 meq/L (ref 3–11)
AST: 40 U/L — ABNORMAL HIGH (ref 5–34)
BUN: 17.6 mg/dL (ref 7.0–26.0)
CALCIUM: 9 mg/dL (ref 8.4–10.4)
CHLORIDE: 103 meq/L (ref 98–109)
CO2: 26 mEq/L (ref 22–29)
Creatinine: 1.1 mg/dL (ref 0.7–1.3)
EGFR: 64 mL/min/{1.73_m2} — ABNORMAL LOW (ref >90)
GLUCOSE: 137 mg/dL (ref 70–140)
Potassium: 4.2 mEq/L (ref 3.5–5.1)
SODIUM: 138 meq/L (ref 136–145)
TOTAL PROTEIN: 6.9 g/dL (ref 6.4–8.3)
Total Bilirubin: 0.81 mg/dL (ref 0.20–1.20)

## 2014-04-08 LAB — CBC WITH DIFFERENTIAL/PLATELET
BASO%: 1.6 % (ref 0.0–2.0)
Basophils Absolute: 0 10*3/uL (ref 0.0–0.1)
EOS ABS: 0 10*3/uL (ref 0.0–0.5)
EOS%: 1.2 % (ref 0.0–7.0)
HCT: 27.2 % — ABNORMAL LOW (ref 38.4–49.9)
HGB: 9 g/dL — ABNORMAL LOW (ref 13.0–17.1)
LYMPH#: 0.5 10*3/uL — AB (ref 0.9–3.3)
LYMPH%: 18.5 % (ref 14.0–49.0)
MCH: 31.7 pg (ref 27.2–33.4)
MCHC: 33.1 g/dL (ref 32.0–36.0)
MCV: 95.8 fL (ref 79.3–98.0)
MONO#: 0.2 10*3/uL (ref 0.1–0.9)
MONO%: 7.3 % (ref 0.0–14.0)
NEUT%: 71.4 % (ref 39.0–75.0)
NEUTROS ABS: 1.8 10*3/uL (ref 1.5–6.5)
Platelets: 152 10*3/uL (ref 140–400)
RBC: 2.84 10*6/uL — ABNORMAL LOW (ref 4.20–5.82)
RDW: 17.9 % — AB (ref 11.0–14.6)
WBC: 2.5 10*3/uL — AB (ref 4.0–10.3)

## 2014-04-15 ENCOUNTER — Other Ambulatory Visit (HOSPITAL_BASED_OUTPATIENT_CLINIC_OR_DEPARTMENT_OTHER): Payer: Medicare Other

## 2014-04-15 DIAGNOSIS — C3412 Malignant neoplasm of upper lobe, left bronchus or lung: Secondary | ICD-10-CM

## 2014-04-15 DIAGNOSIS — C3492 Malignant neoplasm of unspecified part of left bronchus or lung: Secondary | ICD-10-CM

## 2014-04-15 LAB — COMPREHENSIVE METABOLIC PANEL (CC13)
ALT: 28 U/L (ref 0–55)
ANION GAP: 8 meq/L (ref 3–11)
AST: 31 U/L (ref 5–34)
Albumin: 3.2 g/dL — ABNORMAL LOW (ref 3.5–5.0)
Alkaline Phosphatase: 172 U/L — ABNORMAL HIGH (ref 40–150)
BUN: 15.2 mg/dL (ref 7.0–26.0)
CHLORIDE: 103 meq/L (ref 98–109)
CO2: 29 meq/L (ref 22–29)
CREATININE: 1.1 mg/dL (ref 0.7–1.3)
Calcium: 9.6 mg/dL (ref 8.4–10.4)
EGFR: 66 mL/min/{1.73_m2} — ABNORMAL LOW (ref >90)
Glucose: 133 mg/dl (ref 70–140)
Potassium: 4.3 mEq/L (ref 3.5–5.1)
Sodium: 140 mEq/L (ref 136–145)
TOTAL PROTEIN: 7.1 g/dL (ref 6.4–8.3)
Total Bilirubin: 0.79 mg/dL (ref 0.20–1.20)

## 2014-04-15 LAB — CBC WITH DIFFERENTIAL/PLATELET
BASO%: 0.2 % (ref 0.0–2.0)
Basophils Absolute: 0 10*3/uL (ref 0.0–0.1)
EOS ABS: 0.1 10*3/uL (ref 0.0–0.5)
EOS%: 1.2 % (ref 0.0–7.0)
HCT: 27.3 % — ABNORMAL LOW (ref 38.4–49.9)
HGB: 8.8 g/dL — ABNORMAL LOW (ref 13.0–17.1)
LYMPH#: 0.7 10*3/uL — AB (ref 0.9–3.3)
LYMPH%: 18.1 % (ref 14.0–49.0)
MCH: 31.4 pg (ref 27.2–33.4)
MCHC: 32.2 g/dL (ref 32.0–36.0)
MCV: 97.5 fL (ref 79.3–98.0)
MONO#: 0.7 10*3/uL (ref 0.1–0.9)
MONO%: 17.6 % — AB (ref 0.0–14.0)
NEUT#: 2.5 10*3/uL (ref 1.5–6.5)
NEUT%: 62.9 % (ref 39.0–75.0)
PLATELETS: 78 10*3/uL — AB (ref 140–400)
RBC: 2.8 10*6/uL — ABNORMAL LOW (ref 4.20–5.82)
RDW: 17.8 % — ABNORMAL HIGH (ref 11.0–14.6)
WBC: 4 10*3/uL (ref 4.0–10.3)

## 2014-04-22 ENCOUNTER — Ambulatory Visit (HOSPITAL_BASED_OUTPATIENT_CLINIC_OR_DEPARTMENT_OTHER): Payer: Medicare Other | Admitting: Internal Medicine

## 2014-04-22 ENCOUNTER — Ambulatory Visit (HOSPITAL_BASED_OUTPATIENT_CLINIC_OR_DEPARTMENT_OTHER): Payer: Medicare Other

## 2014-04-22 ENCOUNTER — Telehealth: Payer: Self-pay | Admitting: Internal Medicine

## 2014-04-22 ENCOUNTER — Other Ambulatory Visit (HOSPITAL_BASED_OUTPATIENT_CLINIC_OR_DEPARTMENT_OTHER): Payer: Medicare Other

## 2014-04-22 ENCOUNTER — Encounter: Payer: Self-pay | Admitting: Internal Medicine

## 2014-04-22 VITALS — BP 109/60 | HR 105 | Temp 97.8°F | Resp 18 | Ht 68.0 in | Wt 168.0 lb

## 2014-04-22 DIAGNOSIS — Z5111 Encounter for antineoplastic chemotherapy: Secondary | ICD-10-CM

## 2014-04-22 DIAGNOSIS — C3412 Malignant neoplasm of upper lobe, left bronchus or lung: Secondary | ICD-10-CM

## 2014-04-22 DIAGNOSIS — C3492 Malignant neoplasm of unspecified part of left bronchus or lung: Secondary | ICD-10-CM

## 2014-04-22 LAB — CBC WITH DIFFERENTIAL/PLATELET
BASO%: 0.9 % (ref 0.0–2.0)
BASOS ABS: 0 10*3/uL (ref 0.0–0.1)
EOS%: 1.2 % (ref 0.0–7.0)
Eosinophils Absolute: 0.1 10*3/uL (ref 0.0–0.5)
HCT: 28.3 % — ABNORMAL LOW (ref 38.4–49.9)
HEMOGLOBIN: 9.1 g/dL — AB (ref 13.0–17.1)
LYMPH%: 13.2 % — AB (ref 14.0–49.0)
MCH: 32.3 pg (ref 27.2–33.4)
MCHC: 32.1 g/dL (ref 32.0–36.0)
MCV: 100.5 fL — AB (ref 79.3–98.0)
MONO#: 0.7 10*3/uL (ref 0.1–0.9)
MONO%: 14.3 % — AB (ref 0.0–14.0)
NEUT#: 3.4 10*3/uL (ref 1.5–6.5)
NEUT%: 70.4 % (ref 39.0–75.0)
PLATELETS: 164 10*3/uL (ref 140–400)
RBC: 2.82 10*6/uL — ABNORMAL LOW (ref 4.20–5.82)
RDW: 19 % — AB (ref 11.0–14.6)
WBC: 4.9 10*3/uL (ref 4.0–10.3)
lymph#: 0.6 10*3/uL — ABNORMAL LOW (ref 0.9–3.3)

## 2014-04-22 LAB — COMPREHENSIVE METABOLIC PANEL (CC13)
ALBUMIN: 3.1 g/dL — AB (ref 3.5–5.0)
ALT: 26 U/L (ref 0–55)
AST: 34 U/L (ref 5–34)
Alkaline Phosphatase: 183 U/L — ABNORMAL HIGH (ref 40–150)
Anion Gap: 10 mEq/L (ref 3–11)
BUN: 14.5 mg/dL (ref 7.0–26.0)
CALCIUM: 9 mg/dL (ref 8.4–10.4)
CHLORIDE: 104 meq/L (ref 98–109)
CO2: 26 mEq/L (ref 22–29)
Creatinine: 1 mg/dL (ref 0.7–1.3)
EGFR: 68 mL/min/{1.73_m2} — ABNORMAL LOW (ref >90)
Glucose: 160 mg/dl — ABNORMAL HIGH (ref 70–140)
POTASSIUM: 4.1 meq/L (ref 3.5–5.1)
SODIUM: 140 meq/L (ref 136–145)
Total Bilirubin: 0.71 mg/dL (ref 0.20–1.20)
Total Protein: 7 g/dL (ref 6.4–8.3)

## 2014-04-22 MED ORDER — DEXAMETHASONE SODIUM PHOSPHATE 20 MG/5ML IJ SOLN
INTRAMUSCULAR | Status: AC
  Filled 2014-04-22: qty 5

## 2014-04-22 MED ORDER — SODIUM CHLORIDE 0.9 % IV SOLN
Freq: Once | INTRAVENOUS | Status: AC
  Administered 2014-04-22: 14:00:00 via INTRAVENOUS

## 2014-04-22 MED ORDER — CARBOPLATIN CHEMO INTRADERMAL TEST DOSE 100MCG/0.02ML
100.0000 ug | Freq: Once | INTRADERMAL | Status: AC
  Administered 2014-04-22: 100 ug via INTRADERMAL
  Filled 2014-04-22: qty 0.01

## 2014-04-22 MED ORDER — SODIUM CHLORIDE 0.9 % IV SOLN
500.0000 mg/m2 | Freq: Once | INTRAVENOUS | Status: AC
  Administered 2014-04-22: 975 mg via INTRAVENOUS
  Filled 2014-04-22: qty 39

## 2014-04-22 MED ORDER — CARBOPLATIN CHEMO INJECTION 600 MG/60ML
420.0000 mg | Freq: Once | INTRAVENOUS | Status: AC
  Administered 2014-04-22: 420 mg via INTRAVENOUS
  Filled 2014-04-22: qty 42

## 2014-04-22 MED ORDER — ONDANSETRON 16 MG/50ML IVPB (CHCC)
INTRAVENOUS | Status: AC
  Filled 2014-04-22: qty 16

## 2014-04-22 MED ORDER — DEXAMETHASONE SODIUM PHOSPHATE 20 MG/5ML IJ SOLN
20.0000 mg | Freq: Once | INTRAMUSCULAR | Status: AC
  Administered 2014-04-22: 20 mg via INTRAVENOUS

## 2014-04-22 MED ORDER — ONDANSETRON 16 MG/50ML IVPB (CHCC)
16.0000 mg | Freq: Once | INTRAVENOUS | Status: AC
  Administered 2014-04-22: 16 mg via INTRAVENOUS

## 2014-04-22 NOTE — Progress Notes (Signed)
[facility]  Telephone:([phone]) [phone] Fax:(336) [phone]  OFFICE VISIT PROGRESS NOTE  [name], [place] [address]  DIAGNOSIS: Recurrent non-small cell lung cancer initially diagnosed as Stage IIIA (T1b, N2, M0) non-small cell lung cancer, adenocarcinoma with negative EGFR mutation and negative ALK gene translocation  PRIOR THERAPY: Concurrent chemoradiation with weekly carboplatin for an AUC of 2 and paclitaxel at 45 mg per meter squared concurrent with radiation therapy under the care of Dr. Lisbeth Renshaw. He status post 7 cycles of chemotherapy. Last dose was given 02/11/2013 with partial response.  CURRENT THERAPY: Systemic chemotherapy with carboplatin for AUC of 5 and Alimta 500 mg/M2 every 3 weeks. First dose on 01/07/2014. Status post 5 cycles.  DISEASE STAGE: Stage IIIA (T1b, N2, M0)  CHEMOTHERAPY INTENT: Palliative.  CURRENT # OF CHEMOTHERAPY CYCLES: 6  CURRENT ANTIEMETICS: Zofran, dexamethasone, and Compazine for at home use  CURRENT SMOKING STATUS: Former smoker quit 12/20/1992  ORAL CHEMOTHERAPY AND CONSENT: n/a  CURRENT BISPHOSPHONATES USE: None  PAIN MANAGEMENT: Dilaudid  NARCOTICS INDUCED CONSTIPATION: None  LIVING WILL AND CODE STATUS: ?  INTERVAL HISTORY: Christopher Roach 81 y.o. male returns for followup visit. He completed 5 cycles of systemic chemotherapy with carboplatin and Alimta fairly well. He continues to complain of dry cough but no hemoptysis. He has occasional right-sided chest pain with shortness of breath with exertion. The patient denied having any nausea or vomiting. He denied having any fever or chills. He had repeat CT scan of the chest performed recently and is here for evaluation and discussion of his scan results. He is here to start the sixth cycle of his systemic chemotherapy.  MEDICAL HISTORY: Past Medical History  Diagnosis Date  . Hypertension   . Hyperlipidemia   . Hypothyroidism   .  Abnormal LFTs   . Gallbladder polyp   . BPH (benign prostatic hyperplasia)   . Actinic keratosis   . Lung nodule     benign BX 03/24/2008  . Anemia   . H/O asbestos exposure   . Pneumonia     hx of walking PNA  . Shortness of breath     with exertion  . Constipation     from medications  . Arthritis   . Kidney stone August 2014    hx of  . Birth mark     left arm and shoulder; Dark purple on upper L chest ;NOT A RASH    ALLERGIES:  is allergic to codeine.  MEDICATIONS:  Current Outpatient Prescriptions  Medication Sig Dispense Refill  . ALPRAZolam (XANAX) 1 MG tablet   0  . amLODipine (NORVASC) 10 MG tablet Take 10 mg by mouth at bedtime.     Marland Kitchen aspirin EC 81 MG tablet Take 81 mg by mouth daily.    . benzonatate (TESSALON) 200 MG capsule     . chlorproMAZINE (THORAZINE) 25 MG tablet Take 25 mg by mouth 3 (three) times daily as needed.    Marland Kitchen dexamethasone (DECADRON) 4 MG tablet 4 mg by mouth twice a day the day before, day of and day after the chemotherapy every 3 weeks. 40 tablet 1  . Eszopiclone (ESZOPICLONE) 3 MG TABS Take 3 mg by mouth at bedtime. Take immediately before bedtime    . fluconazole (DIFLUCAN) 100 MG tablet Take 1 tablet (100 mg total) by mouth daily. 10 tablet 0  . folic acid (FOLVITE) 1 MG tablet Take 1 tablet (1 mg total) by mouth daily. 30 tablet  4  . levothyroxine (SYNTHROID, LEVOTHROID) 125 MCG tablet Take 125 mcg by mouth at bedtime.     . prochlorperazine (COMPAZINE) 10 MG tablet Take 1 tablet (10 mg total) by mouth every 6 (six) hours as needed for nausea or vomiting. 30 tablet 0  . rosuvastatin (CRESTOR) 10 MG tablet Take 10 mg by mouth once a week.     . tamsulosin (FLOMAX) 0.4 MG CAPS Take 0.4 mg by mouth at bedtime.     . traMADol (ULTRAM) 50 MG tablet Take 1 tablet (50 mg total) by mouth every 6 (six) hours as needed. 30 tablet 0  . traZODone (DESYREL) 50 MG tablet 1 tablet at bedtime as needed.  0   No current facility-administered medications  for this visit.    SURGICAL HISTORY:  Past Surgical History  Procedure Laterality Date  . Back surgery      Dr Collie Siad 1978 and 1982  . Cervical disc surgery    . Right foot surgery      plantar fascitis  . Right shoulder surgery      torn rotator cuff  . Right ankle fracture         . Right wrist fracture    . Foot surgery Left   . Tonsillectomy    . Appendectomy    . Colonoscopy    . Flexible bronchoscopy N/A 12/07/2012    Procedure: FLEXIBLE BRONCHOSCOPY;  Surgeon: Gaye Pollack, MD;  Location: [facility] OR;  Service: Thoracic;  Laterality: N/A;  . Video assisted thoracoscopy (vats)/thorocotomy Left 12/07/2012    Procedure: VIDEO ASSISTED THORACOSCOPY (VATS)/ EXPLORATORY THOROCOTOMY;  Surgeon: Gaye Pollack, MD;  Location: [facility] OR;  Service: Thoracic;  Laterality: Left;    REVIEW OF SYSTEMS:  Constitutional: positive for fatigue Eyes: negative Ears, nose, mouth, throat, and face: negative Respiratory: positive for cough, dyspnea on exertion and pleurisy/chest pain Cardiovascular: negative Gastrointestinal: negative Genitourinary:negative Integument/breast: negative Hematologic/lymphatic: negative Musculoskeletal:negative Neurological: negative Behavioral/Psych: negative Endocrine: negative Allergic/Immunologic: negative   PHYSICAL EXAMINATION: General appearance: alert, cooperative, appears stated age and no distress Head: Normocephalic, without obvious abnormality, atraumatic Neck: no adenopathy, no JVD, supple, symmetrical, trachea midline and thyroid not enlarged, symmetric, no tenderness/mass/nodules Lymph nodes: Cervical, supraclavicular, and axillary nodes normal. Resp: diminished breath sounds LUL and dullness to percussion LUL Cardio: regular rate and rhythm, S1, S2 normal, no murmur, click, rub or gallop GI: soft, non-tender; bowel sounds normal; no masses,  no organomegaly Extremities: extremities normal, atraumatic, no cyanosis or edema Neurologic: Alert and oriented  X 3, normal strength and tone. Normal symmetric reflexes. Normal coordination and gait  ECOG PERFORMANCE STATUS: 1 - Symptomatic but completely ambulatory  Blood pressure 109/60, pulse 105, temperature 97.8 F (36.6 C), temperature source Oral, resp. rate 18, height $RemoveBe'5\' 8"'TJpDsFDuo$  (1.727 m), weight 168 lb (76.204 kg), SpO2 100 %.  LABORATORY DATA: Lab Results  Component Value Date   WBC 4.9 04/22/2014   HGB 9.1* 04/22/2014   HCT 28.3* 04/22/2014   MCV 100.5* 04/22/2014   PLT 164 04/22/2014      Chemistry      Component Value Date/Time   NA 140 04/15/2014 1121   NA 135* 01/10/2014 1446   K 4.3 04/15/2014 1121   K 4.3 01/10/2014 1446   CL 98 01/10/2014 1446   CO2 29 04/15/2014 1121   CO2 24 01/10/2014 1446   BUN 15.2 04/15/2014 1121   BUN 20 01/10/2014 1446   CREATININE 1.1 04/15/2014 1121   CREATININE 0.90 01/10/2014 1446  Component Value Date/Time   CALCIUM 9.6 04/15/2014 1121   CALCIUM 8.7 01/10/2014 1446   ALKPHOS 172* 04/15/2014 1121   ALKPHOS 129* 12/05/2012 1348   AST 31 04/15/2014 1121   AST 44* 12/05/2012 1348   ALT 28 04/15/2014 1121   ALT 35 12/05/2012 1348   BILITOT 0.79 04/15/2014 1121   BILITOT 0.5 12/05/2012 1348       RADIOGRAPHIC STUDIES: No results found. ASSESSMENT/PLAN: This is a very pleasant 81 years old white male recently diagnosed with a stage IIIa non-small cell lung cancer currently undergoing concurrent chemoradiation with weekly carboplatin and paclitaxel status post 7 cycles.  He is currently on treatment with systemic chemotherapy with carboplatin and Alimta status post 5 cycles for recurrent disease. I recommended for the patient to proceed with cycle #6 today as a scheduled. He would come back for follow-up visit in 3 weeks for evaluation after repeating CT scan of the chest, abdomen and pelvis for restaging of his disease. He was advised to call immediately if he has any concerning symptoms in the interval. The patient was advised to  call immediately if he has any concerning symptoms in the interval. All questions were answered. The patient knows to call the clinic with any problems, questions or concerns. We can certainly see the patient much sooner if necessary.  Disclaimer: This note was dictated with voice recognition software. Similar sounding words can inadvertently be transcribed and may not be corrected upon review.   Eilleen Kempf., MD 04/22/2014

## 2014-04-22 NOTE — Patient Instructions (Signed)
[facility] Discharge Instructions for Patients Receiving Chemotherapy  Today you received the following chemotherapy agents Alimta/Carboplatin.  To help prevent nausea and vomiting after your treatment, we encourage you to take your nausea medication as directed.    If you develop nausea and vomiting that is not controlled by your nausea medication, call the clinic.   BELOW ARE SYMPTOMS THAT SHOULD BE REPORTED IMMEDIATELY:  *FEVER GREATER THAN 100.5 F  *CHILLS WITH OR WITHOUT FEVER  NAUSEA AND VOMITING THAT IS NOT CONTROLLED WITH YOUR NAUSEA MEDICATION  *UNUSUAL SHORTNESS OF BREATH  *UNUSUAL BRUISING OR BLEEDING  TENDERNESS IN MOUTH AND THROAT WITH OR WITHOUT PRESENCE OF ULCERS  *URINARY PROBLEMS  *BOWEL PROBLEMS  UNUSUAL RASH Items with * indicate a potential emergency and should be followed up as soon as possible.  Feel free to call the clinic you have any questions or concerns. The clinic phone number is [phone].

## 2014-04-22 NOTE — Telephone Encounter (Signed)
Gave avs & cal for Jan/Feb. Sent mess to sch tx.

## 2014-04-29 ENCOUNTER — Other Ambulatory Visit (HOSPITAL_BASED_OUTPATIENT_CLINIC_OR_DEPARTMENT_OTHER): Payer: Medicare Other

## 2014-04-29 DIAGNOSIS — C3492 Malignant neoplasm of unspecified part of left bronchus or lung: Secondary | ICD-10-CM

## 2014-04-29 DIAGNOSIS — C3412 Malignant neoplasm of upper lobe, left bronchus or lung: Secondary | ICD-10-CM

## 2014-04-29 LAB — COMPREHENSIVE METABOLIC PANEL (CC13)
ALT: 43 U/L (ref 0–55)
AST: 52 U/L — ABNORMAL HIGH (ref 5–34)
Albumin: 3.3 g/dL — ABNORMAL LOW (ref 3.5–5.0)
Alkaline Phosphatase: 194 U/L — ABNORMAL HIGH (ref 40–150)
Anion Gap: 11 mEq/L (ref 3–11)
BILIRUBIN TOTAL: 0.83 mg/dL (ref 0.20–1.20)
BUN: 21.4 mg/dL (ref 7.0–26.0)
CALCIUM: 9.2 mg/dL (ref 8.4–10.4)
CO2: 25 meq/L (ref 22–29)
Chloride: 102 mEq/L (ref 98–109)
Creatinine: 1.2 mg/dL (ref 0.7–1.3)
EGFR: 55 mL/min/{1.73_m2} — ABNORMAL LOW (ref >90)
GLUCOSE: 156 mg/dL — AB (ref 70–140)
Potassium: 4.2 mEq/L (ref 3.5–5.1)
Sodium: 139 mEq/L (ref 136–145)
Total Protein: 7.3 g/dL (ref 6.4–8.3)

## 2014-04-29 LAB — CBC WITH DIFFERENTIAL/PLATELET
BASO%: 2.5 % — ABNORMAL HIGH (ref 0.0–2.0)
Basophils Absolute: 0.1 10*3/uL (ref 0.0–0.1)
EOS ABS: 0 10*3/uL (ref 0.0–0.5)
EOS%: 1.1 % (ref 0.0–7.0)
HEMATOCRIT: 28 % — AB (ref 38.4–49.9)
HEMOGLOBIN: 8.8 g/dL — AB (ref 13.0–17.1)
LYMPH%: 20 % (ref 14.0–49.0)
MCH: 31.4 pg (ref 27.2–33.4)
MCHC: 31.6 g/dL — AB (ref 32.0–36.0)
MCV: 99.4 fL — ABNORMAL HIGH (ref 79.3–98.0)
MONO#: 0.2 10*3/uL (ref 0.1–0.9)
MONO%: 8.8 % (ref 0.0–14.0)
NEUT#: 1.6 10*3/uL (ref 1.5–6.5)
NEUT%: 67.6 % (ref 39.0–75.0)
Platelets: 175 10*3/uL (ref 140–400)
RBC: 2.82 10*6/uL — AB (ref 4.20–5.82)
RDW: 17.1 % — AB (ref 11.0–14.6)
WBC: 2.4 10*3/uL — AB (ref 4.0–10.3)
lymph#: 0.5 10*3/uL — ABNORMAL LOW (ref 0.9–3.3)

## 2014-05-01 IMAGING — CR DG CHEST 2V
3 series · 3 of 3 positions shown · non-contrast
Comparison: 12/11/2012 and 12/10/2012.

CLINICAL DATA: Postoperative evaluation after resection of lung
mass.

CHEST - 2 VIEW

[view not recorded (1 of 3)]
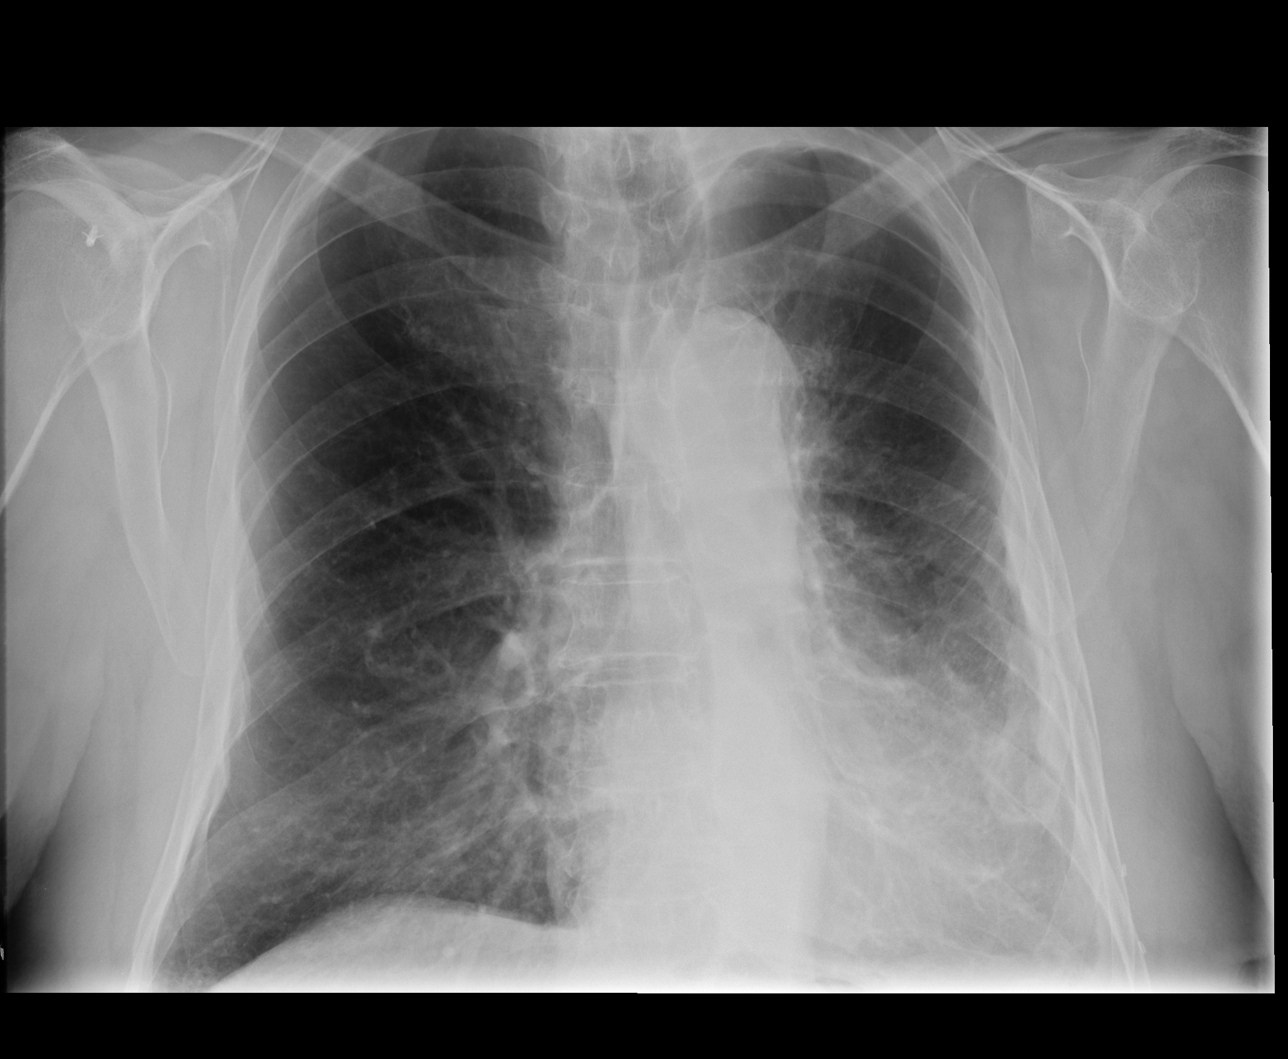

[view not recorded (2 of 3)]
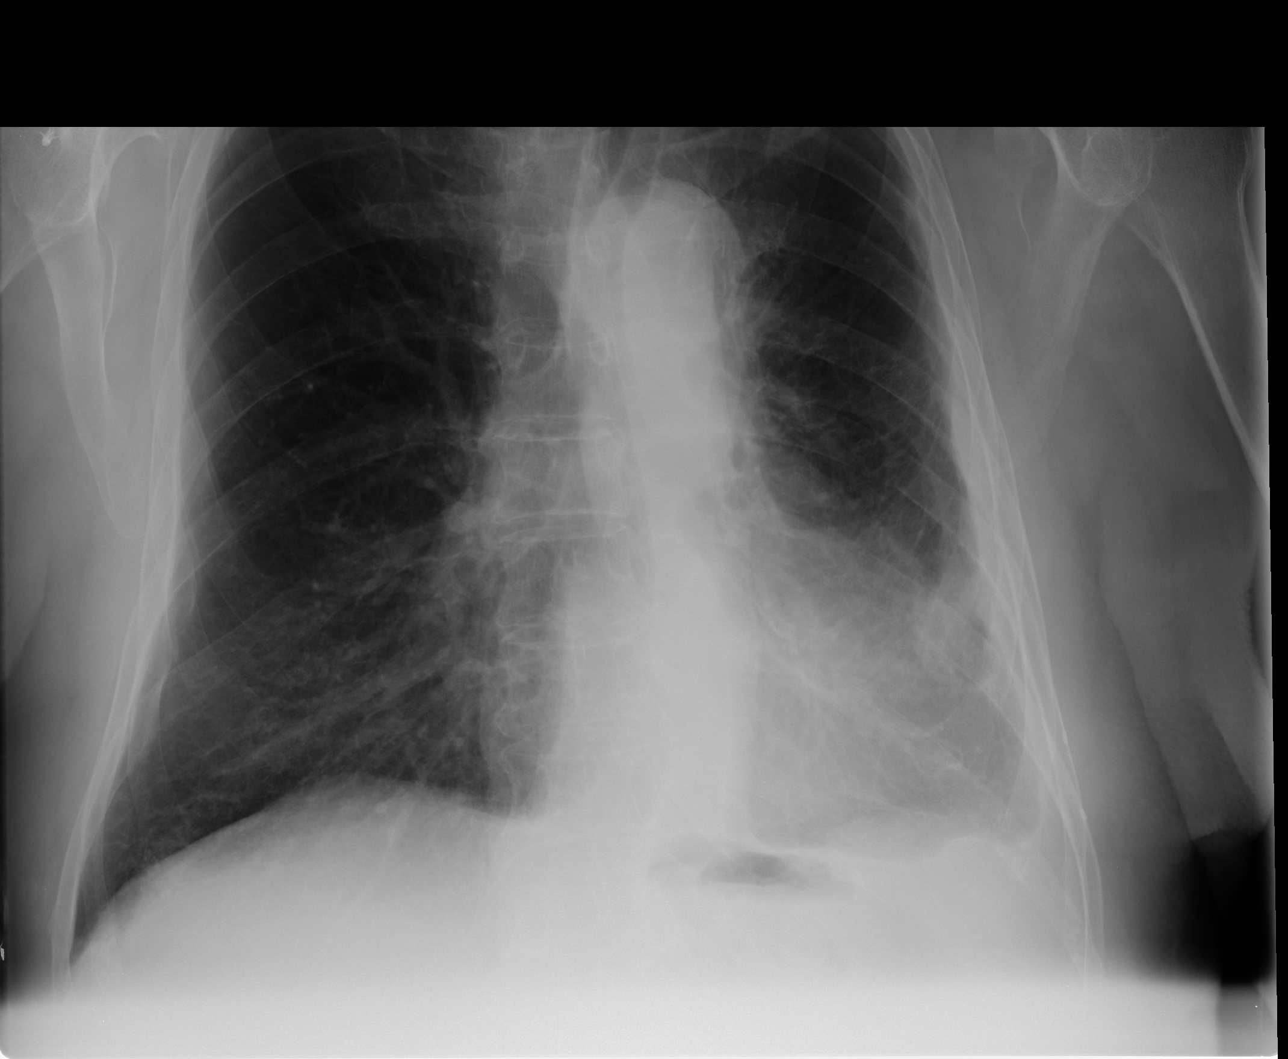

[view not recorded (3 of 3)]
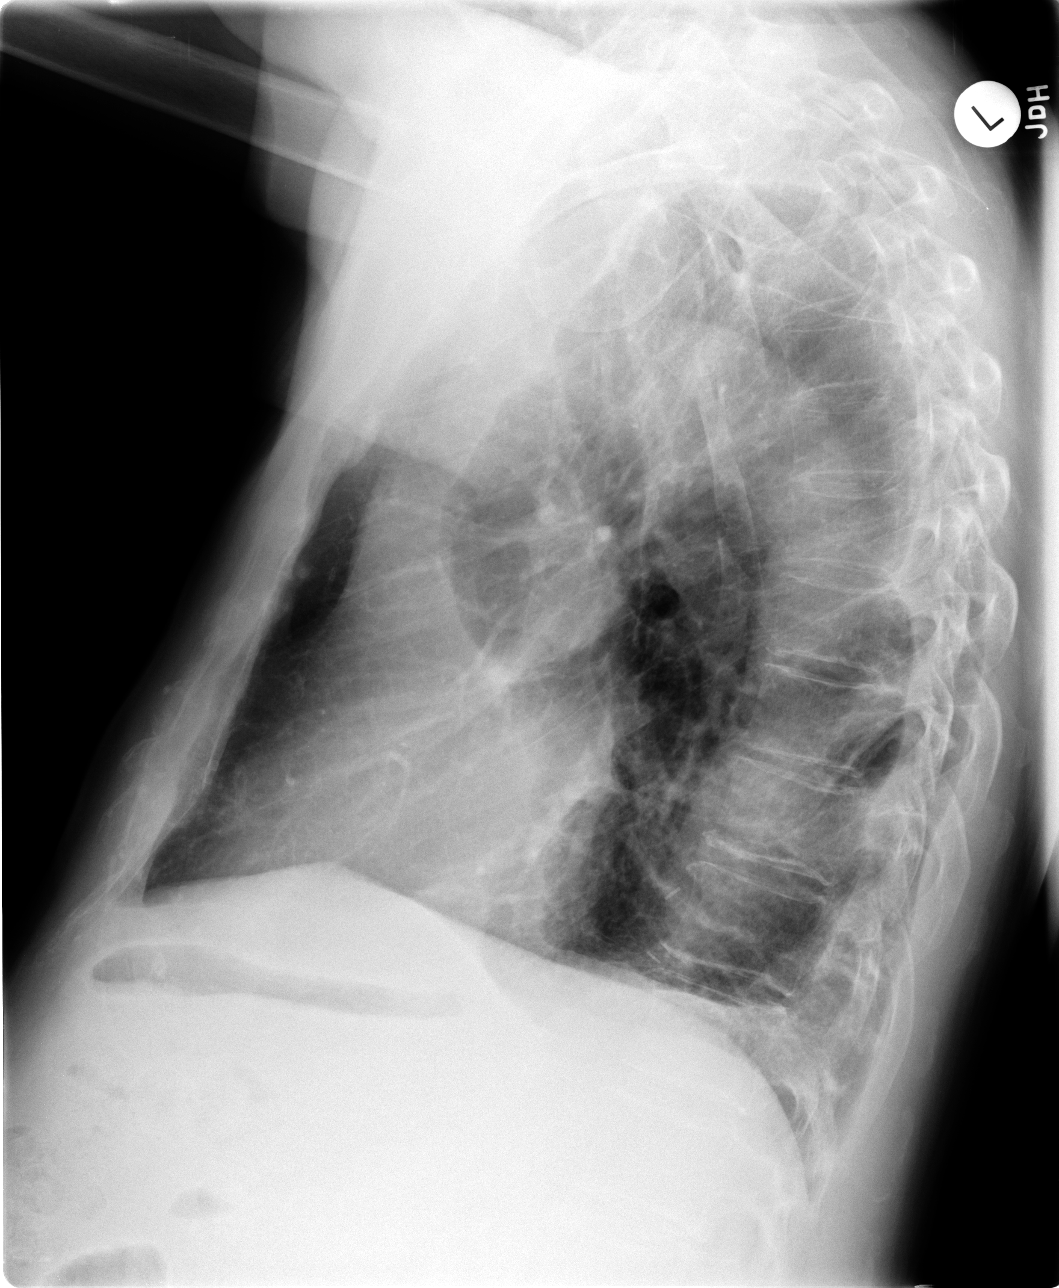

[3 of 3 positions shown; findings below may reference images not displayed]

FINDINGS: Stable volume loss in the left hemithorax.  No evidence
for pneumothorax.  Left-sided pleural thickening towards the base
is stable.  Subcutaneous emphysema and the basilar pneumothorax
seen previously have resolved in the interval.  Right lung is
hyperexpanded but clear. The cardiopericardial silhouette is
enlarged.  Bones are diffusely demineralized.
IMPRESSION: Interval resolution of tiny left pneumothorax and subcutaneous
emphysema.

Stable appearance of pleural thickening in the lower left
hemithorax.

## 2014-05-06 ENCOUNTER — Other Ambulatory Visit (HOSPITAL_BASED_OUTPATIENT_CLINIC_OR_DEPARTMENT_OTHER): Payer: Medicare Other

## 2014-05-06 DIAGNOSIS — C3492 Malignant neoplasm of unspecified part of left bronchus or lung: Secondary | ICD-10-CM

## 2014-05-06 DIAGNOSIS — C3412 Malignant neoplasm of upper lobe, left bronchus or lung: Secondary | ICD-10-CM

## 2014-05-06 LAB — CBC WITH DIFFERENTIAL/PLATELET
BASO%: 0.3 % (ref 0.0–2.0)
Basophils Absolute: 0 10*3/uL (ref 0.0–0.1)
EOS ABS: 0.1 10*3/uL (ref 0.0–0.5)
EOS%: 1.3 % (ref 0.0–7.0)
HCT: 25.4 % — ABNORMAL LOW (ref 38.4–49.9)
HEMOGLOBIN: 8.3 g/dL — AB (ref 13.0–17.1)
LYMPH%: 14.7 % (ref 14.0–49.0)
MCH: 32.3 pg (ref 27.2–33.4)
MCHC: 32.7 g/dL (ref 32.0–36.0)
MCV: 98.8 fL — ABNORMAL HIGH (ref 79.3–98.0)
MONO#: 0.8 10*3/uL (ref 0.1–0.9)
MONO%: 19.9 % — ABNORMAL HIGH (ref 0.0–14.0)
NEUT#: 2.4 10*3/uL (ref 1.5–6.5)
NEUT%: 63.8 % (ref 39.0–75.0)
Platelets: 64 10*3/uL — ABNORMAL LOW (ref 140–400)
RBC: 2.57 10*6/uL — AB (ref 4.20–5.82)
RDW: 16 % — AB (ref 11.0–14.6)
WBC: 3.8 10*3/uL — AB (ref 4.0–10.3)
lymph#: 0.6 10*3/uL — ABNORMAL LOW (ref 0.9–3.3)

## 2014-05-06 LAB — COMPREHENSIVE METABOLIC PANEL (CC13)
ALT: 26 U/L (ref 0–55)
AST: 32 U/L (ref 5–34)
Albumin: 3.1 g/dL — ABNORMAL LOW (ref 3.5–5.0)
Alkaline Phosphatase: 167 U/L — ABNORMAL HIGH (ref 40–150)
Anion Gap: 10 mEq/L (ref 3–11)
BILIRUBIN TOTAL: 0.59 mg/dL (ref 0.20–1.20)
BUN: 15.1 mg/dL (ref 7.0–26.0)
CHLORIDE: 106 meq/L (ref 98–109)
CO2: 26 meq/L (ref 22–29)
Calcium: 8.9 mg/dL (ref 8.4–10.4)
Creatinine: 1 mg/dL (ref 0.7–1.3)
EGFR: 67 mL/min/{1.73_m2} — ABNORMAL LOW (ref >90)
Glucose: 111 mg/dl (ref 70–140)
Potassium: 4.5 mEq/L (ref 3.5–5.1)
Sodium: 142 mEq/L (ref 136–145)
TOTAL PROTEIN: 6.9 g/dL (ref 6.4–8.3)

## 2014-05-12 ENCOUNTER — Ambulatory Visit (HOSPITAL_COMMUNITY)
Admission: RE | Admit: 2014-05-12 | Discharge: 2014-05-12 | Disposition: A | Discharge status: Home / Self Care | Payer: Medicare Other | Source: Ambulatory Visit | Attending: Internal Medicine | Admitting: Internal Medicine

## 2014-05-12 ENCOUNTER — Encounter (HOSPITAL_COMMUNITY): Payer: Self-pay

## 2014-05-12 DIAGNOSIS — Z923 Personal history of irradiation: Secondary | ICD-10-CM | POA: Diagnosis not present

## 2014-05-12 DIAGNOSIS — C3412 Malignant neoplasm of upper lobe, left bronchus or lung: Secondary | ICD-10-CM | POA: Insufficient documentation

## 2014-05-12 DIAGNOSIS — R0602 Shortness of breath: Secondary | ICD-10-CM | POA: Insufficient documentation

## 2014-05-12 DIAGNOSIS — R05 Cough: Secondary | ICD-10-CM | POA: Insufficient documentation

## 2014-05-12 DIAGNOSIS — Z79899 Other long term (current) drug therapy: Secondary | ICD-10-CM | POA: Insufficient documentation

## 2014-05-12 MED ORDER — IOHEXOL 300 MG/ML  SOLN
100.0000 mL | Freq: Once | INTRAMUSCULAR | Status: AC | PRN
  Administered 2014-05-12: 100 mL via INTRAVENOUS

## 2014-05-12 MED ORDER — IOHEXOL 300 MG/ML  SOLN
50.0000 mL | Freq: Once | INTRAMUSCULAR | Status: AC | PRN
  Administered 2014-05-12: 50 mL via ORAL

## 2014-05-13 ENCOUNTER — Ambulatory Visit: Payer: Medicare Other

## 2014-05-13 ENCOUNTER — Ambulatory Visit (HOSPITAL_BASED_OUTPATIENT_CLINIC_OR_DEPARTMENT_OTHER): Payer: Medicare Other | Admitting: Internal Medicine

## 2014-05-13 ENCOUNTER — Other Ambulatory Visit: Payer: Self-pay | Admitting: Medical Oncology

## 2014-05-13 ENCOUNTER — Encounter: Payer: Self-pay | Admitting: Internal Medicine

## 2014-05-13 ENCOUNTER — Other Ambulatory Visit (HOSPITAL_BASED_OUTPATIENT_CLINIC_OR_DEPARTMENT_OTHER): Payer: Medicare Other

## 2014-05-13 ENCOUNTER — Other Ambulatory Visit: Payer: Medicare Other

## 2014-05-13 ENCOUNTER — Telehealth: Payer: Self-pay | Admitting: Internal Medicine

## 2014-05-13 ENCOUNTER — Ambulatory Visit (HOSPITAL_COMMUNITY)
Admission: RE | Admit: 2014-05-13 | Discharge: 2014-05-13 | Disposition: A | Discharge status: Home / Self Care | Payer: Medicare Other | Source: Ambulatory Visit | Attending: Internal Medicine | Admitting: Internal Medicine

## 2014-05-13 VITALS — BP 114/58 | HR 81 | Temp 98.2°F | Resp 18 | Ht 68.0 in | Wt 164.3 lb

## 2014-05-13 DIAGNOSIS — B370 Candidal stomatitis: Secondary | ICD-10-CM

## 2014-05-13 DIAGNOSIS — C3492 Malignant neoplasm of unspecified part of left bronchus or lung: Secondary | ICD-10-CM

## 2014-05-13 DIAGNOSIS — D6481 Anemia due to antineoplastic chemotherapy: Secondary | ICD-10-CM

## 2014-05-13 DIAGNOSIS — C3412 Malignant neoplasm of upper lobe, left bronchus or lung: Secondary | ICD-10-CM

## 2014-05-13 DIAGNOSIS — T451X5A Adverse effect of antineoplastic and immunosuppressive drugs, initial encounter: Principal | ICD-10-CM

## 2014-05-13 LAB — COMPREHENSIVE METABOLIC PANEL (CC13)
ALT: 22 U/L (ref 0–55)
AST: 34 U/L (ref 5–34)
Albumin: 3 g/dL — ABNORMAL LOW (ref 3.5–5.0)
Alkaline Phosphatase: 174 U/L — ABNORMAL HIGH (ref 40–150)
Anion Gap: 12 mEq/L — ABNORMAL HIGH (ref 3–11)
BUN: 11.2 mg/dL (ref 7.0–26.0)
CO2: 26 mEq/L (ref 22–29)
Calcium: 8.8 mg/dL (ref 8.4–10.4)
Chloride: 104 mEq/L (ref 98–109)
Creatinine: 1 mg/dL (ref 0.7–1.3)
EGFR: 72 mL/min/{1.73_m2} — ABNORMAL LOW (ref >90)
Glucose: 126 mg/dl (ref 70–140)
Potassium: 4 mEq/L (ref 3.5–5.1)
Sodium: 141 mEq/L (ref 136–145)
Total Bilirubin: 0.57 mg/dL (ref 0.20–1.20)
Total Protein: 6.9 g/dL (ref 6.4–8.3)

## 2014-05-13 LAB — CBC WITH DIFFERENTIAL/PLATELET
BASO%: 0.7 % (ref 0.0–2.0)
Basophils Absolute: 0 10*3/uL (ref 0.0–0.1)
EOS%: 1.1 % (ref 0.0–7.0)
Eosinophils Absolute: 0 10*3/uL (ref 0.0–0.5)
HCT: 26.2 % — ABNORMAL LOW (ref 38.4–49.9)
HGB: 8.1 g/dL — ABNORMAL LOW (ref 13.0–17.1)
LYMPH%: 11.2 % — ABNORMAL LOW (ref 14.0–49.0)
MCH: 31.2 pg (ref 27.2–33.4)
MCHC: 31 g/dL — ABNORMAL LOW (ref 32.0–36.0)
MCV: 100.4 fL — ABNORMAL HIGH (ref 79.3–98.0)
MONO#: 0.7 10*3/uL (ref 0.1–0.9)
MONO%: 16.5 % — ABNORMAL HIGH (ref 0.0–14.0)
NEUT#: 2.9 10*3/uL (ref 1.5–6.5)
NEUT%: 70.5 % (ref 39.0–75.0)
Platelets: 142 10*3/uL (ref 140–400)
RBC: 2.61 10*6/uL — ABNORMAL LOW (ref 4.20–5.82)
RDW: 17.3 % — ABNORMAL HIGH (ref 11.0–14.6)
WBC: 4.1 10*3/uL (ref 4.0–10.3)
lymph#: 0.5 10*3/uL — ABNORMAL LOW (ref 0.9–3.3)

## 2014-05-13 LAB — ABO/RH: ABO/RH(D): A POS

## 2014-05-13 MED ORDER — METHYLPREDNISOLONE (PAK) 4 MG PO TABS: ORAL_TABLET | ORAL | Status: DC

## 2014-05-13 MED ORDER — FLUCONAZOLE 100 MG PO TABS: 100.0000 mg | ORAL_TABLET | Freq: Every day | ORAL | Status: DC

## 2014-05-13 NOTE — Telephone Encounter (Signed)
gave pt avs report and appts for feb/march. per desk nurse pt sent back to lab today for blood transfusion Friday.

## 2014-05-13 NOTE — Progress Notes (Signed)
[facility]  [phone] [phone]  OFFICE VISIT PROGRESS NOTE  [name], [place] [address]  DIAGNOSIS: Recurrent non-small cell lung cancer initially diagnosed as Stage IIIA (T1b, N2, M0) non-small cell lung cancer, adenocarcinoma with negative EGFR mutation and negative ALK gene translocation  PRIOR THERAPY:  1) Concurrent chemoradiation with weekly carboplatin for an AUC of 2 and paclitaxel at 45 mg per meter squared concurrent with radiation therapy under the care of Dr. Lisbeth Renshaw. He status post 7 cycles of chemotherapy. Last dose was given 02/11/2013 with partial response. 2) Systemic chemotherapy with carboplatin for AUC of 5 and Alimta 500 mg/M2 every 3 weeks. First dose on 01/07/2014. Status post 6 cycles. Last dose was given 04/22/2014 with stable disease.  CURRENT THERAPY: Maintenance chemotherapy with single agent Alimta 500 MG/M2 every 3 weeks. First dose on 05/27/2014.  DISEASE STAGE: Metastatic non-small cell lung cancer   CHEMOTHERAPY INTENT: Palliative.  CURRENT # OF CHEMOTHERAPY CYCLES: 1  CURRENT ANTIEMETICS: Zofran, dexamethasone, and Compazine for at home use  CURRENT SMOKING STATUS: Former smoker quit 12/20/1992  ORAL CHEMOTHERAPY AND CONSENT: n/a  CURRENT BISPHOSPHONATES USE: None  PAIN MANAGEMENT: Dilaudid  NARCOTICS INDUCED CONSTIPATION: None  LIVING WILL AND CODE STATUS: ?  INTERVAL HISTORY: Christopher Roach 81 y.o. male returns for followup visit accompanied by 2 daughters and a family friend. He completed 6 cycles of systemic chemotherapy with carboplatin and Alimta fairly well. He recently had 2-3 episodes of blacking out and fall. He also continues to have mild pain on the left chest wall and shoulder area. The patient denied having any nausea or vomiting. He denied having any fever or chills. He had repeat CT scan of the chest performed recently and is here for evaluation and  discussion of his scan results. He is here to start the sixth cycle of his systemic chemotherapy.  MEDICAL HISTORY: Past Medical History  Diagnosis Date  . Hypertension   . Hyperlipidemia   . Hypothyroidism   . Abnormal LFTs   . Gallbladder polyp   . BPH (benign prostatic hyperplasia)   . Actinic keratosis   . Lung nodule     benign BX 03/24/2008  . Anemia   . H/O asbestos exposure   . Pneumonia     hx of walking PNA  . Shortness of breath     with exertion  . Constipation     from medications  . Arthritis   . Kidney stone August 2014    hx of  . Birth mark     left arm and shoulder; Dark purple on upper L chest ;NOT A RASH    ALLERGIES:  is allergic to codeine.  MEDICATIONS:  Current Outpatient Prescriptions  Medication Sig Dispense Refill  . ALPRAZolam (XANAX) 1 MG tablet   0  . aspirin EC 81 MG tablet Take 81 mg by mouth daily.    . Eszopiclone (ESZOPICLONE) 3 MG TABS Take 3 mg by mouth at bedtime. Take immediately before bedtime    . folic acid (FOLVITE) 1 MG tablet Take 1 tablet (1 mg total) by mouth daily. 30 tablet 4  . levothyroxine (SYNTHROID, LEVOTHROID) 150 MCG tablet Take 150 mcg by mouth daily.  0  . tamsulosin (FLOMAX) 0.4 MG CAPS Take 0.4 mg by mouth at bedtime.     . traZODone (DESYREL) 50 MG tablet 1 tablet at bedtime as needed.  0  . amLODipine (NORVASC) 10 MG tablet Take 10  mg by mouth at bedtime.     . benzonatate (TESSALON) 200 MG capsule     . chlorproMAZINE (THORAZINE) 25 MG tablet Take 25 mg by mouth 3 (three) times daily as needed.    Marland Kitchen dexamethasone (DECADRON) 4 MG tablet 4 mg by mouth twice a day the day before, day of and day after the chemotherapy every 3 weeks. (Patient not taking: Reported on 05/13/2014) 40 tablet 1  . fluconazole (DIFLUCAN) 100 MG tablet Take 1 tablet (100 mg total) by mouth daily. (Patient not taking: Reported on 05/13/2014) 10 tablet 0  . prochlorperazine (COMPAZINE) 10 MG tablet Take 1 tablet (10 mg total) by mouth every 6  (six) hours as needed for nausea or vomiting. (Patient not taking: Reported on 05/13/2014) 30 tablet 0  . traMADol (ULTRAM) 50 MG tablet Take 1 tablet (50 mg total) by mouth every 6 (six) hours as needed. (Patient not taking: Reported on 05/13/2014) 30 tablet 0   No current facility-administered medications for this visit.    SURGICAL HISTORY:  Past Surgical History  Procedure Laterality Date  . Back surgery      Dr Collie Siad 1978 and 1982  . Cervical disc surgery    . Right foot surgery      plantar fascitis  . Right shoulder surgery      torn rotator cuff  . Right ankle fracture         . Right wrist fracture    . Foot surgery Left   . Tonsillectomy    . Appendectomy    . Colonoscopy    . Flexible bronchoscopy N/A 12/07/2012    Procedure: FLEXIBLE BRONCHOSCOPY;  Surgeon: Gaye Pollack, MD;  Location: [facility] OR;  Service: Thoracic;  Laterality: N/A;  . Video assisted thoracoscopy (vats)/thorocotomy Left 12/07/2012    Procedure: VIDEO ASSISTED THORACOSCOPY (VATS)/ EXPLORATORY THOROCOTOMY;  Surgeon: Gaye Pollack, MD;  Location: [facility] OR;  Service: Thoracic;  Laterality: Left;    REVIEW OF SYSTEMS:  Constitutional: positive for fatigue Eyes: negative Ears, nose, mouth, throat, and face: negative Respiratory: positive for cough, dyspnea on exertion and pleurisy/chest pain Cardiovascular: negative Gastrointestinal: negative Genitourinary:negative Integument/breast: negative Hematologic/lymphatic: negative Musculoskeletal:negative Neurological: negative Behavioral/Psych: negative Endocrine: negative Allergic/Immunologic: negative   PHYSICAL EXAMINATION: General appearance: alert, cooperative, appears stated age and no distress Head: Normocephalic, without obvious abnormality, atraumatic Neck: no adenopathy, no JVD, supple, symmetrical, trachea midline and thyroid not enlarged, symmetric, no tenderness/mass/nodules Lymph nodes: Cervical, supraclavicular, and axillary nodes normal. Resp:  diminished breath sounds LUL and dullness to percussion LUL Cardio: regular rate and rhythm, S1, S2 normal, no murmur, click, rub or gallop GI: soft, non-tender; bowel sounds normal; no masses,  no organomegaly Extremities: extremities normal, atraumatic, no cyanosis or edema Neurologic: Alert and oriented X 3, normal strength and tone. Normal symmetric reflexes. Normal coordination and gait  ECOG PERFORMANCE STATUS: 1 - Symptomatic but completely ambulatory  Blood pressure 114/58, pulse 81, temperature 98.2 F (36.8 C), temperature source Oral, resp. rate 18, height $RemoveBe'5\' 8"'[id number]$  (1.727 m), weight 164 lb 4.8 oz (74.526 kg), SpO2 97 %.  LABORATORY DATA: Lab Results  Component Value Date   WBC 4.1 05/13/2014   HGB 8.1* 05/13/2014   HCT 26.2* 05/13/2014   MCV 100.4* 05/13/2014   PLT 142 05/13/2014      Chemistry      Component Value Date/Time   NA 142 05/06/2014 1343   NA 135* 01/10/2014 1446   K 4.5 05/06/2014 1343   K 4.3 01/10/2014  1446   CL 98 01/10/2014 1446   CO2 26 05/06/2014 1343   CO2 24 01/10/2014 1446   BUN 15.1 05/06/2014 1343   BUN 20 01/10/2014 1446   CREATININE 1.0 05/06/2014 1343   CREATININE 0.90 01/10/2014 1446      Component Value Date/Time   CALCIUM 8.9 05/06/2014 1343   CALCIUM 8.7 01/10/2014 1446   ALKPHOS 167* 05/06/2014 1343   ALKPHOS 129* 12/05/2012 1348   AST 32 05/06/2014 1343   AST 44* 12/05/2012 1348   ALT 26 05/06/2014 1343   ALT 35 12/05/2012 1348   BILITOT 0.59 05/06/2014 1343   BILITOT 0.5 12/05/2012 1348       RADIOGRAPHIC STUDIES: Ct Chest W Contrast  05/12/2014   CLINICAL DATA:  Left lung cancer, ongoing chemotherapy. Radiation therapy complete. Shortness of breath and cough.  EXAM: CT CHEST, ABDOMEN, AND PELVIS WITH CONTRAST  TECHNIQUE: Multidetector CT imaging of the chest, abdomen and pelvis was performed following the standard protocol during bolus administration of intravenous contrast.  CONTRAST:  125mL OMNIPAQUE IOHEXOL 300 MG/ML   SOLN  COMPARISON:  CT chest 03/10/2014 and PET 01/01/2014. CT chest abdomen pelvis 10/23/2012.  FINDINGS: CT CHEST FINDINGS  CT CHEST FINDINGS  Mediastinum/Nodes: Left anterior scalene/left supraclavicular adenopathy measures up to 1.3 cm in short axis, likely stable. Mediastinal lymph nodes are not enlarged by CT size criteria. Left hilar region is difficult to evaluate but there are rounded structures, suspicious for adenopathy (series 2, image 26), as before. Heart is at the upper limits of normal in size. No pericardial effusion. Pre pericardiac lymph nodes measure up to 7 mm, stable.  Lungs/Pleura: Masslike low-attenuation in the upper aspect of the left hemi thorax measures approximately 3.0 x 6.1 cm, likely stable. Surrounding areas of consolidation, scarring and volume loss in the left hemi thorax, as before. Scattered nodules in the left lung measure up to 6 mm (image 30), as before. Calcified pleural plaques are seen bilaterally. Micro nodularity in the subpleural right lower lobe is unchanged. Left upper lobe bronchus is severely narrowed, as before.  Musculoskeletal: No worrisome lytic or sclerotic lesions.  CT ABDOMEN AND PELVIS FINDINGS  Hepatobiliary: Liver margin appears slightly irregular. Recanalized paraumbilical vein. Gallbladder is decompressed and has a tiny stone within. No biliary ductal dilatation.  Pancreas: Negative.  Spleen: 4 mm low-attenuation lesion in the spleen is unchanged and nonspecific.  Adrenals/Urinary Tract: Right adrenal gland is unremarkable. Low-density nodule in the left adrenal gland measures approximately 1.0 x 1.6 cm, stable. Low-attenuation lesions in the kidneys measure up to 4.3 cm on the right and statistically, likely represent cysts. Ureters are decompressed. Bladder is low in volume.  Stomach/Bowel: Stomach, small bowel and colon are unremarkable. Incidental note is made of a duodenal diverticulum. Appendix is not readily visualized.  Vascular/Lymphatic:  Atherosclerotic calcification of the arterial vasculature without abdominal aortic aneurysm. No pathologically enlarged lymph nodes.  Reproductive: Prostate is enlarged, measuring approximately 6.0 x 7.1 cm, and indents the bladder base.  Other: No free fluid.  Mesenteries and peritoneum are unremarkable.  Musculoskeletal: No worrisome lytic or sclerotic lesions. Mild T8 compression deformity is unchanged from 03/10/2014.  IMPRESSION: 1. Left upper lobe mass with left hilar and left anterior scalene/supraclavicular adenopathy, grossly stable. Surrounding collapse/consolidation and radiation change in the left lung, as before. 2. Subpleural micro nodularity in the right lower lobe may represent mild fibrosis. In association with calcified pleural plaques, asbestosis is considered. 3. Liver appears cirrhotic. 4. Cholelithiasis. 5. Left adrenal nodule  stable and likely an adenoma. 6. Enlarged prostate.   Electronically Signed   By: Lorin Picket M.D.   On: 05/12/2014 15:09   Ct Abdomen Pelvis W Contrast  05/12/2014   CLINICAL DATA:  Left lung cancer, ongoing chemotherapy. Radiation therapy complete. Shortness of breath and cough.  EXAM: CT CHEST, ABDOMEN, AND PELVIS WITH CONTRAST  TECHNIQUE: Multidetector CT imaging of the chest, abdomen and pelvis was performed following the standard protocol during bolus administration of intravenous contrast.  CONTRAST:  158mL OMNIPAQUE IOHEXOL 300 MG/ML  SOLN  COMPARISON:  CT chest 03/10/2014 and PET 01/01/2014. CT chest abdomen pelvis 10/23/2012.  FINDINGS: CT CHEST FINDINGS  CT CHEST FINDINGS  Mediastinum/Nodes: Left anterior scalene/left supraclavicular adenopathy measures up to 1.3 cm in short axis, likely stable. Mediastinal lymph nodes are not enlarged by CT size criteria. Left hilar region is difficult to evaluate but there are rounded structures, suspicious for adenopathy (series 2, image 26), as before. Heart is at the upper limits of normal in size. No pericardial  effusion. Pre pericardiac lymph nodes measure up to 7 mm, stable.  Lungs/Pleura: Masslike low-attenuation in the upper aspect of the left hemi thorax measures approximately 3.0 x 6.1 cm, likely stable. Surrounding areas of consolidation, scarring and volume loss in the left hemi thorax, as before. Scattered nodules in the left lung measure up to 6 mm (image 30), as before. Calcified pleural plaques are seen bilaterally. Micro nodularity in the subpleural right lower lobe is unchanged. Left upper lobe bronchus is severely narrowed, as before.  Musculoskeletal: No worrisome lytic or sclerotic lesions.  CT ABDOMEN AND PELVIS FINDINGS  Hepatobiliary: Liver margin appears slightly irregular. Recanalized paraumbilical vein. Gallbladder is decompressed and has a tiny stone within. No biliary ductal dilatation.  Pancreas: Negative.  Spleen: 4 mm low-attenuation lesion in the spleen is unchanged and nonspecific.  Adrenals/Urinary Tract: Right adrenal gland is unremarkable. Low-density nodule in the left adrenal gland measures approximately 1.0 x 1.6 cm, stable. Low-attenuation lesions in the kidneys measure up to 4.3 cm on the right and statistically, likely represent cysts. Ureters are decompressed. Bladder is low in volume.  Stomach/Bowel: Stomach, small bowel and colon are unremarkable. Incidental note is made of a duodenal diverticulum. Appendix is not readily visualized.  Vascular/Lymphatic: Atherosclerotic calcification of the arterial vasculature without abdominal aortic aneurysm. No pathologically enlarged lymph nodes.  Reproductive: Prostate is enlarged, measuring approximately 6.0 x 7.1 cm, and indents the bladder base.  Other: No free fluid.  Mesenteries and peritoneum are unremarkable.  Musculoskeletal: No worrisome lytic or sclerotic lesions. Mild T8 compression deformity is unchanged from 03/10/2014.  IMPRESSION: 1. Left upper lobe mass with left hilar and left anterior scalene/supraclavicular adenopathy,  grossly stable. Surrounding collapse/consolidation and radiation change in the left lung, as before. 2. Subpleural micro nodularity in the right lower lobe may represent mild fibrosis. In association with calcified pleural plaques, asbestosis is considered. 3. Liver appears cirrhotic. 4. Cholelithiasis. 5. Left adrenal nodule stable and likely an adenoma. 6. Enlarged prostate.   Electronically Signed   By: Lorin Picket M.D.   On: 05/12/2014 15:09   ASSESSMENT/PLAN: This is a very pleasant 81 years old white male recently diagnosed with a stage IIIa non-small cell lung cancer currently undergoing concurrent chemoradiation with weekly carboplatin and paclitaxel status post 7 cycles.  He is currently on treatment with systemic chemotherapy with carboplatin and Alimta status post 6 cycles for recurrent disease. The recent CT scan of the chest, abdomen and pelvis showed no  evidence for disease progression. I discussed the scan results and showed the images to the patient and his family. I discussed with him the option of maintenance chemotherapy with single agent Alimta every 3 weeks versus observation and close monitoring. The patient is interested in proceeding with maintenance chemotherapy. He is expected to start the first cycle of this treatment on 05/27/2014. For the lack of appetite, I will start the patient on Medrol Dosepak. For the oral thrush, I will start the patient on Diflucan 100 mg by mouth daily for 10 days. For the chemotherapy-induced anemia, I will arrange for the patient to receive 2 units of PRBCs transfusion this week. He would come back for follow-up visit in 2 weeks for reevaluation before starting the first cycle of his maintenance therapy. The patient was advised to call immediately if he has any concerning symptoms in the interval. All questions were answered. The patient knows to call the clinic with any problems, questions or concerns. We can certainly see the patient much  sooner if necessary.  Disclaimer: This note was dictated with voice recognition software. Similar sounding words can inadvertently be transcribed and may not be corrected upon review.   Eilleen Kempf., MD

## 2014-05-13 NOTE — Progress Notes (Signed)
Har done

## 2014-05-16 ENCOUNTER — Ambulatory Visit (HOSPITAL_BASED_OUTPATIENT_CLINIC_OR_DEPARTMENT_OTHER): Payer: Medicare Other

## 2014-05-16 VITALS — BP 152/71 | HR 76 | Temp 97.6°F | Resp 18

## 2014-05-16 DIAGNOSIS — T451X5A Adverse effect of antineoplastic and immunosuppressive drugs, initial encounter: Secondary | ICD-10-CM

## 2014-05-16 DIAGNOSIS — D6481 Anemia due to antineoplastic chemotherapy: Secondary | ICD-10-CM | POA: Diagnosis not present

## 2014-05-16 LAB — PREPARE RBC (CROSSMATCH)

## 2014-05-16 MED ORDER — SODIUM CHLORIDE 0.9 % IV SOLN
250.0000 mL | Freq: Once | INTRAVENOUS | Status: AC
  Administered 2014-05-16: 250 mL via INTRAVENOUS

## 2014-05-16 MED ORDER — DIPHENHYDRAMINE HCL 25 MG PO CAPS
25.0000 mg | ORAL_CAPSULE | Freq: Once | ORAL | Status: AC
  Administered 2014-05-16: 25 mg via ORAL

## 2014-05-16 MED ORDER — ACETAMINOPHEN 325 MG PO TABS
650.0000 mg | ORAL_TABLET | Freq: Once | ORAL | Status: AC
  Administered 2014-05-16: 650 mg via ORAL

## 2014-05-16 NOTE — Patient Instructions (Signed)

## 2014-05-17 LAB — TYPE AND SCREEN
ABO/RH(D): A POS
Antibody Screen: NEGATIVE
UNIT DIVISION: 0
Unit division: 0

## 2014-05-26 ENCOUNTER — Ambulatory Visit: Payer: Medicare Other | Admitting: Oncology

## 2014-05-26 ENCOUNTER — Other Ambulatory Visit: Payer: Medicare Other

## 2014-05-27 ENCOUNTER — Encounter: Payer: Self-pay | Admitting: Physician Assistant

## 2014-05-27 ENCOUNTER — Telehealth: Payer: Self-pay | Admitting: *Deleted

## 2014-05-27 ENCOUNTER — Ambulatory Visit (HOSPITAL_BASED_OUTPATIENT_CLINIC_OR_DEPARTMENT_OTHER): Payer: Medicare Other | Admitting: Physician Assistant

## 2014-05-27 ENCOUNTER — Telehealth: Payer: Self-pay | Admitting: Physician Assistant

## 2014-05-27 ENCOUNTER — Ambulatory Visit: Payer: Medicare Other

## 2014-05-27 ENCOUNTER — Other Ambulatory Visit (HOSPITAL_BASED_OUTPATIENT_CLINIC_OR_DEPARTMENT_OTHER): Payer: Medicare Other

## 2014-05-27 VITALS — BP 116/58 | HR 67 | Temp 98.6°F | Resp 18 | Ht 68.0 in | Wt 161.0 lb

## 2014-05-27 DIAGNOSIS — C3492 Malignant neoplasm of unspecified part of left bronchus or lung: Secondary | ICD-10-CM

## 2014-05-27 DIAGNOSIS — R499 Unspecified voice and resonance disorder: Secondary | ICD-10-CM

## 2014-05-27 DIAGNOSIS — C3412 Malignant neoplasm of upper lobe, left bronchus or lung: Secondary | ICD-10-CM

## 2014-05-27 DIAGNOSIS — M25512 Pain in left shoulder: Secondary | ICD-10-CM

## 2014-05-27 DIAGNOSIS — R0789 Other chest pain: Secondary | ICD-10-CM

## 2014-05-27 DIAGNOSIS — R490 Dysphonia: Secondary | ICD-10-CM

## 2014-05-27 LAB — COMPREHENSIVE METABOLIC PANEL (CC13)
ALT: 37 U/L (ref 0–55)
ANION GAP: 10 meq/L (ref 3–11)
AST: 44 U/L — ABNORMAL HIGH (ref 5–34)
Albumin: 2.9 g/dL — ABNORMAL LOW (ref 3.5–5.0)
Alkaline Phosphatase: 202 U/L — ABNORMAL HIGH (ref 40–150)
BUN: 12.6 mg/dL (ref 7.0–26.0)
CALCIUM: 9.4 mg/dL (ref 8.4–10.4)
CO2: 27 meq/L (ref 22–29)
Chloride: 103 mEq/L (ref 98–109)
Creatinine: 0.8 mg/dL (ref 0.7–1.3)
EGFR: 82 mL/min/{1.73_m2} — ABNORMAL LOW (ref >90)
GLUCOSE: 153 mg/dL — AB (ref 70–140)
POTASSIUM: 4.1 meq/L (ref 3.5–5.1)
Sodium: 140 mEq/L (ref 136–145)
TOTAL PROTEIN: 7.2 g/dL (ref 6.4–8.3)
Total Bilirubin: 0.93 mg/dL (ref 0.20–1.20)

## 2014-05-27 LAB — CBC WITH DIFFERENTIAL/PLATELET
BASO%: 1.4 % (ref 0.0–2.0)
Basophils Absolute: 0.1 10*3/uL (ref 0.0–0.1)
EOS ABS: 0.1 10*3/uL (ref 0.0–0.5)
EOS%: 1 % (ref 0.0–7.0)
HCT: 33.9 % — ABNORMAL LOW (ref 38.4–49.9)
HGB: 10.7 g/dL — ABNORMAL LOW (ref 13.0–17.1)
LYMPH%: 9.9 % — ABNORMAL LOW (ref 14.0–49.0)
MCH: 30.5 pg (ref 27.2–33.4)
MCHC: 31.6 g/dL — ABNORMAL LOW (ref 32.0–36.0)
MCV: 96.5 fL (ref 79.3–98.0)
MONO#: 1 10*3/uL — ABNORMAL HIGH (ref 0.1–0.9)
MONO%: 12 % (ref 0.0–14.0)
NEUT#: 6.1 10*3/uL (ref 1.5–6.5)
NEUT%: 75.7 % — ABNORMAL HIGH (ref 39.0–75.0)
Platelets: 150 10*3/uL (ref 140–400)
RBC: 3.52 10*6/uL — ABNORMAL LOW (ref 4.20–5.82)
RDW: 16.5 % — ABNORMAL HIGH (ref 11.0–14.6)
WBC: 8.1 10*3/uL (ref 4.0–10.3)
lymph#: 0.8 10*3/uL — ABNORMAL LOW (ref 0.9–3.3)

## 2014-05-27 MED ORDER — TRAMADOL HCL 50 MG PO TABS: 50.0000 mg | ORAL_TABLET | Freq: Four times a day (QID) | ORAL | Status: DC | PRN

## 2014-05-27 NOTE — Telephone Encounter (Signed)
Per staff message and POF I have scheduled appts. Advised scheduler of appts. JMW  

## 2014-05-27 NOTE — Telephone Encounter (Signed)
Gave avs & calendar for February/March Sent message to schedule treatment.See referral treatment

## 2014-05-27 NOTE — Progress Notes (Addendum)
[facility]  [phone] [phone]  OFFICE VISIT PROGRESS NOTE  Christopher Roach, [place] [address]  DIAGNOSIS: Recurrent non-small cell lung cancer initially diagnosed as Stage IIIA (T1b, N2, M0) non-small cell lung cancer, adenocarcinoma with negative EGFR mutation and negative ALK gene translocation  PRIOR THERAPY:  1) Concurrent chemoradiation with weekly carboplatin for an AUC of 2 and paclitaxel at 45 mg per meter squared concurrent with radiation therapy under the care of Dr. Lisbeth Renshaw. He status post 7 cycles of chemotherapy. Last dose was given 02/11/2013 with partial response. 2) Systemic chemotherapy with carboplatin for AUC of 5 and Alimta 500 mg/M2 every 3 weeks. First dose on 01/07/2014. Status post 6 cycles. Last dose was given 04/22/2014 with stable disease.  CURRENT THERAPY: Maintenance chemotherapy with single agent Alimta 500 MG/M2 every 3 weeks. First dose on 05/27/2014.  DISEASE STAGE: Metastatic non-small cell lung cancer   CHEMOTHERAPY INTENT: Palliative.  CURRENT # OF CHEMOTHERAPY CYCLES: 1  CURRENT ANTIEMETICS: Zofran, dexamethasone, and Compazine for at home use  CURRENT SMOKING STATUS: Former smoker quit 12/20/1992  ORAL CHEMOTHERAPY AND CONSENT: n/a  CURRENT BISPHOSPHONATES USE: None  PAIN MANAGEMENT: Dilaudid  NARCOTICS INDUCED CONSTIPATION: None  LIVING WILL AND CODE STATUS: ?  INTERVAL HISTORY: Christopher Roach 81 y.o. male returns for followup visit accompanied by 2 daughters and a family friend. He completed 6 cycles of systemic chemotherapy with carboplatin and Alimta fairly well. He reports a cough that started on Sunday. The cough is productive of clear to white secretions, not associated with fever or chills. He reports that he lost his voice.He has had progressive voice hoarseness/weakness over the past 3 -4 weeks. He reports the steroids helped with his appetite but he has lost  about 3 pounds since his last office visit. He requests a refill for his tramadol that well manages his pain.  He continues to have mild pain on the left chest wall and shoulder area. The patient denied having any nausea or vomiting. He denied having any fever or chills. He presents to start cycle #1 of his maintenance chemotherapy with single agent Alimta. He states that he does not feel up to proceeding with chemotherapy today.   MEDICAL HISTORY: Past Medical History  Diagnosis Date  . Hypertension   . Hyperlipidemia   . Hypothyroidism   . Abnormal LFTs   . Gallbladder polyp   . BPH (benign prostatic hyperplasia)   . Actinic keratosis   . Lung nodule     benign BX 03/24/2008  . Anemia   . H/O asbestos exposure   . Pneumonia     hx of walking PNA  . Shortness of breath     with exertion  . Constipation     from medications  . Arthritis   . Kidney stone August 2014    hx of  . Birth mark     left arm and shoulder; Dark purple on upper L chest ;NOT A RASH    ALLERGIES:  is allergic to codeine.  MEDICATIONS:  Current Outpatient Prescriptions  Medication Sig Dispense Refill  . ALPRAZolam (XANAX) 1 MG tablet   0  . aspirin EC 81 MG tablet Take 81 mg by mouth daily.    . benzonatate (TESSALON) 200 MG capsule     . chlorproMAZINE (THORAZINE) 25 MG tablet Take 25 mg by mouth 3 (three) times daily as needed.    . Eszopiclone (ESZOPICLONE) 3 MG TABS Take 3  mg by mouth at bedtime. Take immediately before bedtime    . fluconazole (DIFLUCAN) 100 MG tablet Take 1 tablet (100 mg total) by mouth daily. 10 tablet 0  . levothyroxine (SYNTHROID, LEVOTHROID) 150 MCG tablet Take 150 mcg by mouth daily.  0  . prochlorperazine (COMPAZINE) 10 MG tablet Take 1 tablet (10 mg total) by mouth every 6 (six) hours as needed for nausea or vomiting. 30 tablet 0  . tamsulosin (FLOMAX) 0.4 MG CAPS Take 0.4 mg by mouth at bedtime.     . traMADol (ULTRAM) 50 MG tablet Take 1 tablet (50 mg total) by mouth  every 6 (six) hours as needed. 30 tablet 0  . traZODone (DESYREL) 50 MG tablet 1 tablet at bedtime as needed.  0  . amLODipine (NORVASC) 10 MG tablet Take 10 mg by mouth at bedtime.      No current facility-administered medications for this visit.    SURGICAL HISTORY:  Past Surgical History  Procedure Laterality Date  . Back surgery      Dr Collie Siad 1978 and 1982  . Cervical disc surgery    . Right foot surgery      plantar fascitis  . Right shoulder surgery      torn rotator cuff  . Right ankle fracture         . Right wrist fracture    . Foot surgery Left   . Tonsillectomy    . Appendectomy    . Colonoscopy    . Flexible bronchoscopy N/A 12/07/2012    Procedure: FLEXIBLE BRONCHOSCOPY;  Surgeon: Gaye Pollack, MD;  Location: [facility] OR;  Service: Thoracic;  Laterality: N/A;  . Video assisted thoracoscopy (vats)/thorocotomy Left 12/07/2012    Procedure: VIDEO ASSISTED THORACOSCOPY (VATS)/ EXPLORATORY THOROCOTOMY;  Surgeon: Gaye Pollack, MD;  Location: [facility] OR;  Service: Thoracic;  Laterality: Left;    REVIEW OF SYSTEMS:  Constitutional: positive for fatigue and malaise Eyes: negative Ears, nose, mouth, throat, and face: positive for voice change Respiratory: positive for cough, dyspnea on exertion and pleurisy/chest pain Cardiovascular: negative Gastrointestinal: negative Genitourinary:negative Integument/breast: negative Hematologic/lymphatic: negative Musculoskeletal:negative Neurological: negative Behavioral/Psych: negative Endocrine: negative Allergic/Immunologic: negative   PHYSICAL EXAMINATION: General appearance: alert, cooperative, appears stated age, no distress and voice is "thin" and hoarse Head: Normocephalic, without obvious abnormality, atraumatic Neck: no adenopathy, no JVD, supple, symmetrical, trachea midline and thyroid not enlarged, symmetric, no tenderness/mass/nodules Lymph nodes: Cervical, supraclavicular, and axillary nodes normal. Resp: diminished breath  sounds LUL and dullness to percussion LUL Cardio: regular rate and rhythm, S1, S2 normal, no murmur, click, rub or gallop GI: soft, non-tender; bowel sounds normal; no masses,  no organomegaly Extremities: extremities normal, atraumatic, no cyanosis or edema Neurologic: Alert and oriented X 3, normal strength and tone. Normal symmetric reflexes. Normal coordination and gait  ECOG PERFORMANCE STATUS: 1 - Symptomatic but completely ambulatory  Blood pressure 116/58, pulse 67, temperature 98.6 F (37 C), temperature source Oral, resp. rate 18, height $RemoveBe'5\' 8"'gRXqYaRtr$  (1.727 m), weight 161 lb (73.029 kg), SpO2 97 %.  LABORATORY DATA: Lab Results  Component Value Date   WBC 8.1 05/27/2014   HGB 10.7* 05/27/2014   HCT 33.9* 05/27/2014   MCV 96.5 05/27/2014   PLT 150 05/27/2014      Chemistry      Component Value Date/Time   NA 140 05/27/2014 1310   NA 135* 01/10/2014 1446   K 4.1 05/27/2014 1310   K 4.3 01/10/2014 1446   CL 98 01/10/2014 1446  CO2 27 05/27/2014 1310   CO2 24 01/10/2014 1446   BUN 12.6 05/27/2014 1310   BUN 20 01/10/2014 1446   CREATININE 0.8 05/27/2014 1310   CREATININE 0.90 01/10/2014 1446      Component Value Date/Time   CALCIUM 9.4 05/27/2014 1310   CALCIUM 8.7 01/10/2014 1446   ALKPHOS 202* 05/27/2014 1310   ALKPHOS 129* 12/05/2012 1348   AST 44* 05/27/2014 1310   AST 44* 12/05/2012 1348   ALT 37 05/27/2014 1310   ALT 35 12/05/2012 1348   BILITOT 0.93 05/27/2014 1310   BILITOT 0.5 12/05/2012 1348       RADIOGRAPHIC STUDIES: Ct Chest W Contrast  05/12/2014   CLINICAL DATA:  Left lung cancer, ongoing chemotherapy. Radiation therapy complete. Shortness of breath and cough.  EXAM: CT CHEST, ABDOMEN, AND PELVIS WITH CONTRAST  TECHNIQUE: Multidetector CT imaging of the chest, abdomen and pelvis was performed following the standard protocol during bolus administration of intravenous contrast.  CONTRAST:  137mL OMNIPAQUE IOHEXOL 300 MG/ML  SOLN  COMPARISON:  CT chest  03/10/2014 and PET 01/01/2014. CT chest abdomen pelvis 10/23/2012.  FINDINGS: CT CHEST FINDINGS  CT CHEST FINDINGS  Mediastinum/Nodes: Left anterior scalene/left supraclavicular adenopathy measures up to 1.3 cm in short axis, likely stable. Mediastinal lymph nodes are not enlarged by CT size criteria. Left hilar region is difficult to evaluate but there are rounded structures, suspicious for adenopathy (series 2, image 26), as before. Heart is at the upper limits of normal in size. No pericardial effusion. Pre pericardiac lymph nodes measure up to 7 mm, stable.  Lungs/Pleura: Masslike low-attenuation in the upper aspect of the left hemi thorax measures approximately 3.0 x 6.1 cm, likely stable. Surrounding areas of consolidation, scarring and volume loss in the left hemi thorax, as before. Scattered nodules in the left lung measure up to 6 mm (image 30), as before. Calcified pleural plaques are seen bilaterally. Micro nodularity in the subpleural right lower lobe is unchanged. Left upper lobe bronchus is severely narrowed, as before.  Musculoskeletal: No worrisome lytic or sclerotic lesions.  CT ABDOMEN AND PELVIS FINDINGS  Hepatobiliary: Liver margin appears slightly irregular. Recanalized paraumbilical vein. Gallbladder is decompressed and has a tiny stone within. No biliary ductal dilatation.  Pancreas: Negative.  Spleen: 4 mm low-attenuation lesion in the spleen is unchanged and nonspecific.  Adrenals/Urinary Tract: Right adrenal gland is unremarkable. Low-density nodule in the left adrenal gland measures approximately 1.0 x 1.6 cm, stable. Low-attenuation lesions in the kidneys measure up to 4.3 cm on the right and statistically, likely represent cysts. Ureters are decompressed. Bladder is low in volume.  Stomach/Bowel: Stomach, small bowel and colon are unremarkable. Incidental note is made of a duodenal diverticulum. Appendix is not readily visualized.  Vascular/Lymphatic: Atherosclerotic calcification of the  arterial vasculature without abdominal aortic aneurysm. No pathologically enlarged lymph nodes.  Reproductive: Prostate is enlarged, measuring approximately 6.0 x 7.1 cm, and indents the bladder base.  Other: No free fluid.  Mesenteries and peritoneum are unremarkable.  Musculoskeletal: No worrisome lytic or sclerotic lesions. Mild T8 compression deformity is unchanged from 03/10/2014.  IMPRESSION: 1. Left upper lobe mass with left hilar and left anterior scalene/supraclavicular adenopathy, grossly stable. Surrounding collapse/consolidation and radiation change in the left lung, as before. 2. Subpleural micro nodularity in the right lower lobe may represent mild fibrosis. In association with calcified pleural plaques, asbestosis is considered. 3. Liver appears cirrhotic. 4. Cholelithiasis. 5. Left adrenal nodule stable and likely an adenoma. 6. Enlarged prostate.  Electronically Signed   By: Lorin Picket M.D.   On: 05/12/2014 15:09   Ct Abdomen Pelvis W Contrast  05/12/2014   CLINICAL DATA:  Left lung cancer, ongoing chemotherapy. Radiation therapy complete. Shortness of breath and cough.  EXAM: CT CHEST, ABDOMEN, AND PELVIS WITH CONTRAST  TECHNIQUE: Multidetector CT imaging of the chest, abdomen and pelvis was performed following the standard protocol during bolus administration of intravenous contrast.  CONTRAST:  135mL OMNIPAQUE IOHEXOL 300 MG/ML  SOLN  COMPARISON:  CT chest 03/10/2014 and PET 01/01/2014. CT chest abdomen pelvis 10/23/2012.  FINDINGS: CT CHEST FINDINGS  CT CHEST FINDINGS  Mediastinum/Nodes: Left anterior scalene/left supraclavicular adenopathy measures up to 1.3 cm in short axis, likely stable. Mediastinal lymph nodes are not enlarged by CT size criteria. Left hilar region is difficult to evaluate but there are rounded structures, suspicious for adenopathy (series 2, image 26), as before. Heart is at the upper limits of normal in size. No pericardial effusion. Pre pericardiac lymph nodes  measure up to 7 mm, stable.  Lungs/Pleura: Masslike low-attenuation in the upper aspect of the left hemi thorax measures approximately 3.0 x 6.1 cm, likely stable. Surrounding areas of consolidation, scarring and volume loss in the left hemi thorax, as before. Scattered nodules in the left lung measure up to 6 mm (image 30), as before. Calcified pleural plaques are seen bilaterally. Micro nodularity in the subpleural right lower lobe is unchanged. Left upper lobe bronchus is severely narrowed, as before.  Musculoskeletal: No worrisome lytic or sclerotic lesions.  CT ABDOMEN AND PELVIS FINDINGS  Hepatobiliary: Liver margin appears slightly irregular. Recanalized paraumbilical vein. Gallbladder is decompressed and has a tiny stone within. No biliary ductal dilatation.  Pancreas: Negative.  Spleen: 4 mm low-attenuation lesion in the spleen is unchanged and nonspecific.  Adrenals/Urinary Tract: Right adrenal gland is unremarkable. Low-density nodule in the left adrenal gland measures approximately 1.0 x 1.6 cm, stable. Low-attenuation lesions in the kidneys measure up to 4.3 cm on the right and statistically, likely represent cysts. Ureters are decompressed. Bladder is low in volume.  Stomach/Bowel: Stomach, small bowel and colon are unremarkable. Incidental note is made of a duodenal diverticulum. Appendix is not readily visualized.  Vascular/Lymphatic: Atherosclerotic calcification of the arterial vasculature without abdominal aortic aneurysm. No pathologically enlarged lymph nodes.  Reproductive: Prostate is enlarged, measuring approximately 6.0 x 7.1 cm, and indents the bladder base.  Other: No free fluid.  Mesenteries and peritoneum are unremarkable.  Musculoskeletal: No worrisome lytic or sclerotic lesions. Mild T8 compression deformity is unchanged from 03/10/2014.  IMPRESSION: 1. Left upper lobe mass with left hilar and left anterior scalene/supraclavicular adenopathy, grossly stable. Surrounding  collapse/consolidation and radiation change in the left lung, as before. 2. Subpleural micro nodularity in the right lower lobe may represent mild fibrosis. In association with calcified pleural plaques, asbestosis is considered. 3. Liver appears cirrhotic. 4. Cholelithiasis. 5. Left adrenal nodule stable and likely an adenoma. 6. Enlarged prostate.   Electronically Signed   By: Lorin Picket M.D.   On: 05/12/2014 15:09   ASSESSMENT/PLAN: This is a very pleasant 81 years old white male recently diagnosed with a stage IIIa non-small cell lung cancer currently undergoing concurrent chemoradiation with weekly carboplatin and paclitaxel status post 7 cycles.  He is currently on treatment with systemic chemotherapy with carboplatin and Alimta status post 6 cycles for recurrent disease. The recent CT scan of the chest, abdomen and pelvis showed no evidence for disease progression. The patient was discussed with and  also seen by Dr. Julien Nordmann. As he is not quite feeling up to it we'll postpone the start of his first cycle of maintenance chemotherapy with single agent Alimta 500 mg/m given every 3 weeks, by 1 week. We will refer him to your nose and throat specialist for evaluation of his change in voice. This may be related to possible compression of the nerves and/or vocal cords from his tumor. He'll follow-up in 4 weeks prior to proceeding with cycle #2 of this maintenance chemotherapy with single agent Alimta. The patient and his family members are in agreement with this plan. He was given a refill prescription for his tramadol tablets. The patient was advised to call immediately if he has any concerning symptoms in the interval. All questions were answered. The patient knows to call the clinic with any problems, questions or concerns. We can certainly see the patient much sooner if necessary.  Disclaimer: This note was dictated with voice recognition software. Similar sounding words can inadvertently be  transcribed and may not be corrected upon review.   Christopher Adam, PA-C  05/27/2014  ADDENDUM: Hematology/Oncology Attending: I had a face to face encounter with the patient. I recommended his care plan. This is a very pleasant 81 years old white male with recurrent non-small cell lung cancer, adenocarcinoma most recently completed 6 cycles of systemic chemotherapy with carboplatin and Alimta. His recent CT scan of the chest showed no evidence for disease progression. The patient continues to complain of hoarseness of his voice suspicious to be secondary to his malignancy. I discussed with the patient has treatment options including maintenance chemotherapy with single agent Alimta versus observation and close monitoring. The patient and his family were interested in proceeding with the maintenance therapy. We will delay the start of cycle #1 until next week to give the patient time to be evaluated for the hoarseness of his various by ENT. For pain management he was given a refill of tramadol. He was advised to call immediately if he has any concerning symptoms in the interval.  Disclaimer: This note was dictated with voice recognition software. Similar sounding words can inadvertently be transcribed and may be missed upon review. Eilleen Kempf., MD 06/02/2014

## 2014-06-02 ENCOUNTER — Encounter: Payer: Self-pay | Admitting: Nurse Practitioner

## 2014-06-02 ENCOUNTER — Other Ambulatory Visit (HOSPITAL_BASED_OUTPATIENT_CLINIC_OR_DEPARTMENT_OTHER): Payer: Medicare Other | Admitting: Medical Oncology

## 2014-06-02 ENCOUNTER — Ambulatory Visit (HOSPITAL_BASED_OUTPATIENT_CLINIC_OR_DEPARTMENT_OTHER): Payer: Medicare Other | Admitting: Nurse Practitioner

## 2014-06-02 ENCOUNTER — Other Ambulatory Visit: Payer: Medicare Other

## 2014-06-02 ENCOUNTER — Ambulatory Visit (HOSPITAL_BASED_OUTPATIENT_CLINIC_OR_DEPARTMENT_OTHER): Payer: Medicare Other

## 2014-06-02 ENCOUNTER — Telehealth: Payer: Self-pay | Admitting: Internal Medicine

## 2014-06-02 ENCOUNTER — Telehealth: Payer: Self-pay | Admitting: Medical Oncology

## 2014-06-02 VITALS — BP 120/72 | HR 109 | Temp 98.4°F | Resp 28 | Wt 157.0 lb

## 2014-06-02 DIAGNOSIS — C3492 Malignant neoplasm of unspecified part of left bronchus or lung: Secondary | ICD-10-CM

## 2014-06-02 DIAGNOSIS — R630 Anorexia: Secondary | ICD-10-CM

## 2014-06-02 DIAGNOSIS — R110 Nausea: Secondary | ICD-10-CM

## 2014-06-02 DIAGNOSIS — R770 Abnormality of albumin: Secondary | ICD-10-CM

## 2014-06-02 DIAGNOSIS — C3412 Malignant neoplasm of upper lobe, left bronchus or lung: Secondary | ICD-10-CM

## 2014-06-02 DIAGNOSIS — E860 Dehydration: Secondary | ICD-10-CM

## 2014-06-02 DIAGNOSIS — E8809 Other disorders of plasma-protein metabolism, not elsewhere classified: Secondary | ICD-10-CM

## 2014-06-02 LAB — CBC WITH DIFFERENTIAL/PLATELET
BASO%: 0.6 % (ref 0.0–2.0)
BASOS ABS: 0.1 10*3/uL (ref 0.0–0.1)
EOS%: 0.8 % (ref 0.0–7.0)
Eosinophils Absolute: 0.1 10*3/uL (ref 0.0–0.5)
HCT: 33.9 % — ABNORMAL LOW (ref 38.4–49.9)
HGB: 11 g/dL — ABNORMAL LOW (ref 13.0–17.1)
LYMPH#: 0.8 10*3/uL — AB (ref 0.9–3.3)
LYMPH%: 9.4 % — ABNORMAL LOW (ref 14.0–49.0)
MCH: 31.2 pg (ref 27.2–33.4)
MCHC: 32.4 g/dL (ref 32.0–36.0)
MCV: 96 fL (ref 79.3–98.0)
MONO#: 0.9 10*3/uL (ref 0.1–0.9)
MONO%: 9.8 % (ref 0.0–14.0)
NEUT%: 79.4 % — AB (ref 39.0–75.0)
NEUTROS ABS: 7.1 10*3/uL — AB (ref 1.5–6.5)
PLATELETS: 153 10*3/uL (ref 140–400)
RBC: 3.53 10*6/uL — AB (ref 4.20–5.82)
RDW: 15.2 % — AB (ref 11.0–14.6)
WBC: 9 10*3/uL (ref 4.0–10.3)

## 2014-06-02 LAB — COMPREHENSIVE METABOLIC PANEL (CC13)
ALK PHOS: 232 U/L — AB (ref 40–150)
ALT: 28 U/L (ref 0–55)
AST: 33 U/L (ref 5–34)
Albumin: 2.9 g/dL — ABNORMAL LOW (ref 3.5–5.0)
Anion Gap: 13 mEq/L — ABNORMAL HIGH (ref 3–11)
BUN: 21.9 mg/dL (ref 7.0–26.0)
CO2: 23 meq/L (ref 22–29)
Calcium: 9.7 mg/dL (ref 8.4–10.4)
Chloride: 104 mEq/L (ref 98–109)
Creatinine: 1.3 mg/dL (ref 0.7–1.3)
EGFR: 52 mL/min/{1.73_m2} — AB (ref >90)
Glucose: 122 mg/dl (ref 70–140)
Potassium: 4.5 mEq/L (ref 3.5–5.1)
Sodium: 140 mEq/L (ref 136–145)
Total Bilirubin: 0.82 mg/dL (ref 0.20–1.20)
Total Protein: 7.6 g/dL (ref 6.4–8.3)

## 2014-06-02 MED ORDER — SODIUM CHLORIDE 0.9 % IV SOLN
Freq: Once | INTRAVENOUS | Status: AC
  Administered 2014-06-02: 15:00:00 via INTRAVENOUS

## 2014-06-02 NOTE — Assessment & Plan Note (Signed)
Pt with c/o chronic nausea; and little appetite. States that he has had minimal oral intake within past 48 hours. Pt appears dehydrated and weak. Patient to receive one maternal saline IV fluid rehydration today while at the [facility].  He was also encouraged to push fluids is much as possible.  He may very well need to return to the [facility] this week for additional IV fluid rehydration.

## 2014-06-02 NOTE — Assessment & Plan Note (Signed)
Alkaline phosphatase remains elevated.  Will continue to monitor closely.

## 2014-06-02 NOTE — Assessment & Plan Note (Signed)
Patient complaining of some chronic, intermittent nausea; but no vomiting.  Patient states he does have anti-emetics at home to take on an as-needed basis.patient was given Zofran 8 mg IV while cancer [facility] today.

## 2014-06-02 NOTE — Assessment & Plan Note (Signed)
Abdomen continues low at 2.9 today.  Patient was encouraged to push protein in his diet is much as possible.

## 2014-06-02 NOTE — Telephone Encounter (Addendum)
Daughter called to cancel pt appointments today -describes pt as weak and dizzy. She does not think he will continue therapy. I called patient and he acknowledges that he is weak and dizzy. i offered appt today for symptom management and he declined. I called sharon and she will bring pt in for appt at 1315 for Cyndee bacon,.I instructed her to call 911 if she needs to to bring him to [facility] ED.

## 2014-06-02 NOTE — Assessment & Plan Note (Signed)
Pt c/o chronic lack of appetite; most likely secondary to recent chemotherapy.  Pt advised to push fluids and eat multiple small meals throughout the day.  Will also have nutrition meet with patient again.

## 2014-06-02 NOTE — Assessment & Plan Note (Signed)
Patient completed concurrent chemoradiation with weekly carboplatin/paclitaxel on 04/22/2014.  Patient was scheduled to initiate single agent Alimta chemotherapy today; but has made the decision to hold any further chemotherapy at this time.  He states that he prefers to recover from his previous chemotherapy and radiation prior to any additional treatment.  Patient was advised that he could call to reinitiate maintenance chemotherapy at any time.  Patient has plans to return to the [facility] on 06/24/2014 for labs and a followup visit.

## 2014-06-02 NOTE — Telephone Encounter (Signed)
added appt per pof....pt dtr aware

## 2014-06-02 NOTE — Patient Instructions (Signed)
You are being referred to a ENT specialist regarding your voice weakness/hoarseness Return in 1 week to start your maintenance chemotherapy Follow up in 4 weeks prior to the start of cycle #2

## 2014-06-02 NOTE — Progress Notes (Signed)
SYMPTOM MANAGEMENT CLINIC   HPI: Christopher Roach 81 y.o. male diagnosed with lung cancer.  The patient is status post concurrent chemotherapy and radiation therapy.  Patient completed weekly carboplatin/paclitaxel chemotherapy 04/22/2014.  Scheduled to initiate single agent maintenance Alimta chemotherapy today.  Patient called the cancer Center stating that he has made the decision to hold any maintenance Alimta chemotherapy time being; stating that he prefers to take some time to recover from his previous radiation therapy and chemotherapy.  He is complaining of some chronic nausea; but no vomiting.  He states he's had little oral intake and his requested IV fluid rehydration today.  She also states that he does suffer with some intermittent, chronic diarrhea alternating with constipation.  He denies any recent fevers or chills.  HPI  ROS  Past Medical History  Diagnosis Date  . Hypertension   . Hyperlipidemia   . Hypothyroidism   . Abnormal LFTs   . Gallbladder polyp   . BPH (benign prostatic hyperplasia)   . Actinic keratosis   . Lung nodule     benign BX 03/24/2008  . Anemia   . H/O asbestos exposure   . Pneumonia     hx of walking PNA  . Shortness of breath     with exertion  . Constipation     from medications  . Arthritis   . Kidney stone August 2014    hx of  . Birth mark     left arm and shoulder; Dark purple on upper L chest ;NOT A RASH    Past Surgical History  Procedure Laterality Date  . Back surgery      Dr Fannie Knee 1978 and 1982  . Cervical disc surgery    . Right foot surgery      plantar fascitis  . Right shoulder surgery      torn rotator cuff  . Right ankle fracture         . Right wrist fracture    . Foot surgery Left   . Tonsillectomy    . Appendectomy    . Colonoscopy    . Flexible bronchoscopy N/A 12/07/2012    Procedure: FLEXIBLE BRONCHOSCOPY;  Surgeon: [name], MD;  Location: [facility];  Service: Thoracic;  Laterality: N/A;  . Video  assisted thoracoscopy (vats)/thorocotomy Left 12/07/2012    Procedure: VIDEO ASSISTED THORACOSCOPY (VATS)/ EXPLORATORY THOROCOTOMY;  Surgeon: [name], MD;  Location: [facility] OR;  Service: Thoracic;  Laterality: Left;    has Nodule of left lung; Lung cancer, upper lobe; Non-small cell cancer of left lung; Fatigue; Neoplasm related pain; Constipation; Hyperphosphatemia; Transaminitis; Hypoalbuminemia; Thrombocytopenia; Hemoptysis; Malignant neoplasm of upper lobe of left lung; Dehydration; Anorexia; and Nausea without vomiting on his problem list.    is allergic to codeine.    Medication List       This list is accurate as of: 06/02/14  8:55 PM.  Always use your most recent med list.               ALPRAZolam 1 MG tablet  Commonly known as:  XANAX     amLODipine 10 MG tablet  Commonly known as:  NORVASC  Take 10 mg by mouth at bedtime.     aspirin EC 81 MG tablet  Take 81 mg by mouth daily.     benzonatate 200 MG capsule  Commonly known as:  TESSALON     chlorproMAZINE 25 MG tablet  Commonly known as:  THORAZINE  Take 25  mg by mouth 3 (three) times daily as needed.     eszopiclone 3 MG Tabs  Generic drug:  Eszopiclone  Take 3 mg by mouth at bedtime. Take immediately before bedtime     folic acid 1 MG tablet  Commonly known as:  FOLVITE  Take 1 mg by mouth daily.     levothyroxine 150 MCG tablet  Commonly known as:  SYNTHROID, LEVOTHROID  Take 150 mcg by mouth daily.     prochlorperazine 10 MG tablet  Commonly known as:  COMPAZINE  Take 1 tablet (10 mg total) by mouth every 6 (six) hours as needed for nausea or vomiting.     tamsulosin 0.4 MG Caps capsule  Commonly known as:  FLOMAX  Take 0.4 mg by mouth at bedtime.     traMADol 50 MG tablet  Commonly known as:  ULTRAM  Take 1 tablet (50 mg total) by mouth every 6 (six) hours as needed.     traZODone 50 MG tablet  Commonly known as:  DESYREL  1 tablet at bedtime as needed.         PHYSICAL  EXAMINATION  Blood pressure 120/72, pulse 109, temperature 98.4 F (36.9 C), temperature source Oral, resp. rate 28, weight 157 lb (71.215 kg).  Physical Exam  Constitutional: He is oriented to person, place, and time. Vital signs are normal. He appears dehydrated. He appears unhealthy.  HENT:  Head: Normocephalic and atraumatic.  Mouth/Throat: Oropharynx is clear and moist.  Eyes: Conjunctivae and EOM are normal. Pupils are equal, round, and reactive to light. Right eye exhibits no discharge. Left eye exhibits no discharge. No scleral icterus.  Neck: Normal range of motion. Neck supple. No JVD present. No tracheal deviation present. No thyromegaly present.  Cardiovascular: Normal rate, regular rhythm, normal heart sounds and intact distal pulses.   Pulmonary/Chest: Effort normal and breath sounds normal. No respiratory distress. He has no wheezes. He has no rales. He exhibits no tenderness.  Abdominal: Soft. Bowel sounds are normal. He exhibits no distension and no mass. There is no tenderness. There is no rebound and no guarding.  Musculoskeletal: Normal range of motion. He exhibits no edema or tenderness.  Lymphadenopathy:    He has no cervical adenopathy.  Neurological: He is alert and oriented to person, place, and time.  Skin: Skin is warm and dry. No rash noted. No erythema. There is pallor.  Psychiatric: Affect normal.  Nursing note and vitals reviewed.   LABORATORY DATA:. Appointment on 06/02/2014  Component Date Value Ref Range Status  . Sodium 06/02/2014 140  136 - 145 mEq/L Final  . Potassium 06/02/2014 4.5  3.5 - 5.1 mEq/L Final  . Chloride 06/02/2014 104  98 - 109 mEq/L Final  . CO2 06/02/2014 23  22 - 29 mEq/L Final  . Glucose 06/02/2014 122  70 - 140 mg/dl Final  . BUN 06/02/2014 21.9  7.0 - 26.0 mg/dL Final  . Creatinine 06/02/2014 1.3  0.7 - 1.3 mg/dL Final  . Total Bilirubin 06/02/2014 0.82  0.20 - 1.20 mg/dL Final  . Alkaline Phosphatase 06/02/2014 232* 40 - 150  U/L Final  . AST 06/02/2014 33  5 - 34 U/L Final  . ALT 06/02/2014 28  0 - 55 U/L Final  . Total Protein 06/02/2014 7.6  6.4 - 8.3 g/dL Final  . Albumin 06/02/2014 2.9* 3.5 - 5.0 g/dL Final  . Calcium 06/02/2014 9.7  8.4 - 10.4 mg/dL Final  . Anion Gap 06/02/2014 13* 3 - 11  mEq/L Final  . EGFR 06/02/2014 52* >90 ml/min/1.73 m2 Final   eGFR is calculated using the CKD-EPI Creatinine Equation (2009)  Orders Only on 06/02/2014  Component Date Value Ref Range Status  . WBC 06/02/2014 9.0  4.0 - 10.3 10e3/uL Final  . NEUT# 06/02/2014 7.1* 1.5 - 6.5 10e3/uL Final  . HGB 06/02/2014 11.0* 13.0 - 17.1 g/dL Final  . HCT 36/57/8429 33.9* 38.4 - 49.9 % Final  . Platelets 06/02/2014 153  140 - 400 10e3/uL Final  . MCV 06/02/2014 96.0  79.3 - 98.0 fL Final  . MCH 06/02/2014 31.2  27.2 - 33.4 pg Final  . MCHC 06/02/2014 32.4  32.0 - 36.0 g/dL Final  . RBC 27/72/6242 3.53* 4.20 - 5.82 10e6/uL Final  . RDW 06/02/2014 15.2* 11.0 - 14.6 % Final  . lymph# 06/02/2014 0.8* 0.9 - 3.3 10e3/uL Final  . MONO# 06/02/2014 0.9  0.1 - 0.9 10e3/uL Final  . Eosinophils Absolute 06/02/2014 0.1  0.0 - 0.5 10e3/uL Final  . Basophils Absolute 06/02/2014 0.1  0.0 - 0.1 10e3/uL Final  . NEUT% 06/02/2014 79.4* 39.0 - 75.0 % Final  . LYMPH% 06/02/2014 9.4* 14.0 - 49.0 % Final  . MONO% 06/02/2014 9.8  0.0 - 14.0 % Final  . EOS% 06/02/2014 0.8  0.0 - 7.0 % Final  . BASO% 06/02/2014 0.6  0.0 - 2.0 % Final     RADIOGRAPHIC STUDIES: No results found.  ASSESSMENT/PLAN:    Anorexia Pt c/o chronic lack of appetite; most likely secondary to recent chemotherapy.  Pt advised to push fluids and eat multiple small meals throughout the day.  Will also have nutrition meet with patient again.    Dehydration Pt with c/o chronic nausea; and little appetite. States that he has had minimal oral intake within past 48 hours. Pt appears dehydrated and weak. Patient to receive one maternal saline IV fluid rehydration today while at the  cancer Center.  He was also encouraged to push fluids is much as possible.  He may very well need to return to the cancer Center this week for additional IV fluid rehydration.   Hyperphosphatemia Alkaline phosphatase remains elevated.  Will continue to monitor closely.   Hypoalbuminemia Abdomen continues low at 2.9 today.  Patient was encouraged to push protein in his diet is much as possible.   Nausea without vomiting Patient complaining of some chronic, intermittent nausea; but no vomiting.  Patient states he does have anti-emetics at home to take on an as-needed basis.patient was given Zofran 8 mg IV while cancer Center today.   Non-small cell cancer of left lung Patient completed concurrent chemoradiation with weekly carboplatin/paclitaxel on 04/22/2014.  Patient was scheduled to initiate single agent Alimta chemotherapy today; but has made the decision to hold any further chemotherapy at this time.  He states that he prefers to recover from his previous chemotherapy and radiation prior to any additional treatment.  Patient was advised that he could call to reinitiate maintenance chemotherapy at any time.  Patient has plans to return to the cancer Center on 06/24/2014 for labs and a followup visit.   Patient stated understanding of all instructions; and was in agreement with this plan of care. The patient knows to call the clinic with any problems, questions or concerns.   Today was a shared visit with Dr. Arbutus Ped.  Total time spent with patient was 25 minutes;  with greater than 75 percent of that time spent in face to face counseling regarding patient's symptoms,  and coordination of care and follow up.  Disclaimer: This note was dictated with voice recognition software. Similar sounding words can inadvertently be transcribed and may not be corrected upon review.   Drue Second, NP 06/02/2014   Hematology/Oncology Attending: I had a face to face encounter with the patient.  I recommended his care plan. This is a very pleasant 81 years old white male with recurrent non-small cell lung cancer with involvement of the right upper lobe lung mass as well as mediastinal lymphadenopathy causing vocal cord paralysis and hoarseness of his voice. The patient completed 6 cycles of systemic chemotherapy with carboplatin and Alimta with initial response followed by stable disease. He was supposed to start maintenance chemotherapy with single agent Alimta, but because of the increasing fatigue and weakness the patient changed his mind and decided not to proceed with any further chemotherapy at this point. For the dehydration and nausea will arrange for the patient to receive 1 L of normal saline today with IV Zofran. We'll see him back for follow-up visit in 3 weeks for reevaluation and discussion of his treatment options again. We could consider him for treatment with immunotherapy with Nivolumab if he is not interesting in proceeding with chemotherapy. He was advised to call immediately if he has any concerning symptoms in the interval.  Disclaimer: This note was dictated with voice recognition software. Similar sounding words can inadvertently be transcribed and may be missed upon review. Eilleen Kempf., MD 06/03/2014

## 2014-06-02 NOTE — Patient Instructions (Signed)
Dehydration, Adult Dehydration is when you lose more fluids from the body than you take in. Vital organs like the kidneys, brain, and heart cannot function without a proper amount of fluids and salt. Any loss of fluids from the body can cause dehydration.  CAUSES   Vomiting.  Diarrhea.  Excessive sweating.  Excessive urine output.  Fever. SYMPTOMS  Mild dehydration  Thirst.  Dry lips.  Slightly dry mouth. Moderate dehydration  Very dry mouth.  Sunken eyes.  Skin does not bounce back quickly when lightly pinched and released.  Dark urine and decreased urine production.  Decreased tear production.  Headache. Severe dehydration  Very dry mouth.  Extreme thirst.  Rapid, weak pulse (more than 100 beats per minute at rest).  Cold hands and feet.  Not able to sweat in spite of heat and temperature.  Rapid breathing.  Blue lips.  Confusion and lethargy.  Difficulty being awakened.  Minimal urine production.  No tears. DIAGNOSIS  Your caregiver will diagnose dehydration based on your symptoms and your exam. Blood and urine tests will help confirm the diagnosis. The diagnostic evaluation should also identify the cause of dehydration. TREATMENT  Treatment of mild or moderate dehydration can often be done at home by increasing the amount of fluids that you drink. It is best to drink small amounts of fluid more often. Drinking too much at one time can make vomiting worse. Refer to the home care instructions below. Severe dehydration needs to be treated at the hospital where you will probably be given intravenous (IV) fluids that contain water and electrolytes. HOME CARE INSTRUCTIONS   Ask your caregiver about specific rehydration instructions.  Drink enough fluids to keep your urine clear or pale yellow.  Drink small amounts frequently if you have nausea and vomiting.  Eat as you normally do.  Avoid:  Foods or drinks high in sugar.  Carbonated  drinks.  Juice.  Extremely hot or cold fluids.  Drinks with caffeine.  Fatty, greasy foods.  Alcohol.  Tobacco.  Overeating.  Gelatin desserts.  Wash your hands well to avoid spreading bacteria and viruses.  Only take over-the-counter or prescription medicines for pain, discomfort, or fever as directed by your caregiver.  Ask your caregiver if you should continue all prescribed and over-the-counter medicines.  Keep all follow-up appointments with your caregiver. SEEK MEDICAL CARE IF:  You have abdominal pain and it increases or stays in one area (localizes).  You have a rash, stiff neck, or severe headache.  You are irritable, sleepy, or difficult to awaken.  You are weak, dizzy, or extremely thirsty. SEEK IMMEDIATE MEDICAL CARE IF:   You are unable to keep fluids down or you get worse despite treatment.  You have frequent episodes of vomiting or diarrhea.  You have blood or green matter (bile) in your vomit.  You have blood in your stool or your stool looks black and tarry.  You have not urinated in 6 to 8 hours, or you have only urinated a small amount of very dark urine.  You have a fever.  You faint. MAKE SURE YOU:   Understand these instructions.  Will watch your condition.  Will get help right away if you are not doing well or get worse. Document Released: 03/28/2005 Document Revised: 06/20/2011 Document Reviewed: 11/15/2010 ExitCare Patient Information 2015 [employer]. This information is not intended to replace advice given to you by your health care provider. Make sure you discuss any questions you have with your health care   provider.  

## 2014-06-03 ENCOUNTER — Telehealth: Payer: Self-pay | Admitting: Internal Medicine

## 2014-06-03 ENCOUNTER — Telehealth: Payer: Self-pay

## 2014-06-03 NOTE — Telephone Encounter (Signed)
Confirm appointment for 03/17. mailed calendar.

## 2014-06-03 NOTE — Telephone Encounter (Signed)
Pt feels better than yesterday. Daughter made him dinner so he was able to eat. She is moving in today.

## 2014-06-17 ENCOUNTER — Encounter: Payer: Medicare Other | Admitting: Nutrition

## 2014-06-17 ENCOUNTER — Ambulatory Visit: Payer: Medicare Other

## 2014-06-17 ENCOUNTER — Other Ambulatory Visit: Payer: Medicare Other

## 2014-06-23 ENCOUNTER — Other Ambulatory Visit: Payer: Self-pay | Admitting: *Deleted

## 2014-06-23 MED ORDER — FOLIC ACID 1 MG PO TABS: 1.0000 mg | ORAL_TABLET | Freq: Every day | ORAL | Status: AC

## 2014-06-24 ENCOUNTER — Ambulatory Visit: Payer: Medicare Other | Admitting: Internal Medicine

## 2014-06-24 ENCOUNTER — Encounter: Payer: Medicare Other | Admitting: Nutrition

## 2014-06-24 ENCOUNTER — Other Ambulatory Visit: Payer: Medicare Other

## 2014-06-26 ENCOUNTER — Other Ambulatory Visit (HOSPITAL_BASED_OUTPATIENT_CLINIC_OR_DEPARTMENT_OTHER): Payer: Medicare Other

## 2014-06-26 ENCOUNTER — Telehealth: Payer: Self-pay | Admitting: Internal Medicine

## 2014-06-26 ENCOUNTER — Other Ambulatory Visit: Payer: Self-pay | Admitting: Medical Oncology

## 2014-06-26 ENCOUNTER — Ambulatory Visit (HOSPITAL_BASED_OUTPATIENT_CLINIC_OR_DEPARTMENT_OTHER): Payer: Medicare Other | Admitting: Internal Medicine

## 2014-06-26 ENCOUNTER — Encounter: Payer: Self-pay | Admitting: Internal Medicine

## 2014-06-26 ENCOUNTER — Encounter: Payer: Medicare Other | Admitting: Nutrition

## 2014-06-26 VITALS — BP 123/71 | HR 70 | Temp 98.1°F | Resp 18 | Ht 68.0 in | Wt 155.3 lb

## 2014-06-26 DIAGNOSIS — R490 Dysphonia: Secondary | ICD-10-CM

## 2014-06-26 DIAGNOSIS — C3412 Malignant neoplasm of upper lobe, left bronchus or lung: Secondary | ICD-10-CM | POA: Diagnosis not present

## 2014-06-26 DIAGNOSIS — C3410 Malignant neoplasm of upper lobe, unspecified bronchus or lung: Secondary | ICD-10-CM

## 2014-06-26 DIAGNOSIS — C3492 Malignant neoplasm of unspecified part of left bronchus or lung: Secondary | ICD-10-CM

## 2014-06-26 DIAGNOSIS — R5382 Chronic fatigue, unspecified: Secondary | ICD-10-CM

## 2014-06-26 DIAGNOSIS — R530 Neoplastic (malignant) related fatigue: Secondary | ICD-10-CM

## 2014-06-26 DIAGNOSIS — R634 Abnormal weight loss: Secondary | ICD-10-CM

## 2014-06-26 LAB — CBC WITH DIFFERENTIAL/PLATELET
BASO%: 0.8 % (ref 0.0–2.0)
BASOS ABS: 0.1 10*3/uL (ref 0.0–0.1)
EOS ABS: 0 10*3/uL (ref 0.0–0.5)
EOS%: 0.7 % (ref 0.0–7.0)
HCT: 32.6 % — ABNORMAL LOW (ref 38.4–49.9)
HGB: 10.5 g/dL — ABNORMAL LOW (ref 13.0–17.1)
LYMPH%: 8.8 % — AB (ref 14.0–49.0)
MCH: 29.9 pg (ref 27.2–33.4)
MCHC: 32 g/dL (ref 32.0–36.0)
MCV: 93.4 fL (ref 79.3–98.0)
MONO#: 0.6 10*3/uL (ref 0.1–0.9)
MONO%: 9 % (ref 0.0–14.0)
NEUT#: 5.5 10*3/uL (ref 1.5–6.5)
NEUT%: 80.7 % — ABNORMAL HIGH (ref 39.0–75.0)
PLATELETS: 185 10*3/uL (ref 140–400)
RBC: 3.49 10*6/uL — ABNORMAL LOW (ref 4.20–5.82)
RDW: 15.2 % — AB (ref 11.0–14.6)
WBC: 6.8 10*3/uL (ref 4.0–10.3)
lymph#: 0.6 10*3/uL — ABNORMAL LOW (ref 0.9–3.3)

## 2014-06-26 LAB — COMPREHENSIVE METABOLIC PANEL (CC13)
ALT: 24 U/L (ref 0–55)
AST: 32 U/L (ref 5–34)
Albumin: 2.7 g/dL — ABNORMAL LOW (ref 3.5–5.0)
Alkaline Phosphatase: 222 U/L — ABNORMAL HIGH (ref 40–150)
Anion Gap: 11 mEq/L (ref 3–11)
BUN: 11.8 mg/dL (ref 7.0–26.0)
CHLORIDE: 102 meq/L (ref 98–109)
CO2: 26 mEq/L (ref 22–29)
Calcium: 8.9 mg/dL (ref 8.4–10.4)
Creatinine: 1.1 mg/dL (ref 0.7–1.3)
EGFR: 66 mL/min/{1.73_m2} — ABNORMAL LOW (ref >90)
GLUCOSE: 166 mg/dL — AB (ref 70–140)
Potassium: 4 mEq/L (ref 3.5–5.1)
Sodium: 139 mEq/L (ref 136–145)
Total Bilirubin: 1.12 mg/dL (ref 0.20–1.20)
Total Protein: 7.3 g/dL (ref 6.4–8.3)

## 2014-06-26 MED ORDER — DRONABINOL 2.5 MG PO CAPS: 2.5000 mg | ORAL_CAPSULE | Freq: Two times a day (BID) | ORAL | Status: DC

## 2014-06-26 MED ORDER — BENZONATATE 200 MG PO CAPS: 200.0000 mg | ORAL_CAPSULE | Freq: Two times a day (BID) | ORAL | Status: DC | PRN

## 2014-06-26 MED ORDER — PROCHLORPERAZINE MALEATE 10 MG PO TABS: 10.0000 mg | ORAL_TABLET | Freq: Four times a day (QID) | ORAL | Status: AC | PRN

## 2014-06-26 MED ORDER — TRAMADOL HCL 50 MG PO TABS: 50.0000 mg | ORAL_TABLET | Freq: Four times a day (QID) | ORAL | Status: DC | PRN

## 2014-06-26 NOTE — Progress Notes (Signed)
Christopher Roach  [phone] [phone]) [phone]  OFFICE VISIT PROGRESS NOTE  [name], [place] [address]  DIAGNOSIS: Recurrent non-small cell lung cancer initially diagnosed as Stage IIIA (T1b, N2, M0) non-small cell lung cancer, adenocarcinoma with negative EGFR mutation and negative ALK gene translocation  PRIOR THERAPY:  1) Concurrent chemoradiation with weekly carboplatin for an AUC of 2 and paclitaxel at 45 mg per meter squared concurrent with radiation therapy under the care of Dr. Lisbeth Renshaw. He status post 7 cycles of chemotherapy. Last dose was given 02/11/2013 with partial response. 2) Systemic chemotherapy with carboplatin for AUC of 5 and Alimta 500 mg/M2 every 3 weeks. First dose on 01/07/2014. Status post 6 cycles. Last dose was given 04/22/2014 with stable disease.  CURRENT THERAPY: Immunotherapy with Nivolumab 3 MG/M2 every 2 weeks. First dose expected on 07/01/2014  DISEASE STAGE: Metastatic non-small cell lung cancer   CHEMOTHERAPY INTENT: Palliative.  CURRENT # OF CHEMOTHERAPY CYCLES: 1  CURRENT ANTIEMETICS: Zofran, dexamethasone, and Compazine for at home use  CURRENT SMOKING STATUS: Former smoker quit 12/20/1992  ORAL CHEMOTHERAPY AND CONSENT: n/a  CURRENT BISPHOSPHONATES USE: None  PAIN MANAGEMENT: Dilaudid  NARCOTICS INDUCED CONSTIPATION: None  LIVING WILL AND CODE STATUS: ?  INTERVAL HISTORY: Christopher Roach 81 y.o. male returns for followup visit accompanied by her family members. The patient continues to complain of fatigue and weakness as well as hoarseness of his voice. He was very reluctant to consider any further therapy and he has been off treatment for several weeks. He changed his mind and would like to discuss the option of immunotherapy and he is here today for evaluation and discussion of this treatment. He also continues to have mild pain on the left chest wall and shoulder area. The  patient denied having any nausea or vomiting. He denied having any fever or chills. He also has lack of appetite and weight loss recently.  MEDICAL HISTORY: Past Medical History  Diagnosis Date  . Hypertension   . Hyperlipidemia   . Hypothyroidism   . Abnormal LFTs   . Gallbladder polyp   . BPH (benign prostatic hyperplasia)   . Actinic keratosis   . Lung nodule     benign BX 03/24/2008  . Anemia   . H/O asbestos exposure   . Pneumonia     hx of walking PNA  . Shortness of breath     with exertion  . Constipation     from medications  . Arthritis   . Kidney stone August 2014    hx of  . Birth mark     left arm and shoulder; Dark purple on upper L chest ;NOT A RASH    ALLERGIES:  is allergic to codeine.  MEDICATIONS:  Current Outpatient Prescriptions  Medication Sig Dispense Refill  . ALPRAZolam (XANAX) 1 MG tablet   0  . aspirin EC 81 MG tablet Take 81 mg by mouth daily.    . benzonatate (TESSALON) 200 MG capsule     . Eszopiclone (ESZOPICLONE) 3 MG TABS Take 3 mg by mouth at bedtime. Take immediately before bedtime    . folic acid (FOLVITE) 1 MG tablet Take 1 tablet (1 mg total) by mouth daily. 30 tablet 3  . levothyroxine (SYNTHROID, LEVOTHROID) 150 MCG tablet Take 150 mcg by mouth daily.  0  . tamsulosin (FLOMAX) 0.4 MG CAPS Take 0.4 mg by mouth at bedtime.     . traMADol (ULTRAM) 50 MG  tablet Take 1 tablet (50 mg total) by mouth every 6 (six) hours as needed. 30 tablet 0  . traZODone (DESYREL) 50 MG tablet 1 tablet at bedtime as needed.  0  . amLODipine (NORVASC) 10 MG tablet Take 10 mg by mouth at bedtime.     . chlorproMAZINE (THORAZINE) 25 MG tablet Take 25 mg by mouth 3 (three) times daily as needed.    . prochlorperazine (COMPAZINE) 10 MG tablet Take 1 tablet (10 mg total) by mouth every 6 (six) hours as needed for nausea or vomiting. (Patient not taking: Reported on 06/26/2014) 30 tablet 0   No current facility-administered medications for this visit.     SURGICAL HISTORY:  Past Surgical History  Procedure Laterality Date  . Back surgery      Dr Collie Siad 1978 and 1982  . Cervical disc surgery    . Right foot surgery      plantar fascitis  . Right shoulder surgery      torn rotator cuff  . Right ankle fracture         . Right wrist fracture    . Foot surgery Left   . Tonsillectomy    . Appendectomy    . Colonoscopy    . Flexible bronchoscopy N/A 12/07/2012    Procedure: FLEXIBLE BRONCHOSCOPY;  Surgeon: Gaye Pollack, MD;  Location: [facility] OR;  Service: Thoracic;  Laterality: N/A;  . Video assisted thoracoscopy (vats)/thorocotomy Left 12/07/2012    Procedure: VIDEO ASSISTED THORACOSCOPY (VATS)/ EXPLORATORY THOROCOTOMY;  Surgeon: Gaye Pollack, MD;  Location: [facility] OR;  Service: Thoracic;  Laterality: Left;    REVIEW OF SYSTEMS:  Constitutional: positive for anorexia, fatigue and weight loss Eyes: negative Ears, nose, mouth, throat, and face: positive for hoarseness Respiratory: positive for cough, dyspnea on exertion and pleurisy/chest pain Cardiovascular: negative Gastrointestinal: negative Genitourinary:negative Integument/breast: negative Hematologic/lymphatic: negative Musculoskeletal:negative Neurological: negative Behavioral/Psych: negative Endocrine: negative Allergic/Immunologic: negative   PHYSICAL EXAMINATION: General appearance: alert, cooperative, appears stated age and no distress Head: Normocephalic, without obvious abnormality, atraumatic Neck: no adenopathy, no JVD, supple, symmetrical, trachea midline and thyroid not enlarged, symmetric, no tenderness/mass/nodules Lymph nodes: Cervical, supraclavicular, and axillary nodes normal. Resp: diminished breath sounds LUL and dullness to percussion LUL Cardio: regular rate and rhythm, S1, S2 normal, no murmur, click, rub or gallop GI: soft, non-tender; bowel sounds normal; no masses,  no organomegaly Extremities: extremities normal, atraumatic, no cyanosis or  edema Neurologic: Alert and oriented X 3, normal strength and tone. Normal symmetric reflexes. Normal coordination and gait  ECOG PERFORMANCE STATUS: 1 - Symptomatic but completely ambulatory  Blood pressure 123/71, pulse 70, temperature 98.1 F (36.7 C), temperature source Oral, resp. rate 18, height $RemoveBe'5\' 8"'CaPhCBzOD$  (1.727 m), weight 155 lb 4.8 oz (70.444 kg), SpO2 95 %.  LABORATORY DATA: Lab Results  Component Value Date   WBC 6.8 06/26/2014   HGB 10.5* 06/26/2014   HCT 32.6* 06/26/2014   MCV 93.4 06/26/2014   PLT 185 06/26/2014      Chemistry      Component Value Date/Time   NA 139 06/26/2014 1018   NA 135* 01/10/2014 1446   K 4.0 06/26/2014 1018   K 4.3 01/10/2014 1446   CL 98 01/10/2014 1446   CO2 26 06/26/2014 1018   CO2 24 01/10/2014 1446   BUN 11.8 06/26/2014 1018   BUN 20 01/10/2014 1446   CREATININE 1.1 06/26/2014 1018   CREATININE 0.90 01/10/2014 1446      Component Value Date/Time  CALCIUM 8.9 06/26/2014 1018   CALCIUM 8.7 01/10/2014 1446   ALKPHOS 222* 06/26/2014 1018   ALKPHOS 129* 12/05/2012 1348   AST 32 06/26/2014 1018   AST 44* 12/05/2012 1348   ALT 24 06/26/2014 1018   ALT 35 12/05/2012 1348   BILITOT 1.12 06/26/2014 1018   BILITOT 0.5 12/05/2012 1348       RADIOGRAPHIC STUDIES: No results found. ASSESSMENT/PLAN: This is a very pleasant 81 years old white male recently diagnosed with a stage IIIa non-small cell lung cancer currently undergoing concurrent chemoradiation with weekly carboplatin and paclitaxel status post 7 cycles.  He is currently on treatment with systemic chemotherapy with carboplatin and Alimta status post 6 cycles for recurrent disease. The recent CT scan of the chest, abdomen and pelvis showed no evidence for disease progression.  The patient is here today for evaluation and discussion of his treatment options. He is not interested in proceeding with any further chemotherapy but he would consider immunotherapy. I discussed with the  patient the option of immunotherapy with Nivolumab 3 MG/KG every 2 weeks. I discussed with the patient and his family the adverse effect of this treatment including immune mediated inflammatory process like skin rash, diarrhea, liver, renal, or endocrine dysfunction. He would like to proceed with the treatment as soon as possible. I expect him to start the first cycle of this treatment next week. For the weight loss he was referred to the dietitian at the [facility]. I will also start the patient on Marinol 2.5 mg by mouth twice a day. He would come back for follow-up visit in 3 weeks for reevaluation and management of any adverse effect of his treatment. The patient was advised to call immediately if he has any concerning symptoms in the interval. All questions were answered. The patient knows to call the clinic with any problems, questions or concerns. We can certainly see the patient much sooner if necessary.  Disclaimer: This note was dictated with voice recognition software. Similar sounding words can inadvertently be transcribed and may not be corrected upon review.   Eilleen Kempf., MD

## 2014-06-26 NOTE — Telephone Encounter (Signed)
Gave and printed appt sched and avs for pt for March adn April...sed added tx.

## 2014-06-30 ENCOUNTER — Other Ambulatory Visit: Payer: Self-pay | Admitting: Medical Oncology

## 2014-06-30 DIAGNOSIS — C3410 Malignant neoplasm of upper lobe, unspecified bronchus or lung: Secondary | ICD-10-CM

## 2014-07-01 ENCOUNTER — Ambulatory Visit (HOSPITAL_BASED_OUTPATIENT_CLINIC_OR_DEPARTMENT_OTHER): Payer: Medicare Other

## 2014-07-01 ENCOUNTER — Other Ambulatory Visit (HOSPITAL_BASED_OUTPATIENT_CLINIC_OR_DEPARTMENT_OTHER): Payer: Medicare Other

## 2014-07-01 VITALS — BP 116/66 | HR 140 | Temp 98.0°F

## 2014-07-01 DIAGNOSIS — C3492 Malignant neoplasm of unspecified part of left bronchus or lung: Secondary | ICD-10-CM

## 2014-07-01 DIAGNOSIS — C3412 Malignant neoplasm of upper lobe, left bronchus or lung: Secondary | ICD-10-CM | POA: Diagnosis not present

## 2014-07-01 DIAGNOSIS — C3410 Malignant neoplasm of upper lobe, unspecified bronchus or lung: Secondary | ICD-10-CM

## 2014-07-01 DIAGNOSIS — Z79899 Other long term (current) drug therapy: Secondary | ICD-10-CM | POA: Diagnosis not present

## 2014-07-01 DIAGNOSIS — Z5112 Encounter for antineoplastic immunotherapy: Secondary | ICD-10-CM

## 2014-07-01 DIAGNOSIS — R5382 Chronic fatigue, unspecified: Secondary | ICD-10-CM

## 2014-07-01 LAB — COMPREHENSIVE METABOLIC PANEL (CC13)
ALBUMIN: 2.7 g/dL — AB (ref 3.5–5.0)
ALT: 22 U/L (ref 0–55)
AST: 29 U/L (ref 5–34)
Alkaline Phosphatase: 214 U/L — ABNORMAL HIGH (ref 40–150)
Anion Gap: 14 mEq/L — ABNORMAL HIGH (ref 3–11)
BUN: 13.1 mg/dL (ref 7.0–26.0)
CHLORIDE: 102 meq/L (ref 98–109)
CO2: 22 mEq/L (ref 22–29)
Calcium: 9.4 mg/dL (ref 8.4–10.4)
Creatinine: 1 mg/dL (ref 0.7–1.3)
EGFR: 68 mL/min/{1.73_m2} — ABNORMAL LOW (ref >90)
GLUCOSE: 165 mg/dL — AB (ref 70–140)
POTASSIUM: 3.7 meq/L (ref 3.5–5.1)
Sodium: 138 mEq/L (ref 136–145)
Total Bilirubin: 0.89 mg/dL (ref 0.20–1.20)
Total Protein: 7.5 g/dL (ref 6.4–8.3)

## 2014-07-01 LAB — CBC WITH DIFFERENTIAL/PLATELET
BASO%: 0.9 % (ref 0.0–2.0)
Basophils Absolute: 0.1 10*3/uL (ref 0.0–0.1)
EOS%: 1.1 % (ref 0.0–7.0)
Eosinophils Absolute: 0.1 10*3/uL (ref 0.0–0.5)
HEMATOCRIT: 33.5 % — AB (ref 38.4–49.9)
HEMOGLOBIN: 10.6 g/dL — AB (ref 13.0–17.1)
LYMPH#: 0.7 10*3/uL — AB (ref 0.9–3.3)
LYMPH%: 8.4 % — ABNORMAL LOW (ref 14.0–49.0)
MCH: 29.1 pg (ref 27.2–33.4)
MCHC: 31.6 g/dL — ABNORMAL LOW (ref 32.0–36.0)
MCV: 92.1 fL (ref 79.3–98.0)
MONO#: 0.8 10*3/uL (ref 0.1–0.9)
MONO%: 9.4 % (ref 0.0–14.0)
NEUT%: 80.2 % — AB (ref 39.0–75.0)
NEUTROS ABS: 6.6 10*3/uL — AB (ref 1.5–6.5)
Platelets: 201 10*3/uL (ref 140–400)
RBC: 3.64 10*6/uL — ABNORMAL LOW (ref 4.20–5.82)
RDW: 15.2 % — AB (ref 11.0–14.6)
WBC: 8.2 10*3/uL (ref 4.0–10.3)

## 2014-07-01 LAB — TSH CHCC: TSH: 0.29 m(IU)/L — ABNORMAL LOW (ref 0.320–4.118)

## 2014-07-01 MED ORDER — SODIUM CHLORIDE 0.9 % IV SOLN
1000.0000 mL | Freq: Once | INTRAVENOUS | Status: AC
  Administered 2014-07-01: 1000 mL via INTRAVENOUS

## 2014-07-01 MED ORDER — SODIUM CHLORIDE 0.9 % IV SOLN
3.0000 mg/kg | Freq: Once | INTRAVENOUS | Status: AC
  Administered 2014-07-01: 210 mg via INTRAVENOUS
  Filled 2014-07-01: qty 21

## 2014-07-01 MED ORDER — SODIUM CHLORIDE 0.9 % IV SOLN
Freq: Once | INTRAVENOUS | Status: AC
  Administered 2014-07-01: 14:00:00 via INTRAVENOUS

## 2014-07-01 NOTE — Patient Instructions (Signed)
[facility] Discharge Instructions for Patients Receiving Chemotherapy  Today you received the following chemotherapy agents :  nivolumab  To help prevent nausea and vomiting after your treatment, we encourage you to take your nausea medicationNivolumab injection What is this medicine? NIVOLUMAB (nye VOL ue mab) is used to treat certain types of melanoma and lung cancer. This medicine may be used for other purposes; ask your health care provider or pharmacist if you have questions. COMMON BRAND NAME(S): Opdivo What should I tell my health care provider before I take this medicine? They need to know if you have any of these conditions: -eye disease, vision problems -history of pancreatitis -immune system problems -inflammatory bowel disease -kidney disease -liver disease -lung disease -lupus -myasthenia gravis -multiple sclerosis -organ transplant -stomach or intestine problems -thyroid disease -tingling of the fingers or toes, or other nerve disorder -an unusual or allergic reaction to nivolumab, other medicines, foods, dyes, or preservatives -pregnant or trying to get pregnant -breast-feeding How should I use this medicine? This medicine is for infusion into a vein. It is given by a health care professional in a hospital or clinic setting. A special MedGuide will be given to you before each treatment. Be sure to read this information carefully each time. Talk to your pediatrician regarding the use of this medicine in children. Special care may be needed. Overdosage: If you think you've taken too much of this medicine contact a poison control center or emergency room at once. Overdosage: If you think you have taken too much of this medicine contact a poison control center or emergency room at once. NOTE: This medicine is only for you. Do not share this medicine with others. What if I miss a dose? It is important not to miss your dose. Call your doctor or health care  professional if you are unable to keep an appointment. What may interact with this medicine? Interactions have not been studied. This list may not describe all possible interactions. Give your health care provider a list of all the medicines, herbs, non-prescription drugs, or dietary supplements you use. Also tell them if you smoke, drink alcohol, or use illegal drugs. Some items may interact with your medicine. What should I watch for while using this medicine? Tell your doctor or healthcare professional if your symptoms do not start to get better or if they get worse. Your condition will be monitored carefully while you are receiving this medicine. You may need blood work done while you are taking this medicine. What side effects may I notice from receiving this medicine? Side effects that you should report to your doctor or health care professional as soon as possible: -allergic reactions like skin rash, itching or hives, swelling of the face, lips, or tongue -black, tarry stools -bloody or watery diarrhea -changes in vision -chills -cough -depressed mood -eye pain -feeling anxious -fever -general ill feeling or flu-like symptoms -hair loss -loss of appetite -low blood counts - this medicine may decrease the number of white blood cells, red blood cells and platelets. You may be at increased risk for infections and bleeding -pain, tingling, numbness in the hands or feet -redness, blistering, peeling or loosening of the skin, including inside the mouth -red pinpoint spots on skin -signs of decreased platelets or bleeding - bruising, pinpoint red spots on the skin, black, tarry stools, blood in the urine -signs of decreased red blood cells - unusually weak or tired, feeling faint or lightheaded, falls -signs of infection - fever or  chills, cough, sore throat, pain or trouble passing urine -signs and symptoms of a dangerous change in heartbeat or heart rhythm like chest pain; dizziness;  fast or irregular heartbeat; palpitations; feeling faint or lightheaded, falls; breathing problems -signs and symptoms of high blood sugar such as dizziness; dry mouth; dry skin; fruity breath; nausea; stomach pain; increased hunger or thirst; increased urination -signs and symptoms of kidney injury like trouble passing urine or change in the amount of urine -signs and symptoms of liver injury like dark yellow or brown urine; general ill feeling or flu-like symptoms; light-colored stools; loss of appetite; nausea; right upper belly pain; unusually weak or tired; yellowing of the eyes or skin -signs and symptoms of increased potassium like muscle weakness; chest pain; or fast, irregular heartbeat -signs and symptoms of low potassium like muscle cramps or muscle pain; chest pain; dizziness; feeling faint or lightheaded, falls; palpitations; breathing problems; or fast, irregular heartbeat -swelling of the ankles, feet, hands -weight gainSide effects that usually do not require medical attention (report to your doctor or health care professional if they continue or are bothersome): -constipation -general ill feeling or flu-like symptoms -hair loss -loss of appetite -nausea, vomiting This list may not describe all possible side effects. Call your doctor for medical advice about side effects. You may report side effects to FDA at 1-800-FDA-1088. Where should I keep my medicine? This drug is given in a hospital or clinic and will not be stored at home. NOTE: This sheet is a summary. It may not cover all possible information. If you have questions about this medicine, talk to your doctor, pharmacist, or health care provider.  2015, Elsevier/Gold Standard. (2013-06-17 13:18:19)    If you develop nausea and vomiting that is not controlled by your nausea medication, call the clinic.   BELOW ARE SYMPTOMS THAT SHOULD BE REPORTED IMMEDIATELY:  *FEVER GREATER THAN 100.5 F  *CHILLS WITH OR WITHOUT  FEVER  NAUSEA AND VOMITING THAT IS NOT CONTROLLED WITH YOUR NAUSEA MEDICATION  *UNUSUAL SHORTNESS OF BREATH  *UNUSUAL BRUISING OR BLEEDING  TENDERNESS IN MOUTH AND THROAT WITH OR WITHOUT PRESENCE OF ULCERS  *URINARY PROBLEMS  *BOWEL PROBLEMS  UNUSUAL RASH Items with * indicate a potential emergency and should be followed up as soon as possible.  Feel free to call the clinic you have any questions or concerns. The clinic phone number is [phone].  Please show the [facility] at check-in to the Emergency Department and triage nurse.

## 2014-07-01 NOTE — Progress Notes (Signed)
Patient brought back in w/c, son-in-law states that patient about "fell out" in waiting room. Vs taken, HR 140, other vs stable. Dr. Julien Nordmann notified, orders received to give 1 liter of fluid along with his Nivolumab.

## 2014-07-02 ENCOUNTER — Other Ambulatory Visit: Payer: Self-pay | Admitting: Medical Oncology

## 2014-07-02 ENCOUNTER — Telehealth: Payer: Self-pay

## 2014-07-02 ENCOUNTER — Telehealth: Payer: Self-pay | Admitting: Internal Medicine

## 2014-07-02 DIAGNOSIS — R112 Nausea with vomiting, unspecified: Secondary | ICD-10-CM

## 2014-07-02 NOTE — Telephone Encounter (Signed)
Spoke with patients daughter and she is aware of the ivf app on 3/24

## 2014-07-02 NOTE — Telephone Encounter (Signed)
-----   Message from Brien Few, RN sent at 07/01/2014  6:17 PM EDT ----- Regarding: chemo follow up call Nivolumab.MKM

## 2014-07-02 NOTE — Telephone Encounter (Signed)
Pt has been vomiting when he attempts to eat since chemo yesterday. 1/2 a biscuit this am. He is also not drinking much. They are giving him compazine 1 hour before attempt to eat. He is voiding. She has not noted if it is dark. His daughter is in boots on both feet and unable to drive. She could arrange a ride for tomorrow if he needs to come to [facility]. Her husband is working. Verified [facility] on [address] if new Rx sent.

## 2014-07-02 NOTE — Telephone Encounter (Addendum)
He drank 1/2 millk shake and has 9 oz water since 1130. He is not vomiting. Note to [place].

## 2014-07-03 ENCOUNTER — Ambulatory Visit (HOSPITAL_BASED_OUTPATIENT_CLINIC_OR_DEPARTMENT_OTHER): Payer: Medicare Other

## 2014-07-03 VITALS — BP 136/59 | HR 90 | Temp 98.7°F

## 2014-07-03 DIAGNOSIS — C3412 Malignant neoplasm of upper lobe, left bronchus or lung: Secondary | ICD-10-CM

## 2014-07-03 DIAGNOSIS — R634 Abnormal weight loss: Secondary | ICD-10-CM

## 2014-07-03 DIAGNOSIS — R630 Anorexia: Secondary | ICD-10-CM | POA: Diagnosis not present

## 2014-07-03 DIAGNOSIS — R531 Weakness: Secondary | ICD-10-CM | POA: Diagnosis not present

## 2014-07-03 DIAGNOSIS — R5383 Other fatigue: Secondary | ICD-10-CM

## 2014-07-03 DIAGNOSIS — R112 Nausea with vomiting, unspecified: Secondary | ICD-10-CM

## 2014-07-03 MED ORDER — SODIUM CHLORIDE 0.9 % IV SOLN
INTRAVENOUS | Status: DC
Start: 2014-07-03 — End: 2014-07-03
  Administered 2014-07-03: 14:00:00 via INTRAVENOUS

## 2014-07-03 NOTE — Patient Instructions (Signed)
Dehydration, Adult Dehydration is when you lose more fluids from the body than you take in. Vital organs like the kidneys, brain, and heart cannot function without a proper amount of fluids and salt. Any loss of fluids from the body can cause dehydration.  CAUSES   Vomiting.  Diarrhea.  Excessive sweating.  Excessive urine output.  Fever. SYMPTOMS  Mild dehydration  Thirst.  Dry lips.  Slightly dry mouth. Moderate dehydration  Very dry mouth.  Sunken eyes.  Skin does not bounce back quickly when lightly pinched and released.  Dark urine and decreased urine production.  Decreased tear production.  Headache. Severe dehydration  Very dry mouth.  Extreme thirst.  Rapid, weak pulse (more than 100 beats per minute at rest).  Cold hands and feet.  Not able to sweat in spite of heat and temperature.  Rapid breathing.  Blue lips.  Confusion and lethargy.  Difficulty being awakened.  Minimal urine production.  No tears. DIAGNOSIS  Your caregiver will diagnose dehydration based on your symptoms and your exam. Blood and urine tests will help confirm the diagnosis. The diagnostic evaluation should also identify the cause of dehydration. TREATMENT  Treatment of mild or moderate dehydration can often be done at home by increasing the amount of fluids that you drink. It is best to drink small amounts of fluid more often. Drinking too much at one time can make vomiting worse. Refer to the home care instructions below. Severe dehydration needs to be treated at the hospital where you will probably be given intravenous (IV) fluids that contain water and electrolytes. HOME CARE INSTRUCTIONS   Ask your caregiver about specific rehydration instructions.  Drink enough fluids to keep your urine clear or pale yellow.  Drink small amounts frequently if you have nausea and vomiting.  Eat as you normally do.  Avoid:  Foods or drinks high in sugar.  Carbonated  drinks.  Juice.  Extremely hot or cold fluids.  Drinks with caffeine.  Fatty, greasy foods.  Alcohol.  Tobacco.  Overeating.  Gelatin desserts.  Wash your hands well to avoid spreading bacteria and viruses.  Only take over-the-counter or prescription medicines for pain, discomfort, or fever as directed by your caregiver.  Ask your caregiver if you should continue all prescribed and over-the-counter medicines.  Keep all follow-up appointments with your caregiver. SEEK MEDICAL CARE IF:  You have abdominal pain and it increases or stays in one area (localizes).  You have a rash, stiff neck, or severe headache.  You are irritable, sleepy, or difficult to awaken.  You are weak, dizzy, or extremely thirsty. SEEK IMMEDIATE MEDICAL CARE IF:   You are unable to keep fluids down or you get worse despite treatment.  You have frequent episodes of vomiting or diarrhea.  You have blood or green matter (bile) in your vomit.  You have blood in your stool or your stool looks black and tarry.  You have not urinated in 6 to 8 hours, or you have only urinated a small amount of very dark urine.  You have a fever.  You faint. MAKE SURE YOU:   Understand these instructions.  Will watch your condition.  Will get help right away if you are not doing well or get worse. Document Released: 03/28/2005 Document Revised: 06/20/2011 Document Reviewed: 11/15/2010 ExitCare Patient Information 2015 [employer]. This information is not intended to replace advice given to you by your health care provider. Make sure you discuss any questions you have with your health care   provider.  

## 2014-07-14 ENCOUNTER — Other Ambulatory Visit: Payer: Self-pay | Admitting: *Deleted

## 2014-07-14 DIAGNOSIS — C3492 Malignant neoplasm of unspecified part of left bronchus or lung: Secondary | ICD-10-CM

## 2014-07-15 ENCOUNTER — Encounter: Payer: Self-pay | Admitting: Physician Assistant

## 2014-07-15 ENCOUNTER — Ambulatory Visit (HOSPITAL_BASED_OUTPATIENT_CLINIC_OR_DEPARTMENT_OTHER): Payer: Medicare Other

## 2014-07-15 ENCOUNTER — Telehealth: Payer: Self-pay | Admitting: Internal Medicine

## 2014-07-15 ENCOUNTER — Ambulatory Visit (HOSPITAL_BASED_OUTPATIENT_CLINIC_OR_DEPARTMENT_OTHER): Payer: Medicare Other | Admitting: Physician Assistant

## 2014-07-15 ENCOUNTER — Other Ambulatory Visit (HOSPITAL_BASED_OUTPATIENT_CLINIC_OR_DEPARTMENT_OTHER): Payer: Medicare Other

## 2014-07-15 VITALS — BP 122/62 | HR 95 | Temp 98.2°F | Resp 18 | Ht 68.0 in | Wt 149.6 lb

## 2014-07-15 DIAGNOSIS — Z5112 Encounter for antineoplastic immunotherapy: Secondary | ICD-10-CM

## 2014-07-15 DIAGNOSIS — C3412 Malignant neoplasm of upper lobe, left bronchus or lung: Secondary | ICD-10-CM | POA: Diagnosis present

## 2014-07-15 DIAGNOSIS — C3492 Malignant neoplasm of unspecified part of left bronchus or lung: Secondary | ICD-10-CM

## 2014-07-15 LAB — CBC WITH DIFFERENTIAL/PLATELET
BASO%: 0.8 % (ref 0.0–2.0)
Basophils Absolute: 0.1 10*3/uL (ref 0.0–0.1)
EOS%: 1.1 % (ref 0.0–7.0)
Eosinophils Absolute: 0.1 10*3/uL (ref 0.0–0.5)
HCT: 33.3 % — ABNORMAL LOW (ref 38.4–49.9)
HEMOGLOBIN: 10.8 g/dL — AB (ref 13.0–17.1)
LYMPH#: 0.9 10*3/uL (ref 0.9–3.3)
LYMPH%: 11.9 % — ABNORMAL LOW (ref 14.0–49.0)
MCH: 29.8 pg (ref 27.2–33.4)
MCHC: 32.4 g/dL (ref 32.0–36.0)
MCV: 92 fL (ref 79.3–98.0)
MONO#: 0.6 10*3/uL (ref 0.1–0.9)
MONO%: 7.9 % (ref 0.0–14.0)
NEUT#: 5.7 10*3/uL (ref 1.5–6.5)
NEUT%: 78.3 % — ABNORMAL HIGH (ref 39.0–75.0)
Platelets: 182 10*3/uL (ref 140–400)
RBC: 3.62 10*6/uL — AB (ref 4.20–5.82)
RDW: 14.4 % (ref 11.0–14.6)
WBC: 7.3 10*3/uL (ref 4.0–10.3)

## 2014-07-15 LAB — COMPREHENSIVE METABOLIC PANEL (CC13)
ALT: 16 U/L (ref 0–55)
AST: 25 U/L (ref 5–34)
Albumin: 2.6 g/dL — ABNORMAL LOW (ref 3.5–5.0)
Alkaline Phosphatase: 182 U/L — ABNORMAL HIGH (ref 40–150)
Anion Gap: 11 mEq/L (ref 3–11)
BUN: 13.4 mg/dL (ref 7.0–26.0)
CALCIUM: 8.8 mg/dL (ref 8.4–10.4)
CHLORIDE: 103 meq/L (ref 98–109)
CO2: 23 mEq/L (ref 22–29)
Creatinine: 0.9 mg/dL (ref 0.7–1.3)
EGFR: 75 mL/min/{1.73_m2} — ABNORMAL LOW (ref >90)
Glucose: 152 mg/dl — ABNORMAL HIGH (ref 70–140)
POTASSIUM: 3.9 meq/L (ref 3.5–5.1)
SODIUM: 137 meq/L (ref 136–145)
Total Bilirubin: 0.77 mg/dL (ref 0.20–1.20)
Total Protein: 7.2 g/dL (ref 6.4–8.3)

## 2014-07-15 MED ORDER — SODIUM CHLORIDE 0.9 % IV SOLN
Freq: Once | INTRAVENOUS | Status: AC
  Administered 2014-07-15: 15:00:00 via INTRAVENOUS

## 2014-07-15 MED ORDER — SODIUM CHLORIDE 0.9 % IV SOLN
2.9000 mg/kg | Freq: Once | INTRAVENOUS | Status: AC
  Administered 2014-07-15: 200 mg via INTRAVENOUS
  Filled 2014-07-15: qty 20

## 2014-07-15 NOTE — Telephone Encounter (Signed)
added appt pt will get print out in tx.

## 2014-07-15 NOTE — Progress Notes (Signed)
1545 Patient complains of pain at IV site to right forearm. Upon inspection IV site noted to be swollen without redness. Blood return noted upon aspiration.IV site discontinued. Pictures taken. Heat applied to site, limb elevated upon protocol. Infiltration instructions provided to patient and family. Patient verbalized understanding. Patient instructed to call our clinic with any further questions or concerns.   Second IV placed with Nivolumab infusing.

## 2014-07-15 NOTE — Progress Notes (Addendum)
[facility]  [phone] [phone]  OFFICE VISIT PROGRESS NOTE  Christopher Roach, [place] [address]  DIAGNOSIS: Recurrent non-small cell lung cancer initially diagnosed as Stage IIIA (T1b, N2, M0) non-small cell lung cancer, adenocarcinoma with negative EGFR mutation and negative ALK gene translocation  PRIOR THERAPY:  1) Concurrent chemoradiation with weekly carboplatin for an AUC of 2 and paclitaxel at 45 mg per meter squared concurrent with radiation therapy under the care of Dr. Lisbeth Renshaw. He status post 7 cycles of chemotherapy. Last dose was given 02/11/2013 with partial response. 2) Systemic chemotherapy with carboplatin for AUC of 5 and Alimta 500 mg/M2 every 3 weeks. First dose on 01/07/2014. Status post 6 cycles. Last dose was given 04/22/2014 with stable disease.  CURRENT THERAPY: Immunotherapy with Nivolumab 3 MG/M2 every 2 weeks. First dose given on 07/01/2014. Status post 1 cycle  DISEASE STAGE: Metastatic non-small cell lung cancer   CHEMOTHERAPY INTENT: Palliative.  CURRENT # OF CHEMOTHERAPY CYCLES: 2  CURRENT ANTIEMETICS: Zofran, dexamethasone, and Compazine for at home use  CURRENT SMOKING STATUS: Former smoker quit 12/20/1992  ORAL CHEMOTHERAPY AND CONSENT: n/a  CURRENT BISPHOSPHONATES USE: None  PAIN MANAGEMENT: Dilaudid  NARCOTICS INDUCED CONSTIPATION: None  LIVING WILL AND CODE STATUS: ?  INTERVAL HISTORY: Christopher Roach 81 y.o. male returns for followup visit accompanied by several family members  He tolerated his first cycle of immunotherapy without significant adverse events. Specifically he had no difficulty with his breathing, no skin rash or diarrhea. He did note a nagging pain in his left hip as well as some right shoulder pain that lasted for a week. He voiced no other specific complaints. He continues to have hoarseness in his voice that predated his treatment with immunotherapy.   The patient denied having any nausea or vomiting. He denied having any fever or chills. He also has lack of appetite and weight loss recently.  MEDICAL HISTORY: Past Medical History  Diagnosis Date  . Hypertension   . Hyperlipidemia   . Hypothyroidism   . Abnormal LFTs   . Gallbladder polyp   . BPH (benign prostatic hyperplasia)   . Actinic keratosis   . Lung nodule     benign BX 03/24/2008  . Anemia   . H/O asbestos exposure   . Pneumonia     hx of walking PNA  . Shortness of breath     with exertion  . Constipation     from medications  . Arthritis   . Kidney stone August 2014    hx of  . Birth mark     left arm and shoulder; Dark purple on upper L chest ;NOT A RASH    ALLERGIES:  is allergic to codeine.  MEDICATIONS:  Current Outpatient Prescriptions  Medication Sig Dispense Refill  . ALPRAZolam (XANAX) 1 MG tablet   0  . amLODipine (NORVASC) 10 MG tablet Take 10 mg by mouth at bedtime.     Marland Kitchen aspirin EC 81 MG tablet Take 81 mg by mouth daily.    . benzonatate (TESSALON) 200 MG capsule Take 1 capsule (200 mg total) by mouth 2 (two) times daily as needed for cough. 40 capsule 0  . chlorproMAZINE (THORAZINE) 25 MG tablet Take 25 mg by mouth 3 (three) times daily as needed.    . dronabinol (MARINOL) 2.5 MG capsule Take 1 capsule (2.5 mg total) by mouth 2 (two) times daily before lunch and supper. 60 capsule 0  .  Eszopiclone (ESZOPICLONE) 3 MG TABS Take 3 mg by mouth at bedtime. Take immediately before bedtime    . folic acid (FOLVITE) 1 MG tablet Take 1 tablet (1 mg total) by mouth daily. 30 tablet 3  . levothyroxine (SYNTHROID, LEVOTHROID) 150 MCG tablet Take 150 mcg by mouth daily.  0  . prochlorperazine (COMPAZINE) 10 MG tablet Take 1 tablet (10 mg total) by mouth every 6 (six) hours as needed for nausea or vomiting. 30 tablet 0  . tamsulosin (FLOMAX) 0.4 MG CAPS Take 0.4 mg by mouth at bedtime.     . traMADol (ULTRAM) 50 MG tablet Take 1 tablet (50 mg total) by mouth  every 6 (six) hours as needed. 30 tablet 0  . traZODone (DESYREL) 50 MG tablet 1 tablet at bedtime as needed.  0   No current facility-administered medications for this visit.    SURGICAL HISTORY:  Past Surgical History  Procedure Laterality Date  . Back surgery      Dr Collie Siad 1978 and 1982  . Cervical disc surgery    . Right foot surgery      plantar fascitis  . Right shoulder surgery      torn rotator cuff  . Right ankle fracture         . Right wrist fracture    . Foot surgery Left   . Tonsillectomy    . Appendectomy    . Colonoscopy    . Flexible bronchoscopy N/A 12/07/2012    Procedure: FLEXIBLE BRONCHOSCOPY;  Surgeon: Gaye Pollack, MD;  Location: [facility] OR;  Service: Thoracic;  Laterality: N/A;  . Video assisted thoracoscopy (vats)/thorocotomy Left 12/07/2012    Procedure: VIDEO ASSISTED THORACOSCOPY (VATS)/ EXPLORATORY THOROCOTOMY;  Surgeon: Gaye Pollack, MD;  Location: [facility] OR;  Service: Thoracic;  Laterality: Left;    REVIEW OF SYSTEMS:  Constitutional: positive for anorexia, fatigue and weight loss Eyes: negative Ears, nose, mouth, throat, and face: positive for hoarseness Respiratory: positive for cough, dyspnea on exertion and pleurisy/chest pain Cardiovascular: negative Gastrointestinal: negative Genitourinary:negative Integument/breast: negative Hematologic/lymphatic: negative Musculoskeletal:positive for arthralgias Neurological: negative Behavioral/Psych: negative Endocrine: negative Allergic/Immunologic: negative   PHYSICAL EXAMINATION: General appearance: alert, cooperative, appears stated age and no distress Head: Normocephalic, without obvious abnormality, atraumatic Neck: no adenopathy, no JVD, supple, symmetrical, trachea midline and thyroid not enlarged, symmetric, no tenderness/mass/nodules Lymph nodes: Cervical, supraclavicular, and axillary nodes normal. Resp: diminished breath sounds LUL and dullness to percussion LUL Cardio: regular rate and  rhythm, S1, S2 normal, no murmur, click, rub or gallop GI: soft, non-tender; bowel sounds normal; no masses,  no organomegaly Extremities: extremities normal, atraumatic, no cyanosis or edema Neurologic: Alert and oriented X 3, normal strength and tone. Normal symmetric reflexes. Normal coordination and gait  ECOG PERFORMANCE STATUS: 1 - Symptomatic but completely ambulatory  Blood pressure 122/62, pulse 95, temperature 98.2 F (36.8 C), temperature source Oral, resp. rate 18, height $RemoveBe'5\' 8"'ARGnKmara$  (1.727 m), weight 149 lb 9.6 oz (67.858 kg), SpO2 97 %.  LABORATORY DATA: Lab Results  Component Value Date   WBC 7.3 07/15/2014   HGB 10.8* 07/15/2014   HCT 33.3* 07/15/2014   MCV 92.0 07/15/2014   PLT 182 07/15/2014      Chemistry      Component Value Date/Time   NA 137 07/15/2014 1308   NA 135* 01/10/2014 1446   K 3.9 07/15/2014 1308   K 4.3 01/10/2014 1446   CL 98 01/10/2014 1446   CO2 23 07/15/2014 1308   CO2 24  01/10/2014 1446   BUN 13.4 07/15/2014 1308   BUN 20 01/10/2014 1446   CREATININE 0.9 07/15/2014 1308   CREATININE 0.90 01/10/2014 1446      Component Value Date/Time   CALCIUM 8.8 07/15/2014 1308   CALCIUM 8.7 01/10/2014 1446   ALKPHOS 182* 07/15/2014 1308   ALKPHOS 129* 12/05/2012 1348   AST 25 07/15/2014 1308   AST 44* 12/05/2012 1348   ALT 16 07/15/2014 1308   ALT 35 12/05/2012 1348   BILITOT 0.77 07/15/2014 1308   BILITOT 0.5 12/05/2012 1348       RADIOGRAPHIC STUDIES: No results found.    ASSESSMENT/PLAN: This is a very pleasant 81 years old white male recently diagnosed with a stage IIIa non-small cell lung cancer currently undergoing concurrent chemoradiation with weekly carboplatin and paclitaxel status post 7 cycles.  He is currently on treatment with systemic chemotherapy with carboplatin and Alimta status post 6 cycles for recurrent disease. The recent CT scan of the chest, abdomen and pelvis showed no evidence for disease progression.  He is  currently being treated with  immunotherapy with Nivolumab 3 MG/KG every 2 weeks. He is now status post 1 cycle and tolerated the first cycle without difficulty. Patient was discussed with and also seen by Dr. Julien Nordmann. He will proceed with cycle #2 today as scheduled. For his weight loss he will continue Marinol 2.5 mg by mouth twice a day. He'll follow-up in 2 weeks prior to cycle #3 of his immunotherapy with Nivolumab. The patient was advised to call immediately if he has any concerning symptoms in the interval. All questions were answered. The patient knows to call the clinic with any problems, questions or concerns. We can certainly see the patient much sooner if necessary.  Disclaimer: This note was dictated with voice recognition software. Similar sounding words can inadvertently be transcribed and may not be corrected upon review.   Carlton Adam, PA-C 07/15/2014     ADDENDUM: Hematology/Oncology Attending:  I had a face to face encounter with the patient. I recommended his care plan. This is a very pleasant 81 years old white male with recurrent non-small cell lung cancer, adenocarcinoma was currently undergoing immunotherapy with Nivolumab status post 1 cycle. He tolerated the first cycle of his treatment fairly well but the patient continues to have baseline hoarseness of his voice as well as left sided chest pain and shortness of breath. He also continues to have poor appetite and he is currently on Marinol 2.5 mg by mouth twice a day. I recommended for the patient to proceed with cycle #2 of his immunotherapy today as a scheduled. He would come back for follow-up visit in 2 weeks for reevaluation before starting cycle #3. He was advised to call immediately if he has any other concerning symptoms in the interval.  Disclaimer: This note was dictated with voice recognition software. Similar sounding words can inadvertently be transcribed and may be missed upon review. Eilleen Kempf., MD 07/20/2014

## 2014-07-15 NOTE — Patient Instructions (Signed)
[facility] Discharge Instructions for Patients Receiving Chemotherapy  Today you received the following chemotherapy agents :  nivolumab  To help prevent nausea and vomiting after your treatment, we encourage you to take your nausea medicationNivolumab injection What is this medicine? NIVOLUMAB (nye VOL ue mab) is used to treat certain types of melanoma and lung cancer. This medicine may be used for other purposes; ask your health care provider or pharmacist if you have questions. COMMON BRAND NAME(S): Opdivo What should I tell my health care provider before I take this medicine? They need to know if you have any of these conditions: -eye disease, vision problems -history of pancreatitis -immune system problems -inflammatory bowel disease -kidney disease -liver disease -lung disease -lupus -myasthenia gravis -multiple sclerosis -organ transplant -stomach or intestine problems -thyroid disease -tingling of the fingers or toes, or other nerve disorder -an unusual or allergic reaction to nivolumab, other medicines, foods, dyes, or preservatives -pregnant or trying to get pregnant -breast-feeding How should I use this medicine? This medicine is for infusion into a vein. It is given by a health care professional in a hospital or clinic setting. A special MedGuide will be given to you before each treatment. Be sure to read this information carefully each time. Talk to your pediatrician regarding the use of this medicine in children. Special care may be needed. Overdosage: If you think you've taken too much of this medicine contact a poison control center or emergency room at once. Overdosage: If you think you have taken too much of this medicine contact a poison control center or emergency room at once. NOTE: This medicine is only for you. Do not share this medicine with others. What if I miss a dose? It is important not to miss your dose. Call your doctor or health care  professional if you are unable to keep an appointment. What may interact with this medicine? Interactions have not been studied. This list may not describe all possible interactions. Give your health care provider a list of all the medicines, herbs, non-prescription drugs, or dietary supplements you use. Also tell them if you smoke, drink alcohol, or use illegal drugs. Some items may interact with your medicine. What should I watch for while using this medicine? Tell your doctor or healthcare professional if your symptoms do not start to get better or if they get worse. Your condition will be monitored carefully while you are receiving this medicine. You may need blood work done while you are taking this medicine. What side effects may I notice from receiving this medicine? Side effects that you should report to your doctor or health care professional as soon as possible: -allergic reactions like skin rash, itching or hives, swelling of the face, lips, or tongue -black, tarry stools -bloody or watery diarrhea -changes in vision -chills -cough -depressed mood -eye pain -feeling anxious -fever -general ill feeling or flu-like symptoms -hair loss -loss of appetite -low blood counts - this medicine may decrease the number of white blood cells, red blood cells and platelets. You may be at increased risk for infections and bleeding -pain, tingling, numbness in the hands or feet -redness, blistering, peeling or loosening of the skin, including inside the mouth -red pinpoint spots on skin -signs of decreased platelets or bleeding - bruising, pinpoint red spots on the skin, black, tarry stools, blood in the urine -signs of decreased red blood cells - unusually weak or tired, feeling faint or lightheaded, falls -signs of infection - fever or  chills, cough, sore throat, pain or trouble passing urine -signs and symptoms of a dangerous change in heartbeat or heart rhythm like chest pain; dizziness;  fast or irregular heartbeat; palpitations; feeling faint or lightheaded, falls; breathing problems -signs and symptoms of high blood sugar such as dizziness; dry mouth; dry skin; fruity breath; nausea; stomach pain; increased hunger or thirst; increased urination -signs and symptoms of kidney injury like trouble passing urine or change in the amount of urine -signs and symptoms of liver injury like dark yellow or brown urine; general ill feeling or flu-like symptoms; light-colored stools; loss of appetite; nausea; right upper belly pain; unusually weak or tired; yellowing of the eyes or skin -signs and symptoms of increased potassium like muscle weakness; chest pain; or fast, irregular heartbeat -signs and symptoms of low potassium like muscle cramps or muscle pain; chest pain; dizziness; feeling faint or lightheaded, falls; palpitations; breathing problems; or fast, irregular heartbeat -swelling of the ankles, feet, hands -weight gainSide effects that usually do not require medical attention (report to your doctor or health care professional if they continue or are bothersome): -constipation -general ill feeling or flu-like symptoms -hair loss -loss of appetite -nausea, vomiting This list may not describe all possible side effects. Call your doctor for medical advice about side effects. You may report side effects to FDA at 1-800-FDA-1088. Where should I keep my medicine? This drug is given in a hospital or clinic and will not be stored at home. NOTE: This sheet is a summary. It may not cover all possible information. If you have questions about this medicine, talk to your doctor, pharmacist, or health care provider.  2015, Elsevier/Gold Standard. (2013-06-17 13:18:19)    If you develop nausea and vomiting that is not controlled by your nausea medication, call the clinic.   BELOW ARE SYMPTOMS THAT SHOULD BE REPORTED IMMEDIATELY:  *FEVER GREATER THAN 100.5 F  *CHILLS WITH OR WITHOUT  FEVER  NAUSEA AND VOMITING THAT IS NOT CONTROLLED WITH YOUR NAUSEA MEDICATION  *UNUSUAL SHORTNESS OF BREATH  *UNUSUAL BRUISING OR BLEEDING  TENDERNESS IN MOUTH AND THROAT WITH OR WITHOUT PRESENCE OF ULCERS  *URINARY PROBLEMS  *BOWEL PROBLEMS  UNUSUAL RASH Items with * indicate a potential emergency and should be followed up as soon as possible.  Feel free to call the clinic you have any questions or concerns. The clinic phone number is [phone].  Please show the [place] at check-in to the Emergency Department and triage nurse.

## 2014-07-16 ENCOUNTER — Ambulatory Visit: Payer: Medicare Other

## 2014-07-16 NOTE — Progress Notes (Signed)
Patient ambulated to infusion room with visitor. Assessed arm, WNL without evidence of infiltration. See extravasation worksheet. Apt given for 07/17/14. Patient ambulated to discharge in stable condition.

## 2014-07-17 ENCOUNTER — Ambulatory Visit (HOSPITAL_BASED_OUTPATIENT_CLINIC_OR_DEPARTMENT_OTHER): Payer: Medicare Other

## 2014-07-17 DIAGNOSIS — C3412 Malignant neoplasm of upper lobe, left bronchus or lung: Secondary | ICD-10-CM | POA: Diagnosis present

## 2014-07-17 NOTE — Progress Notes (Signed)
Right forearm checked. Site within normal limits. No evidence of infiltration seen; area clean, dry, intact. Pt denies any pain or tenderness at site.  Pt ambulatory with visitor at discharge.

## 2014-07-18 ENCOUNTER — Telehealth: Payer: Self-pay | Admitting: *Deleted

## 2014-07-18 DIAGNOSIS — C3492 Malignant neoplasm of unspecified part of left bronchus or lung: Secondary | ICD-10-CM

## 2014-07-18 DIAGNOSIS — C3412 Malignant neoplasm of upper lobe, left bronchus or lung: Secondary | ICD-10-CM

## 2014-07-18 MED ORDER — TRAMADOL HCL 50 MG PO TABS: 50.0000 mg | ORAL_TABLET | Freq: Four times a day (QID) | ORAL | Status: DC | PRN

## 2014-07-18 NOTE — Patient Instructions (Signed)
Continue on Marinol 2.5 mg by mouth twice daily to help stimulate her appetite Follow-up in 2 weeks

## 2014-07-18 NOTE — Telephone Encounter (Signed)
Daughter called requesting refill for pt's Tramadol. Call back to daughter stating medication is being refilled.

## 2014-07-22 ENCOUNTER — Telehealth: Payer: Self-pay | Admitting: Internal Medicine

## 2014-07-22 ENCOUNTER — Ambulatory Visit: Payer: Medicare Other

## 2014-07-22 NOTE — Telephone Encounter (Signed)
returned call and s.w. pt friend and cx appt due to pt sick...pt said that his arm looks fine and he did not want to r/s

## 2014-07-25 ENCOUNTER — Other Ambulatory Visit: Payer: Self-pay | Admitting: *Deleted

## 2014-07-25 MED ORDER — BENZONATATE 200 MG PO CAPS: 200.0000 mg | ORAL_CAPSULE | Freq: Two times a day (BID) | ORAL | Status: DC | PRN

## 2014-07-29 ENCOUNTER — Ambulatory Visit: Payer: Medicare Other | Admitting: Physician Assistant

## 2014-07-29 ENCOUNTER — Telehealth: Payer: Self-pay | Admitting: Internal Medicine

## 2014-07-29 ENCOUNTER — Other Ambulatory Visit: Payer: PRIVATE HEALTH INSURANCE

## 2014-07-29 ENCOUNTER — Ambulatory Visit: Payer: PRIVATE HEALTH INSURANCE

## 2014-07-29 NOTE — Telephone Encounter (Signed)
s.w. pt and cx tx due to being sick he does not want to sched new appt will come on 5.3

## 2014-08-12 ENCOUNTER — Other Ambulatory Visit: Payer: Self-pay | Admitting: Internal Medicine

## 2014-08-12 ENCOUNTER — Ambulatory Visit: Payer: Medicare Other | Admitting: Nutrition

## 2014-08-12 ENCOUNTER — Other Ambulatory Visit: Payer: Medicare Other

## 2014-08-12 ENCOUNTER — Other Ambulatory Visit (HOSPITAL_BASED_OUTPATIENT_CLINIC_OR_DEPARTMENT_OTHER): Payer: Medicare Other

## 2014-08-12 ENCOUNTER — Ambulatory Visit: Payer: PRIVATE HEALTH INSURANCE

## 2014-08-12 ENCOUNTER — Ambulatory Visit (HOSPITAL_BASED_OUTPATIENT_CLINIC_OR_DEPARTMENT_OTHER): Payer: Medicare Other | Admitting: Nurse Practitioner

## 2014-08-12 ENCOUNTER — Telehealth: Payer: Self-pay | Admitting: Nurse Practitioner

## 2014-08-12 ENCOUNTER — Telehealth: Payer: Self-pay | Admitting: Internal Medicine

## 2014-08-12 ENCOUNTER — Ambulatory Visit (HOSPITAL_BASED_OUTPATIENT_CLINIC_OR_DEPARTMENT_OTHER): Payer: Medicare Other

## 2014-08-12 VITALS — BP 133/59 | HR 79 | Temp 98.2°F | Resp 18 | Ht 68.0 in | Wt 144.7 lb

## 2014-08-12 DIAGNOSIS — C3492 Malignant neoplasm of unspecified part of left bronchus or lung: Secondary | ICD-10-CM

## 2014-08-12 DIAGNOSIS — M25552 Pain in left hip: Secondary | ICD-10-CM | POA: Diagnosis not present

## 2014-08-12 DIAGNOSIS — Z5112 Encounter for antineoplastic immunotherapy: Secondary | ICD-10-CM

## 2014-08-12 DIAGNOSIS — C3412 Malignant neoplasm of upper lobe, left bronchus or lung: Secondary | ICD-10-CM

## 2014-08-12 DIAGNOSIS — Z79899 Other long term (current) drug therapy: Secondary | ICD-10-CM | POA: Diagnosis not present

## 2014-08-12 DIAGNOSIS — R5382 Chronic fatigue, unspecified: Secondary | ICD-10-CM

## 2014-08-12 LAB — CBC WITH DIFFERENTIAL/PLATELET
BASO%: 0.8 % (ref 0.0–2.0)
Basophils Absolute: 0.1 10e3/uL (ref 0.0–0.1)
EOS%: 1 % (ref 0.0–7.0)
Eosinophils Absolute: 0.1 10e3/uL (ref 0.0–0.5)
HCT: 32.6 % — ABNORMAL LOW (ref 38.4–49.9)
HGB: 10.4 g/dL — ABNORMAL LOW (ref 13.0–17.1)
LYMPH%: 11.6 % — ABNORMAL LOW (ref 14.0–49.0)
MCH: 28.5 pg (ref 27.2–33.4)
MCHC: 32 g/dL (ref 32.0–36.0)
MCV: 89.1 fL (ref 79.3–98.0)
MONO#: 0.6 10e3/uL (ref 0.1–0.9)
MONO%: 7.9 % (ref 0.0–14.0)
NEUT#: 5.6 10e3/uL (ref 1.5–6.5)
NEUT%: 78.7 % — ABNORMAL HIGH (ref 39.0–75.0)
Platelets: 157 10e3/uL (ref 140–400)
RBC: 3.66 10e6/uL — ABNORMAL LOW (ref 4.20–5.82)
RDW: 15.1 % — ABNORMAL HIGH (ref 11.0–14.6)
WBC: 7.1 10e3/uL (ref 4.0–10.3)
lymph#: 0.8 10e3/uL — ABNORMAL LOW (ref 0.9–3.3)

## 2014-08-12 LAB — COMPREHENSIVE METABOLIC PANEL (CC13)
ALT: 23 U/L (ref 0–55)
AST: 35 U/L — ABNORMAL HIGH (ref 5–34)
Albumin: 2.6 g/dL — ABNORMAL LOW (ref 3.5–5.0)
Alkaline Phosphatase: 198 U/L — ABNORMAL HIGH (ref 40–150)
Anion Gap: 11 meq/L (ref 3–11)
BUN: 11.7 mg/dL (ref 7.0–26.0)
CO2: 25 meq/L (ref 22–29)
Calcium: 8.7 mg/dL (ref 8.4–10.4)
Chloride: 103 meq/L (ref 98–109)
Creatinine: 0.8 mg/dL (ref 0.7–1.3)
EGFR: 84 ml/min/1.73 m2 — ABNORMAL LOW (ref >90)
Glucose: 112 mg/dL (ref 70–140)
Potassium: 4.1 meq/L (ref 3.5–5.1)
Sodium: 139 meq/L (ref 136–145)
Total Bilirubin: 0.56 mg/dL (ref 0.20–1.20)
Total Protein: 6.9 g/dL (ref 6.4–8.3)

## 2014-08-12 LAB — TSH CHCC: TSH: 0.082 m(IU)/L — ABNORMAL LOW (ref 0.320–4.118)

## 2014-08-12 MED ORDER — SODIUM CHLORIDE 0.9 % IV SOLN
2.9000 mg/kg | Freq: Once | INTRAVENOUS | Status: AC
  Administered 2014-08-12: 200 mg via INTRAVENOUS
  Filled 2014-08-12: qty 20

## 2014-08-12 MED ORDER — TRAMADOL HCL 50 MG PO TABS: 50.0000 mg | ORAL_TABLET | Freq: Four times a day (QID) | ORAL | Status: DC | PRN

## 2014-08-12 MED ORDER — SODIUM CHLORIDE 0.9 % IV SOLN
Freq: Once | INTRAVENOUS | Status: AC
  Administered 2014-08-12: 16:00:00 via INTRAVENOUS

## 2014-08-12 NOTE — Telephone Encounter (Signed)
per pof to sch pt appt-sent MW email to sch pt trmt-pt to get updated copy in trmt room

## 2014-08-12 NOTE — Telephone Encounter (Signed)
semt Mohamed email to adv no slots on sch 5/31-adv sch w/Adrena

## 2014-08-12 NOTE — Patient Instructions (Signed)
[facility] Discharge Instructions for Patients Receiving Chemotherapy  Today you received the following chemotherapy agents :  nivolumab  To help prevent nausea and vomiting after your treatment, we encourage you to take your nausea medicationNivolumab injection What is this medicine? NIVOLUMAB (nye VOL ue mab) is used to treat certain types of melanoma and lung cancer. This medicine may be used for other purposes; ask your health care provider or pharmacist if you have questions. COMMON BRAND NAME(S): Opdivo What should I tell my health care provider before I take this medicine? They need to know if you have any of these conditions: -eye disease, vision problems -history of pancreatitis -immune system problems -inflammatory bowel disease -kidney disease -liver disease -lung disease -lupus -myasthenia gravis -multiple sclerosis -organ transplant -stomach or intestine problems -thyroid disease -tingling of the fingers or toes, or other nerve disorder -an unusual or allergic reaction to nivolumab, other medicines, foods, dyes, or preservatives -pregnant or trying to get pregnant -breast-feeding How should I use this medicine? This medicine is for infusion into a vein. It is given by a health care professional in a hospital or clinic setting. A special MedGuide will be given to you before each treatment. Be sure to read this information carefully each time. Talk to your pediatrician regarding the use of this medicine in children. Special care may be needed. Overdosage: If you think you've taken too much of this medicine contact a poison control center or emergency room at once. Overdosage: If you think you have taken too much of this medicine contact a poison control center or emergency room at once. NOTE: This medicine is only for you. Do not share this medicine with others. What if I miss a dose? It is important not to miss your dose. Call your doctor or health care  professional if you are unable to keep an appointment. What may interact with this medicine? Interactions have not been studied. This list may not describe all possible interactions. Give your health care provider a list of all the medicines, herbs, non-prescription drugs, or dietary supplements you use. Also tell them if you smoke, drink alcohol, or use illegal drugs. Some items may interact with your medicine. What should I watch for while using this medicine? Tell your doctor or healthcare professional if your symptoms do not start to get better or if they get worse. Your condition will be monitored carefully while you are receiving this medicine. You may need blood work done while you are taking this medicine. What side effects may I notice from receiving this medicine? Side effects that you should report to your doctor or health care professional as soon as possible: -allergic reactions like skin rash, itching or hives, swelling of the face, lips, or tongue -black, tarry stools -bloody or watery diarrhea -changes in vision -chills -cough -depressed mood -eye pain -feeling anxious -fever -general ill feeling or flu-like symptoms -hair loss -loss of appetite -low blood counts - this medicine may decrease the number of white blood cells, red blood cells and platelets. You may be at increased risk for infections and bleeding -pain, tingling, numbness in the hands or feet -redness, blistering, peeling or loosening of the skin, including inside the mouth -red pinpoint spots on skin -signs of decreased platelets or bleeding - bruising, pinpoint red spots on the skin, black, tarry stools, blood in the urine -signs of decreased red blood cells - unusually weak or tired, feeling faint or lightheaded, falls -signs of infection - fever or   chills, cough, sore throat, pain or trouble passing urine -signs and symptoms of a dangerous change in heartbeat or heart rhythm like chest pain; dizziness;  fast or irregular heartbeat; palpitations; feeling faint or lightheaded, falls; breathing problems -signs and symptoms of high blood sugar such as dizziness; dry mouth; dry skin; fruity breath; nausea; stomach pain; increased hunger or thirst; increased urination -signs and symptoms of kidney injury like trouble passing urine or change in the amount of urine -signs and symptoms of liver injury like dark yellow or brown urine; general ill feeling or flu-like symptoms; light-colored stools; loss of appetite; nausea; right upper belly pain; unusually weak or tired; yellowing of the eyes or skin -signs and symptoms of increased potassium like muscle weakness; chest pain; or fast, irregular heartbeat -signs and symptoms of low potassium like muscle cramps or muscle pain; chest pain; dizziness; feeling faint or lightheaded, falls; palpitations; breathing problems; or fast, irregular heartbeat -swelling of the ankles, feet, hands -weight gainSide effects that usually do not require medical attention (report to your doctor or health care professional if they continue or are bothersome): -constipation -general ill feeling or flu-like symptoms -hair loss -loss of appetite -nausea, vomiting This list may not describe all possible side effects. Call your doctor for medical advice about side effects. You may report side effects to FDA at 1-800-FDA-1088. Where should I keep my medicine? This drug is given in a hospital or clinic and will not be stored at home. NOTE: This sheet is a summary. It may not cover all possible information. If you have questions about this medicine, talk to your doctor, pharmacist, or health care provider.  2015, Elsevier/Gold Standard. (2013-06-17 13:18:19)    If you develop nausea and vomiting that is not controlled by your nausea medication, call the clinic.   BELOW ARE SYMPTOMS THAT SHOULD BE REPORTED IMMEDIATELY:  *FEVER GREATER THAN 100.5 F  *CHILLS WITH OR WITHOUT  FEVER  NAUSEA AND VOMITING THAT IS NOT CONTROLLED WITH YOUR NAUSEA MEDICATION  *UNUSUAL SHORTNESS OF BREATH  *UNUSUAL BRUISING OR BLEEDING  TENDERNESS IN MOUTH AND THROAT WITH OR WITHOUT PRESENCE OF ULCERS  *URINARY PROBLEMS  *BOWEL PROBLEMS  UNUSUAL RASH Items with * indicate a potential emergency and should be followed up as soon as possible.  Feel free to call the clinic you have any questions or concerns. The clinic phone number is [phone].  Please show the CHEMO ALERT CARD at check-in to the Emergency Department and triage nurse.   

## 2014-08-12 NOTE — Progress Notes (Signed)
  [facility] OFFICE PROGRESS NOTE   DIAGNOSIS: Recurrent non-small cell lung cancer initially diagnosed as Stage IIIA (T1b, N2, M0) non-small cell lung cancer, adenocarcinoma with negative EGFR mutation and negative ALK gene translocation  PRIOR THERAPY:  1) Concurrent chemoradiation with weekly carboplatin for an AUC of 2 and paclitaxel at 45 mg per meter squared concurrent with radiation therapy under the care of Dr. Lisbeth Renshaw. He status post 7 cycles of chemotherapy. Last dose was given 02/11/2013 with partial response. 2) Systemic chemotherapy with carboplatin for AUC of 5 and Alimta 500 mg/M2 every 3 weeks. First dose on 01/07/2014. Status post 6 cycles. Last dose was given 04/22/2014 with stable disease.  CURRENT THERAPY: Immunotherapy with Nivolumab 3 MG/M2 every 2 weeks. First dose given on 07/01/2014. Status post 2 cycles.    INTERVAL HISTORY:   Mr. Scicchitano returns for scheduled follow-up. He has completed 2 cycles of Nivolumab. He canceled the scheduled treatment 2 weeks ago after waking up that morning with nausea. He had a few episodes of vomiting. Symptoms lasted for about 1 day. He denies diarrhea. No mouth sores. No rash. He continues to have left hip pain. The pain occurs with weightbearing. He is taking Ultram once a day with good relief. He also takes Advil. He has a poor appetite. He has lost some weight since his last visit. Stable chronic hoarseness. He recently had a fall. No known injury. He denies any unusual headaches. No diplopia.  Objective:  Vital signs in last 24 hours:  Blood pressure 133/59, pulse 79, temperature 98.2 F (36.8 C), temperature source Oral, resp. rate 18, height $RemoveBe'5\' 8"'NJGYOFnZd$  (1.727 m), weight 144 lb 11.2 oz (65.635 kg), SpO2 100 %.    HEENT: No thrush or ulcers. Lymph: No palpable cervical or supraclavicular lymph nodes. Resp: Lungs clear bilaterally. Cardio: Regular rate and rhythm. GI: Soft and nontender. No organomegaly. Vascular: No  edema. Neuro: Alert and oriented. Motor strength 5 over 5. Knee DTRs 2+, symmetric. Finger to nose intact. Gait normal. Skin: Purple birthmark left arm and upper chest.   Lab Results:  Lab Results  Component Value Date   WBC 7.1 08/12/2014   HGB 10.4* 08/12/2014   HCT 32.6* 08/12/2014   MCV 89.1 08/12/2014   PLT 157 08/12/2014   NEUTROABS 5.6 08/12/2014    Imaging:  No results found.  Medications: I have reviewed the patient's current medications.  Assessment/Plan: 1. Recurrent non-small cell lung cancer, adenocarcinoma, currently receiving immunotherapy with Nivolumab, 2 cycles completed to date. 2. Anorexia/weight loss. We discussed increasing calories. He is scheduled to meet with the dietitian today. 3. Left hip pain with weightbearing. Dr. Julien Nordmann recommends proceeding with an MRI of the left hip. He will continue Ultram as needed.   Disposition: Mr. Sidle has completed 2 cycles of Nivolumab. He canceled treatment last week due to onset of nausea/vomiting lasting about 1 day. Plan to resume treatment today with cycle 3 Nivolumab.  He will meet with the dietitian today regarding the anorexia/weight loss.  We made a referral for an MRI of the left hip to evaluate the persistent left hip pain.  He will return for a follow-up visit and cycle 4 Nivolumab in 2 weeks. He will contact the office in the interim with any problems.  Plan reviewed with Dr. Julien Nordmann.      Ned Card ANP/GNP-BC   08/12/2014  3:55 PM

## 2014-08-12 NOTE — Progress Notes (Signed)
81 year old male diagnosed with recurrent non-small cell lung cancer.  Past medical history includes hypertension, hyperlipidemia, hypothyroidism, abnormal LFT, BPH, and anemia.  Medications include Xanax, Thorazine, Marinol, Folvite, Synthroid, and Compazine.  Labs were reviewed.  Height: 68 inches. Weight: 144 pounds May 3. Usual body weight: 168 pounds January 2016.   BMI: 22.01.  Patient was identified to be at risk for malnutrition on the MST secondary to weight loss and poor appetite.  Spoke with patient and daughter in the infusion area.   Patient is currently living alone.   He has nausea and will take one Compazine a day but forgets to take more.   Breakfast seems to be his best meal and he usually eats a good breakfast. Patient will drink ensure Enlive at lunch time. Family and friends bringing over food for patient.   He eats small meals.  Patient meets criteria for severe malnutrition in the context of chronic illness secondary to less than 75% energy intake for greater than one month and 18% weight loss over 5 months.  Nutrition diagnosis: Inadequate oral intake related to poor appetite as evidenced by 24 pound weight loss in 5 months.  Intervention:  Educated patient to try to eat 6 small meals and snacks daily. Recommended patient increase ensure Enlive 3 times a day to 4 times a day. Provided samples and coupons. Recommended patient take nausea medication when he is feeling nauseated. Questions were answered.  Teach back method used. Fact sheets were provided and contact information was given.  Monitoring, evaluation, goals: Patient will tolerate increased oral intake to minimize further weight loss.    Next visit: Tuesday, May 17, during infusion.  **Disclaimer: This note was dictated with voice recognition software. Similar sounding words can inadvertently be transcribed and this note may contain transcription errors which may not have been corrected upon  publication of note.**

## 2014-08-13 ENCOUNTER — Telehealth: Payer: Self-pay | Admitting: Internal Medicine

## 2014-08-13 NOTE — Telephone Encounter (Signed)
per pof to sch pt-reply from Dr. Julien Nordmann stating ok to sch

## 2014-08-15 ENCOUNTER — Telehealth: Payer: Self-pay | Admitting: *Deleted

## 2014-08-15 MED ORDER — MAGIC MOUTHWASH W/LIDOCAINE: 5.0000 mL | Freq: Four times a day (QID) | ORAL | Status: DC

## 2014-08-15 NOTE — Telephone Encounter (Signed)
MOUTH SORES AT RED AND INFLAME. THERE IS ONE SMALL WHITE AREA IN PT.'S MOUTH. NO NAUSEA, VOMITING, OR DIARRHEA. FLUID INTAKE IN THE PAST 24 HOURS HAS BEEN 32 OUNCES. VERBAL ORDER AND READ BACK TO CYNDEE BACON,NP- PT. MAY USE BIOTENE MOUTH RINSES OR BAKING SODA 1/2 TEASPOON AND SALT 1/2 TEASPOON IN EIGHT OUNCES OF WARM WATER SWISH AND SPIT AT LEAST THREE TO FOUR TIMES A DAY. MAY USE AS OFTEN AS EVERY HOUR. ALSO PT. TO USE MAGIC MOUTH [place].  THE ABOVE INSTRUCTIONS GIVEN TO PT.'S DAUGHTER. PRESCRIPTION FOR MAGIC MOUTH [place] CALLED TO PHARMACY. INSTRUCTED PT.'S DAUGHTER TO HAVE HER FATHER FORCE FLUIDS TO 64 OUNCES IN A 24 HOUR PERIOD. SHE VOICES UNDERSTANDING.

## 2014-08-19 ENCOUNTER — Ambulatory Visit (HOSPITAL_COMMUNITY): Admission: RE | Admit: 2014-08-19 | Payer: Medicare Other | Source: Ambulatory Visit

## 2014-08-26 ENCOUNTER — Encounter: Payer: Self-pay | Admitting: Physician Assistant

## 2014-08-26 ENCOUNTER — Ambulatory Visit (HOSPITAL_BASED_OUTPATIENT_CLINIC_OR_DEPARTMENT_OTHER): Payer: Medicare Other | Admitting: Physician Assistant

## 2014-08-26 ENCOUNTER — Other Ambulatory Visit (HOSPITAL_BASED_OUTPATIENT_CLINIC_OR_DEPARTMENT_OTHER): Payer: Medicare Other

## 2014-08-26 ENCOUNTER — Encounter: Payer: Self-pay | Admitting: Oncology

## 2014-08-26 ENCOUNTER — Ambulatory Visit: Payer: Medicare Other | Admitting: Nutrition

## 2014-08-26 ENCOUNTER — Telehealth: Payer: Self-pay | Admitting: Physician Assistant

## 2014-08-26 ENCOUNTER — Ambulatory Visit (HOSPITAL_BASED_OUTPATIENT_CLINIC_OR_DEPARTMENT_OTHER): Payer: Medicare Other

## 2014-08-26 VITALS — BP 111/60 | HR 94 | Temp 98.1°F | Resp 18 | Ht 68.0 in | Wt 144.8 lb

## 2014-08-26 DIAGNOSIS — C3412 Malignant neoplasm of upper lobe, left bronchus or lung: Secondary | ICD-10-CM

## 2014-08-26 DIAGNOSIS — Z5112 Encounter for antineoplastic immunotherapy: Secondary | ICD-10-CM

## 2014-08-26 DIAGNOSIS — K1230 Oral mucositis (ulcerative), unspecified: Secondary | ICD-10-CM | POA: Diagnosis not present

## 2014-08-26 DIAGNOSIS — C3492 Malignant neoplasm of unspecified part of left bronchus or lung: Secondary | ICD-10-CM

## 2014-08-26 LAB — CBC WITH DIFFERENTIAL/PLATELET
BASO%: 0.9 % (ref 0.0–2.0)
Basophils Absolute: 0.1 10*3/uL (ref 0.0–0.1)
EOS ABS: 0.1 10*3/uL (ref 0.0–0.5)
EOS%: 0.8 % (ref 0.0–7.0)
HEMATOCRIT: 31.3 % — AB (ref 38.4–49.9)
HGB: 10.2 g/dL — ABNORMAL LOW (ref 13.0–17.1)
LYMPH%: 8.9 % — AB (ref 14.0–49.0)
MCH: 29.3 pg (ref 27.2–33.4)
MCHC: 32.7 g/dL (ref 32.0–36.0)
MCV: 89.6 fL (ref 79.3–98.0)
MONO#: 0.6 10*3/uL (ref 0.1–0.9)
MONO%: 8.9 % (ref 0.0–14.0)
NEUT#: 5.4 10*3/uL (ref 1.5–6.5)
NEUT%: 80.5 % — ABNORMAL HIGH (ref 39.0–75.0)
PLATELETS: 168 10*3/uL (ref 140–400)
RBC: 3.49 10*6/uL — ABNORMAL LOW (ref 4.20–5.82)
RDW: 15.1 % — ABNORMAL HIGH (ref 11.0–14.6)
WBC: 6.8 10*3/uL (ref 4.0–10.3)
lymph#: 0.6 10*3/uL — ABNORMAL LOW (ref 0.9–3.3)

## 2014-08-26 LAB — COMPREHENSIVE METABOLIC PANEL (CC13)
ALBUMIN: 2.5 g/dL — AB (ref 3.5–5.0)
ALK PHOS: 205 U/L — AB (ref 40–150)
ALT: 23 U/L (ref 0–55)
AST: 36 U/L — AB (ref 5–34)
Anion Gap: 11 mEq/L (ref 3–11)
BUN: 18.4 mg/dL (ref 7.0–26.0)
CALCIUM: 8.8 mg/dL (ref 8.4–10.4)
CHLORIDE: 104 meq/L (ref 98–109)
CO2: 25 mEq/L (ref 22–29)
Creatinine: 0.9 mg/dL (ref 0.7–1.3)
EGFR: 75 mL/min/{1.73_m2} — ABNORMAL LOW (ref >90)
Glucose: 137 mg/dl (ref 70–140)
POTASSIUM: 4.1 meq/L (ref 3.5–5.1)
SODIUM: 140 meq/L (ref 136–145)
Total Bilirubin: 0.53 mg/dL (ref 0.20–1.20)
Total Protein: 6.7 g/dL (ref 6.4–8.3)

## 2014-08-26 MED ORDER — SODIUM CHLORIDE 0.9 % IV SOLN
2.9000 mg/kg | Freq: Once | INTRAVENOUS | Status: AC
  Administered 2014-08-26: 200 mg via INTRAVENOUS
  Filled 2014-08-26: qty 20

## 2014-08-26 MED ORDER — FLUCONAZOLE 100 MG PO TABS: 100.0000 mg | ORAL_TABLET | Freq: Every day | ORAL | Status: DC

## 2014-08-26 MED ORDER — SODIUM CHLORIDE 0.9 % IV SOLN
Freq: Once | INTRAVENOUS | Status: AC
  Administered 2014-08-26: 15:00:00 via INTRAVENOUS

## 2014-08-26 NOTE — Patient Instructions (Addendum)
[facility] Discharge Instructions for Patients Receiving Chemotherapy  Today you received the following chemotherapy agents :  nivolumab  To help prevent nausea and vomiting after your treatment, we encourage you to take your nausea medicationNivolumab injection What is this medicine? NIVOLUMAB (nye VOL ue mab) is used to treat certain types of melanoma and lung cancer. This medicine may be used for other purposes; ask your health care provider or pharmacist if you have questions. COMMON BRAND NAME(S): Opdivo What should I tell my health care provider before I take this medicine? They need to know if you have any of these conditions: -eye disease, vision problems -history of pancreatitis -immune system problems -inflammatory bowel disease -kidney disease -liver disease -lung disease -lupus -myasthenia gravis -multiple sclerosis -organ transplant -stomach or intestine problems -thyroid disease -tingling of the fingers or toes, or other nerve disorder -an unusual or allergic reaction to nivolumab, other medicines, foods, dyes, or preservatives -pregnant or trying to get pregnant -breast-feeding How should I use this medicine? This medicine is for infusion into a vein. It is given by a health care professional in a hospital or clinic setting. A special MedGuide will be given to you before each treatment. Be sure to read this information carefully each time. Talk to your pediatrician regarding the use of this medicine in children. Special care may be needed. Overdosage: If you think you've taken too much of this medicine contact a poison control center or emergency room at once. Overdosage: If you think you have taken too much of this medicine contact a poison control center or emergency room at once. NOTE: This medicine is only for you. Do not share this medicine with others. What if I miss a dose? It is important not to miss your dose. Call your doctor or health care  professional if you are unable to keep an appointment. What may interact with this medicine? Interactions have not been studied. This list may not describe all possible interactions. Give your health care provider a list of all the medicines, herbs, non-prescription drugs, or dietary supplements you use. Also tell them if you smoke, drink alcohol, or use illegal drugs. Some items may interact with your medicine. What should I watch for while using this medicine? Tell your doctor or healthcare professional if your symptoms do not start to get better or if they get worse. Your condition will be monitored carefully while you are receiving this medicine. You may need blood work done while you are taking this medicine. What side effects may I notice from receiving this medicine? Side effects that you should report to your doctor or health care professional as soon as possible: -allergic reactions like skin rash, itching or hives, swelling of the face, lips, or tongue -black, tarry stools -bloody or watery diarrhea -changes in vision -chills -cough -depressed mood -eye pain -feeling anxious -fever -general ill feeling or flu-like symptoms -hair loss -loss of appetite -low blood counts - this medicine may decrease the number of white blood cells, red blood cells and platelets. You may be at increased risk for infections and bleeding -pain, tingling, numbness in the hands or feet -redness, blistering, peeling or loosening of the skin, including inside the mouth -red pinpoint spots on skin -signs of decreased platelets or bleeding - bruising, pinpoint red spots on the skin, black, tarry stools, blood in the urine -signs of decreased red blood cells - unusually weak or tired, feeling faint or lightheaded, falls -signs of infection - fever or   chills, cough, sore throat, pain or trouble passing urine -signs and symptoms of a dangerous change in heartbeat or heart rhythm like chest pain; dizziness;  fast or irregular heartbeat; palpitations; feeling faint or lightheaded, falls; breathing problems -signs and symptoms of high blood sugar such as dizziness; dry mouth; dry skin; fruity breath; nausea; stomach pain; increased hunger or thirst; increased urination -signs and symptoms of kidney injury like trouble passing urine or change in the amount of urine -signs and symptoms of liver injury like dark yellow or brown urine; general ill feeling or flu-like symptoms; light-colored stools; loss of appetite; nausea; right upper belly pain; unusually weak or tired; yellowing of the eyes or skin -signs and symptoms of increased potassium like muscle weakness; chest pain; or fast, irregular heartbeat -signs and symptoms of low potassium like muscle cramps or muscle pain; chest pain; dizziness; feeling faint or lightheaded, falls; palpitations; breathing problems; or fast, irregular heartbeat -swelling of the ankles, feet, hands -weight gainSide effects that usually do not require medical attention (report to your doctor or health care professional if they continue or are bothersome): -constipation -general ill feeling or flu-like symptoms -hair loss -loss of appetite -nausea, vomiting This list may not describe all possible side effects. Call your doctor for medical advice about side effects. You may report side effects to FDA at 1-800-FDA-1088. Where should I keep my medicine? This drug is given in a hospital or clinic and will not be stored at home. NOTE: This sheet is a summary. It may not cover all possible information. If you have questions about this medicine, talk to your doctor, pharmacist, or health care provider.  2015, Elsevier/Gold Standard. (2013-06-17 13:18:19)    If you develop nausea and vomiting that is not controlled by your nausea medication, call the clinic.   BELOW ARE SYMPTOMS THAT SHOULD BE REPORTED IMMEDIATELY:  *FEVER GREATER THAN 100.5 F  *CHILLS WITH OR WITHOUT  FEVER  NAUSEA AND VOMITING THAT IS NOT CONTROLLED WITH YOUR NAUSEA MEDICATION  *UNUSUAL SHORTNESS OF BREATH  *UNUSUAL BRUISING OR BLEEDING  TENDERNESS IN MOUTH AND THROAT WITH OR WITHOUT PRESENCE OF ULCERS  *URINARY PROBLEMS  *BOWEL PROBLEMS  UNUSUAL RASH Items with * indicate a potential emergency and should be followed up as soon as possible.  Feel free to call the clinic you have any questions or concerns. The clinic phone number is [phone].  Please show the CHEMO ALERT CARD at check-in to the Emergency Department and triage nurse.   

## 2014-08-26 NOTE — Patient Instructions (Signed)
Continue using the Magic mouthwash as previously prescribed for your mouth sores. Take the Diflucan as prescribed for your thrush Follow-up in 2 weeks with a restaging CT scan of your chest, abdomen and pelvis to reevaluate your disease

## 2014-08-26 NOTE — Progress Notes (Signed)
Nutrition follow-up completed with patient receiving chemotherapy for recurrent non-small cell lung cancer. Patient is pleased that he has maintained his weight over the past 2 weeks. Weight documented as 144.8 pounds May 17. Patient reports he is eating more. He is drinking ensure once to twice daily.  Nutrition diagnosis: Inadequate oral intake has improved.  Intervention: Encouraged patient to continue small frequent meals and snacks with high-calorie, high-protein foods. Recommended ensure Enlive, twice a day to 3 times a day Provided coupons. Questions answered and teach back method used.  Monitoring, evaluation, goals: Patient has tolerated increased oral intake for weight maintenance.  Next visit: To be scheduled as needed.  **Disclaimer: This note was dictated with voice recognition software. Similar sounding words can inadvertently be transcribed and this note may contain transcription errors which may not have been corrected upon publication of note.**

## 2014-08-26 NOTE — Telephone Encounter (Signed)
per pof to sch pt appt-adv pt [facility] will call to sch CT-pt understood-gave pt contrast

## 2014-08-26 NOTE — Progress Notes (Signed)
[place]  Telephone:([phone] [phone]) [phone]  OFFICE VISIT PROGRESS NOTE  [name], [place] [address]  DIAGNOSIS: Recurrent non-small cell lung cancer initially diagnosed as Stage IIIA (T1b, N2, M0) non-small cell lung cancer, adenocarcinoma with negative EGFR mutation and negative ALK gene translocation  PRIOR THERAPY:  1) Concurrent chemoradiation with weekly carboplatin for an AUC of 2 and paclitaxel at 45 mg per meter squared concurrent with radiation therapy under the care of Dr. Lisbeth Renshaw. He status post 7 cycles of chemotherapy. Last dose was given 02/11/2013 with partial response. 2) Systemic chemotherapy with carboplatin for AUC of 5 and Alimta 500 mg/M2 every 3 weeks. First dose on 01/07/2014. Status post 6 cycles. Last dose was given 04/22/2014 with stable disease.  CURRENT THERAPY: Immunotherapy with Nivolumab 3 MG/M2 every 2 weeks. First dose given on 07/01/2014. Status post 3 cycle  DISEASE STAGE: Metastatic non-small cell lung cancer   CHEMOTHERAPY INTENT: Palliative.  CURRENT # OF CHEMOTHERAPY CYCLES: 4  CURRENT ANTIEMETICS: Zofran, dexamethasone, and Compazine for at home use  CURRENT SMOKING STATUS: Former smoker quit 12/20/1992  ORAL CHEMOTHERAPY AND CONSENT: n/a  CURRENT BISPHOSPHONATES USE: None  PAIN MANAGEMENT: Dilaudid  NARCOTICS INDUCED CONSTIPATION: None  LIVING WILL AND CODE STATUS: ?  INTERVAL HISTORY: Christopher Roach 81 y.o. male returns for followup visit accompanied by a  family member.  He has tolerated his treatment with immunotherapy without significant adverse events. Specifically he had no difficulty with his breathing, no skin rash or diarrhea. He does report some mouth sores and has been using Magic mouthwash for the past week with some improvement. He still has one on the inside of his top limit that is slower to resolve. He reports some constipation but states that it is  manageable. H He continues to have hoarseness in his voice that predated his treatment with immunotherapy.  The patient denied having any nausea or vomiting. He denied having any fever or chills. His appetite is slowly improving and his weight has stabilized. Overall he feels well with a slight increase in his energy level.  MEDICAL HISTORY: Past Medical History  Diagnosis Date  . Hypertension   . Hyperlipidemia   . Hypothyroidism   . Abnormal LFTs   . Gallbladder polyp   . BPH (benign prostatic hyperplasia)   . Actinic keratosis   . Lung nodule     benign BX 03/24/2008  . Anemia   . H/O asbestos exposure   . Pneumonia     hx of walking PNA  . Shortness of breath     with exertion  . Constipation     from medications  . Arthritis   . Kidney stone August 2014    hx of  . Birth mark     left arm and shoulder; Dark purple on upper L chest ;NOT A RASH    ALLERGIES:  is allergic to codeine.  MEDICATIONS:  Current Outpatient Prescriptions  Medication Sig Dispense Refill  . ALPRAZolam (XANAX) 1 MG tablet   0  . Alum & Mag Hydroxide-Simeth (MAGIC MOUTHWASH W/LIDOCAINE) SOLN Take 5 mLs by mouth 4 (four) times daily. SWISH AND SPIT 240 mL 0  . amLODipine (NORVASC) 10 MG tablet Take 10 mg by mouth at bedtime.     Marland Kitchen aspirin EC 81 MG tablet Take 81 mg by mouth daily.    . benzonatate (TESSALON) 200 MG capsule Take 1 capsule (200 mg total) by mouth 2 (two) times  daily as needed for cough. 40 capsule 0  . chlorproMAZINE (THORAZINE) 25 MG tablet Take 25 mg by mouth 3 (three) times daily as needed.    . dronabinol (MARINOL) 2.5 MG capsule Take 1 capsule (2.5 mg total) by mouth 2 (two) times daily before lunch and supper. 60 capsule 0  . Eszopiclone (ESZOPICLONE) 3 MG TABS Take 3 mg by mouth at bedtime. Take immediately before bedtime    . folic acid (FOLVITE) 1 MG tablet Take 1 tablet (1 mg total) by mouth daily. 30 tablet 3  . levothyroxine (SYNTHROID, LEVOTHROID) 150 MCG tablet Take 150  mcg by mouth daily.  0  . prochlorperazine (COMPAZINE) 10 MG tablet Take 1 tablet (10 mg total) by mouth every 6 (six) hours as needed for nausea or vomiting. 30 tablet 0  . tamsulosin (FLOMAX) 0.4 MG CAPS Take 0.4 mg by mouth at bedtime.     . temazepam (RESTORIL) 15 MG capsule   0  . traMADol (ULTRAM) 50 MG tablet Take 1 tablet (50 mg total) by mouth every 6 (six) hours as needed. 30 tablet 0  . traZODone (DESYREL) 50 MG tablet 1 tablet at bedtime as needed.  0  . fluconazole (DIFLUCAN) 100 MG tablet Take 1 tablet (100 mg total) by mouth daily. 10 tablet 0   No current facility-administered medications for this visit.   Facility-Administered Medications Ordered in Other Visits  Medication Dose Route Frequency Provider Last Rate Last Dose  . nivolumab (OPDIVO) 200 mg in sodium chloride 0.9 % 100 mL chemo infusion  2.9 mg/kg (Treatment Plan Actual) Intravenous Once Carlton Adam, PA-C 120 mL/hr at 08/26/14 1518 200 mg at 08/26/14 1518    SURGICAL HISTORY:  Past Surgical History  Procedure Laterality Date  . Back surgery      Dr Collie Siad 1978 and 1982  . Cervical disc surgery    . Right foot surgery      plantar fascitis  . Right shoulder surgery      torn rotator cuff  . Right ankle fracture         . Right wrist fracture    . Foot surgery Left   . Tonsillectomy    . Appendectomy    . Colonoscopy    . Flexible bronchoscopy N/A 12/07/2012    Procedure: FLEXIBLE BRONCHOSCOPY;  Surgeon: Gaye Pollack, MD;  Location: [facility] OR;  Service: Thoracic;  Laterality: N/A;  . Video assisted thoracoscopy (vats)/thorocotomy Left 12/07/2012    Procedure: VIDEO ASSISTED THORACOSCOPY (VATS)/ EXPLORATORY THOROCOTOMY;  Surgeon: Gaye Pollack, MD;  Location: [facility] OR;  Service: Thoracic;  Laterality: Left;    REVIEW OF SYSTEMS:  Constitutional: positive for anorexia, fatigue and weight loss Eyes: negative Ears, nose, mouth, throat, and face: positive for hoarseness and sore mouth Respiratory: positive  for cough and dyspnea on exertion Cardiovascular: negative Gastrointestinal: negative Genitourinary:negative Integument/breast: negative Hematologic/lymphatic: negative Musculoskeletal:positive for arthralgias Neurological: negative Behavioral/Psych: negative Endocrine: negative Allergic/Immunologic: negative   PHYSICAL EXAMINATION: General appearance: alert, cooperative, appears stated age and no distress Head: Normocephalic, without obvious abnormality, atraumatic Neck: no adenopathy, no JVD, supple, symmetrical, trachea midline and thyroid not enlarged, symmetric, no tenderness/mass/nodules Lymph nodes: Cervical, supraclavicular, and axillary nodes normal. Resp: diminished breath sounds LUL and dullness to percussion LUL Cardio: regular rate and rhythm, S1, S2 normal, no murmur, click, rub or gallop GI: soft, non-tender; bowel sounds normal; no masses,  no organomegaly Extremities: extremities normal, atraumatic, no cyanosis or edema Neurologic: Alert and oriented X 3,  normal strength and tone. Normal symmetric reflexes. Normal coordination and gait Mouth reveals mild to moderate thrush affecting the tongue and to a lesser extent the buccal mucosa. There are some resolving areas of mucositis in the largest area is on the inside of the upper lip. There is no active bleeding  ECOG PERFORMANCE STATUS: 1 - Symptomatic but completely ambulatory  Blood pressure 111/60, pulse 94, temperature 98.1 F (36.7 C), temperature source Oral, resp. rate 18, height $RemoveBe'5\' 8"'[id number]$  (1.727 m), weight 144 lb 12.8 oz (65.681 kg), SpO2 99 %.  LABORATORY DATA: Lab Results  Component Value Date   WBC 6.8 08/26/2014   HGB 10.2* 08/26/2014   HCT 31.3* 08/26/2014   MCV 89.6 08/26/2014   PLT 168 08/26/2014      Chemistry      Component Value Date/Time   NA 140 08/26/2014 1310   NA 135* 01/10/2014 1446   K 4.1 08/26/2014 1310   K 4.3 01/10/2014 1446   CL 98 01/10/2014 1446   CO2 25 08/26/2014 1310   CO2  24 01/10/2014 1446   BUN 18.4 08/26/2014 1310   BUN 20 01/10/2014 1446   CREATININE 0.9 08/26/2014 1310   CREATININE 0.90 01/10/2014 1446      Component Value Date/Time   CALCIUM 8.8 08/26/2014 1310   CALCIUM 8.7 01/10/2014 1446   ALKPHOS 205* 08/26/2014 1310   ALKPHOS 129* 12/05/2012 1348   AST 36* 08/26/2014 1310   AST 44* 12/05/2012 1348   ALT 23 08/26/2014 1310   ALT 35 12/05/2012 1348   BILITOT 0.53 08/26/2014 1310   BILITOT 0.5 12/05/2012 1348       RADIOGRAPHIC STUDIES: No results found.    ASSESSMENT/PLAN: This is a very pleasant 81 years old white male recently diagnosed with a stage IIIa non-small cell lung cancer currently undergoing concurrent chemoradiation with weekly carboplatin and paclitaxel status post 7 cycles.  He is currently on treatment with systemic chemotherapy with carboplatin and Alimta status post 6 cycles for recurrent disease. The last CT scan of the chest, abdomen and pelvis showed no evidence for disease progression.  He is currently being treated with  immunotherapy with Nivolumab 3 MG/KG every 2 weeks. He is now status post 3 cycles and is tolerating this treatment without difficulty.  He will proceed with cycle #4 today as scheduled. For his weight loss he will continue Marinol 2.5 mg by mouth twice a day. For the mucositis, the patient is to continue using the Magic mouthwash as previously prescribed. For the oral candidiasis a prescription for Diflucan was sent to his pharmacy of record via E scribe. He'll follow-up in 2 weeks prior to cycle #5 of his immunotherapy with Nivolumab, with a restaging CT scan of the chest, abdomen and pelvis to reevaluate his disease. The patient was advised to call immediately if he has any concerning symptoms in the interval. All questions were answered. The patient knows to call the clinic with any problems, questions or concerns. We can certainly see the patient much sooner if necessary.  Disclaimer: This note was  dictated with voice recognition software. Similar sounding words can inadvertently be transcribed and may not be corrected upon review.   Carlton Adam, PA-C 08/26/2014

## 2014-09-05 ENCOUNTER — Ambulatory Visit (HOSPITAL_COMMUNITY)
Admission: RE | Admit: 2014-09-05 | Discharge: 2014-09-05 | Disposition: A | Discharge status: Home / Self Care | Payer: Medicare Other | Source: Ambulatory Visit | Attending: Physician Assistant | Admitting: Physician Assistant

## 2014-09-05 ENCOUNTER — Encounter (HOSPITAL_COMMUNITY): Payer: Self-pay

## 2014-09-05 DIAGNOSIS — C3492 Malignant neoplasm of unspecified part of left bronchus or lung: Secondary | ICD-10-CM

## 2014-09-05 DIAGNOSIS — C3490 Malignant neoplasm of unspecified part of unspecified bronchus or lung: Secondary | ICD-10-CM | POA: Diagnosis not present

## 2014-09-05 MED ORDER — IOHEXOL 300 MG/ML  SOLN
100.0000 mL | Freq: Once | INTRAMUSCULAR | Status: AC | PRN
  Administered 2014-09-05: 100 mL via INTRAVENOUS

## 2014-09-09 ENCOUNTER — Telehealth: Payer: Self-pay | Admitting: *Deleted

## 2014-09-09 ENCOUNTER — Ambulatory Visit (HOSPITAL_BASED_OUTPATIENT_CLINIC_OR_DEPARTMENT_OTHER): Payer: Medicare Other

## 2014-09-09 ENCOUNTER — Ambulatory Visit (HOSPITAL_BASED_OUTPATIENT_CLINIC_OR_DEPARTMENT_OTHER): Payer: Medicare Other | Admitting: Physician Assistant

## 2014-09-09 ENCOUNTER — Telehealth: Payer: Self-pay | Admitting: Physician Assistant

## 2014-09-09 ENCOUNTER — Encounter: Payer: Self-pay | Admitting: Physician Assistant

## 2014-09-09 ENCOUNTER — Other Ambulatory Visit (HOSPITAL_BASED_OUTPATIENT_CLINIC_OR_DEPARTMENT_OTHER): Payer: Medicare Other

## 2014-09-09 VITALS — BP 114/59 | HR 100 | Temp 98.4°F | Resp 18 | Ht 68.0 in | Wt 144.7 lb

## 2014-09-09 DIAGNOSIS — C3412 Malignant neoplasm of upper lobe, left bronchus or lung: Secondary | ICD-10-CM

## 2014-09-09 DIAGNOSIS — Z5112 Encounter for antineoplastic immunotherapy: Secondary | ICD-10-CM | POA: Diagnosis present

## 2014-09-09 DIAGNOSIS — C3492 Malignant neoplasm of unspecified part of left bronchus or lung: Secondary | ICD-10-CM

## 2014-09-09 DIAGNOSIS — R634 Abnormal weight loss: Secondary | ICD-10-CM | POA: Diagnosis not present

## 2014-09-09 DIAGNOSIS — Z79899 Other long term (current) drug therapy: Secondary | ICD-10-CM | POA: Diagnosis present

## 2014-09-09 DIAGNOSIS — K1230 Oral mucositis (ulcerative), unspecified: Secondary | ICD-10-CM

## 2014-09-09 DIAGNOSIS — R0602 Shortness of breath: Secondary | ICD-10-CM | POA: Diagnosis not present

## 2014-09-09 DIAGNOSIS — R5382 Chronic fatigue, unspecified: Secondary | ICD-10-CM

## 2014-09-09 LAB — COMPREHENSIVE METABOLIC PANEL (CC13)
ALT: 25 U/L (ref 0–55)
AST: 42 U/L — ABNORMAL HIGH (ref 5–34)
Albumin: 2.5 g/dL — ABNORMAL LOW (ref 3.5–5.0)
Alkaline Phosphatase: 217 U/L — ABNORMAL HIGH (ref 40–150)
Anion Gap: 11 mEq/L (ref 3–11)
BUN: 17.1 mg/dL (ref 7.0–26.0)
CALCIUM: 8.5 mg/dL (ref 8.4–10.4)
CHLORIDE: 106 meq/L (ref 98–109)
CO2: 24 mEq/L (ref 22–29)
CREATININE: 0.9 mg/dL (ref 0.7–1.3)
EGFR: 78 mL/min/{1.73_m2} — ABNORMAL LOW (ref >90)
Glucose: 158 mg/dl — ABNORMAL HIGH (ref 70–140)
POTASSIUM: 4.2 meq/L (ref 3.5–5.1)
Sodium: 141 mEq/L (ref 136–145)
Total Bilirubin: 0.53 mg/dL (ref 0.20–1.20)
Total Protein: 6.8 g/dL (ref 6.4–8.3)

## 2014-09-09 LAB — CBC WITH DIFFERENTIAL/PLATELET
BASO%: 1.1 % (ref 0.0–2.0)
BASOS ABS: 0.1 10*3/uL (ref 0.0–0.1)
EOS%: 0.5 % (ref 0.0–7.0)
Eosinophils Absolute: 0 10*3/uL (ref 0.0–0.5)
HCT: 32.3 % — ABNORMAL LOW (ref 38.4–49.9)
HEMOGLOBIN: 10.4 g/dL — AB (ref 13.0–17.1)
LYMPH%: 8.4 % — ABNORMAL LOW (ref 14.0–49.0)
MCH: 28.9 pg (ref 27.2–33.4)
MCHC: 32.2 g/dL (ref 32.0–36.0)
MCV: 89.9 fL (ref 79.3–98.0)
MONO#: 0.5 10*3/uL (ref 0.1–0.9)
MONO%: 7.2 % (ref 0.0–14.0)
NEUT%: 82.8 % — AB (ref 39.0–75.0)
NEUTROS ABS: 6.1 10*3/uL (ref 1.5–6.5)
Platelets: 178 10*3/uL (ref 140–400)
RBC: 3.59 10*6/uL — ABNORMAL LOW (ref 4.20–5.82)
RDW: 15.5 % — AB (ref 11.0–14.6)
WBC: 7.4 10*3/uL (ref 4.0–10.3)
lymph#: 0.6 10*3/uL — ABNORMAL LOW (ref 0.9–3.3)

## 2014-09-09 LAB — T4: T4, Total: 8.4 ug/dL (ref 4.5–12.0)

## 2014-09-09 LAB — TSH CHCC: TSH: 0.133 m(IU)/L — ABNORMAL LOW (ref 0.320–4.118)

## 2014-09-09 LAB — T3: T3, Total: 78.9 ng/dL — ABNORMAL LOW (ref 80.0–204.0)

## 2014-09-09 MED ORDER — MAGIC MOUTHWASH W/LIDOCAINE: 5.0000 mL | Freq: Four times a day (QID) | ORAL | Status: AC

## 2014-09-09 MED ORDER — SODIUM CHLORIDE 0.9 % IV SOLN
Freq: Once | INTRAVENOUS | Status: AC
  Administered 2014-09-09: 15:00:00 via INTRAVENOUS

## 2014-09-09 MED ORDER — SODIUM CHLORIDE 0.9 % IV SOLN
200.0000 mg | Freq: Once | INTRAVENOUS | Status: AC
  Administered 2014-09-09: 200 mg via INTRAVENOUS
  Filled 2014-09-09: qty 20

## 2014-09-09 NOTE — Progress Notes (Signed)
Christopher Roach  [phone] [phone]  OFFICE VISIT PROGRESS NOTE  Christopher Roach, [address] [place] [id number]  DIAGNOSIS: Recurrent non-small cell lung cancer initially diagnosed as Stage IIIA (T1b, N2, M0) non-small cell lung cancer, adenocarcinoma with negative EGFR mutation and negative ALK gene translocation  PRIOR THERAPY:  1) Concurrent chemoradiation with weekly carboplatin for an AUC of 2 and paclitaxel at 45 mg per meter squared concurrent with radiation therapy under the care of Dr. Lisbeth Renshaw. He status post 7 cycles of chemotherapy. Last dose was given 02/11/2013 with partial response. 2) Systemic chemotherapy with carboplatin for AUC of 5 and Alimta 500 mg/M2 every 3 weeks. First dose on 01/07/2014. Status post 6 cycles. Last dose was given 04/22/2014 with stable disease.  CURRENT THERAPY: Immunotherapy with Nivolumab 3 MG/M2 every 2 weeks. First dose given on 07/01/2014. Status post 4 cycle  DISEASE STAGE: Metastatic non-small cell lung cancer   CHEMOTHERAPY INTENT: Palliative.  CURRENT # OF CHEMOTHERAPY CYCLES: 5  CURRENT ANTIEMETICS: Zofran, dexamethasone, and Compazine for at home use  CURRENT SMOKING STATUS: Former smoker quit 12/20/1992  ORAL CHEMOTHERAPY AND CONSENT: n/a  CURRENT BISPHOSPHONATES USE: None  PAIN MANAGEMENT: Dilaudid  NARCOTICS INDUCED CONSTIPATION: None  LIVING WILL AND CODE STATUS: ?  INTERVAL HISTORY: Christopher Roach 81 y.o. male returns for followup visit accompanied by several  family members.  He has tolerated his treatment with immunotherapy without significant adverse events. Specifically he had no difficulty with his breathing, no skin rash or diarrhea. He does report some mouth sores and has been using Magic mouthwash week with some improvement. He requests a refill for the Magic mouthwash. He still has one on the inside of his top lip that is slower to resolve. He continues to  have hoarseness in his voice that predated his treatment with immunotherapy.  The patient denied having any nausea or vomiting. He denied having any fever or chills. His appetite is slowly improving and his weight has stabilized. Overall he feels well with a slight increase in his energy level. He recently to restaging CT scan of the chest, abdomen and pelvis and presents to discuss the results.  MEDICAL HISTORY: Past Medical History  Diagnosis Date  . Hypertension   . Hyperlipidemia   . Hypothyroidism   . Abnormal LFTs   . Gallbladder polyp   . BPH (benign prostatic hyperplasia)   . Actinic keratosis   . Lung nodule     benign BX 03/24/2008  . Anemia   . H/O asbestos exposure   . Pneumonia     hx of walking PNA  . Shortness of breath     with exertion  . Constipation     from medications  . Arthritis   . Kidney stone August 2014    hx of  . Birth mark     left arm and shoulder; Dark purple on upper L chest ;NOT A RASH    ALLERGIES:  is allergic to codeine.  MEDICATIONS:  Current Outpatient Prescriptions  Medication Sig Dispense Refill  . ALPRAZolam (XANAX) 1 MG tablet   0  . Alum & Mag Hydroxide-Simeth (MAGIC MOUTHWASH W/LIDOCAINE) SOLN Take 5 mLs by mouth 4 (four) times daily. SWISH AND SPIT 240 mL 1  . amLODipine (NORVASC) 10 MG tablet Take 10 mg by mouth at bedtime.     Marland Kitchen aspirin EC 81 MG tablet Take 81 mg by mouth daily.    . benzonatate (TESSALON) 200 MG  capsule Take 1 capsule (200 mg total) by mouth 2 (two) times daily as needed for cough. 40 capsule 0  . chlorproMAZINE (THORAZINE) 25 MG tablet Take 25 mg by mouth 3 (three) times daily as needed.    . dronabinol (MARINOL) 2.5 MG capsule Take 1 capsule (2.5 mg total) by mouth 2 (two) times daily before lunch and supper. 60 capsule 0  . Eszopiclone (ESZOPICLONE) 3 MG TABS Take 3 mg by mouth at bedtime. Take immediately before bedtime    . fluconazole (DIFLUCAN) 100 MG tablet Take 1 tablet (100 mg total) by mouth daily.  10 tablet 0  . folic acid (FOLVITE) 1 MG tablet Take 1 tablet (1 mg total) by mouth daily. 30 tablet 3  . levothyroxine (SYNTHROID, LEVOTHROID) 150 MCG tablet Take 150 mcg by mouth daily.  0  . prochlorperazine (COMPAZINE) 10 MG tablet Take 1 tablet (10 mg total) by mouth every 6 (six) hours as needed for nausea or vomiting. 30 tablet 0  . tamsulosin (FLOMAX) 0.4 MG CAPS Take 0.4 mg by mouth at bedtime.     . temazepam (RESTORIL) 15 MG capsule   0  . traMADol (ULTRAM) 50 MG tablet Take 1 tablet (50 mg total) by mouth every 6 (six) hours as needed. 30 tablet 0  . traZODone (DESYREL) 50 MG tablet 1 tablet at bedtime as needed.  0   No current facility-administered medications for this visit.    SURGICAL HISTORY:  Past Surgical History  Procedure Laterality Date  . Back surgery      Dr Collie Siad 1978 and 1982  . Cervical disc surgery    . Right foot surgery      plantar fascitis  . Right shoulder surgery      torn rotator cuff  . Right ankle fracture         . Right wrist fracture    . Foot surgery Left   . Tonsillectomy    . Appendectomy    . Colonoscopy    . Flexible bronchoscopy N/A 12/07/2012    Procedure: FLEXIBLE BRONCHOSCOPY;  Surgeon: Gaye Pollack, MD;  Location: [facility] OR;  Service: Thoracic;  Laterality: N/A;  . Video assisted thoracoscopy (vats)/thorocotomy Left 12/07/2012    Procedure: VIDEO ASSISTED THORACOSCOPY (VATS)/ EXPLORATORY THOROCOTOMY;  Surgeon: Gaye Pollack, MD;  Location: [facility] OR;  Service: Thoracic;  Laterality: Left;    REVIEW OF SYSTEMS:  Constitutional: positive for anorexia, fatigue and weight loss Eyes: negative Ears, nose, mouth, throat, and face: positive for hoarseness and sore mouth Respiratory: positive for cough and dyspnea on exertion Cardiovascular: negative Gastrointestinal: negative Genitourinary:negative Integument/breast: negative Hematologic/lymphatic: negative Musculoskeletal:positive for arthralgias Neurological: negative Behavioral/Psych:  negative Endocrine: negative Allergic/Immunologic: negative   PHYSICAL EXAMINATION: General appearance: alert, cooperative, appears stated age and no distress Head: Normocephalic, without obvious abnormality, atraumatic Neck: no adenopathy, no JVD, supple, symmetrical, trachea midline and thyroid not enlarged, symmetric, no tenderness/mass/nodules Lymph nodes: Cervical, supraclavicular, and axillary nodes normal. Resp: diminished breath sounds LUL and dullness to percussion LUL Cardio: regular rate and rhythm, S1, S2 normal, no murmur, click, rub or gallop GI: soft, non-tender; bowel sounds normal; no masses,  no organomegaly Extremities: extremities normal, atraumatic, no cyanosis or edema Neurologic: Alert and oriented X 3, normal strength and tone. Normal symmetric reflexes. Normal coordination and gait Mouth reveals some resolving areas of mucositis in the largest area is on the inside of the upper lip. There is no active bleeding  ECOG PERFORMANCE STATUS: 1 - Symptomatic but  completely ambulatory  Blood pressure 114/59, pulse 100, temperature 98.4 F (36.9 C), temperature source Oral, resp. rate 18, height $RemoveBe'5\' 8"'augmabPtn$  (1.727 m), weight 144 lb 11.2 oz (65.635 kg), SpO2 100 %.  LABORATORY DATA: Lab Results  Component Value Date   WBC 7.4 09/09/2014   HGB 10.4* 09/09/2014   HCT 32.3* 09/09/2014   MCV 89.9 09/09/2014   PLT 178 09/09/2014      Chemistry      Component Value Date/Time   NA 141 09/09/2014 1312   NA 135* 01/10/2014 1446   K 4.2 09/09/2014 1312   K 4.3 01/10/2014 1446   CL 98 01/10/2014 1446   CO2 24 09/09/2014 1312   CO2 24 01/10/2014 1446   BUN 17.1 09/09/2014 1312   BUN 20 01/10/2014 1446   CREATININE 0.9 09/09/2014 1312   CREATININE 0.90 01/10/2014 1446      Component Value Date/Time   CALCIUM 8.5 09/09/2014 1312   CALCIUM 8.7 01/10/2014 1446   ALKPHOS 217* 09/09/2014 1312   ALKPHOS 129* 12/05/2012 1348   AST 42* 09/09/2014 1312   AST 44* 12/05/2012 1348     ALT 25 09/09/2014 1312   ALT 35 12/05/2012 1348   BILITOT 0.53 09/09/2014 1312   BILITOT 0.5 12/05/2012 1348       RADIOGRAPHIC STUDIES: Ct Chest W Contrast  09/05/2014   CLINICAL DATA:  Subsequent encounter for lung cancer  EXAM: CT CHEST, ABDOMEN, AND PELVIS WITH CONTRAST  TECHNIQUE: Multidetector CT imaging of the chest, abdomen and pelvis was performed following the standard protocol during bolus administration of intravenous contrast.  CONTRAST:  141mL OMNIPAQUE IOHEXOL 300 MG/ML  SOLN  COMPARISON:  None.  FINDINGS: CT CHEST FINDINGS  Mediastinum/Nodes: There is no axillary lymphadenopathy. 13 mm short axis left supraclavicular lymphadenopathy is stable. 12 mm AP window lymph node is unchanged. No right hilar lymphadenopathy. The hila confluent soft tissue attenuation in the left upper hemi thorax measures 6.8 x 3.1 cm today compared to 6.1 x 3.0 cm previously. Reproducible measurement of this area is difficult, no substantial interval change is evident. The abnormal soft tissue attenuation in the left hilum has become more confluent in the interval, but this may be secondary to progressive volume loss of the remaining left lung rather than true soft tissue expansion close continued attention will be required. Left pleural thickening is stable.  Lungs/Pleura: 10 mm subpleural nodule in the right apex (image 12 series 4) is new in the interval. Her are calcified pleural plaque also seen on the right. Collapse/consolidation the left lung has progressed in the interval.  Musculoskeletal: Bone windows reveal no worrisome lytic or sclerotic osseous lesions.  CT ABDOMEN AND PELVIS FINDINGS  Hepatobiliary: Left hepatic lobe appears markedly atrophic. Liver contour nodular. Gallbladder is decompressed. No intrahepatic or extrahepatic biliary dilation.  Pancreas: No focal mass lesion. No dilatation of the main duct. No intraparenchymal cyst. No peripancreatic edema.  Spleen: No splenomegaly. No focal mass  lesion.  Adrenals/Urinary Tract: 12 x 21 mm right adrenal nodule appears new in the interval. 14 mm left adrenal nodule shows no substantial change. Cystic lesions in the right kidney are stable. Left kidney shows no enhancing abnormality. There is no hydroureter. Urinary bladder is nondistended.  Stomach/Bowel: Stomach is unremarkable. Duodenum is normally positioned as is the ligament of Treitz. No small bowel wall thickening. No small bowel dilatation. Terminal ileum is normal. The appendix is not visualized, but there is no edema or inflammation in the region of the  cecum. Diverticular changes are noted in the left colon without evidence of diverticulitis.  Vascular/Lymphatic: There is abdominal aortic atherosclerosis without aneurysm. Penetrating ulcer versus ulcerated plaque noted in the abdominal aorta. No gastrohepatic or hepatoduodenal ligament lymphadenopathy. No retroperitoneal lymphadenopathy. No evidence for pelvic sidewall lymphadenopathy.  Reproductive: Prostate gland is markedly enlarged. Seminal vesicles are unremarkable.  Other: No intraperitoneal free fluid.  Musculoskeletal: Bone windows reveal no worrisome lytic or sclerotic osseous lesions.  IMPRESSION: 1. Increasing collapse/consolidation in the left lung resulting in an interval increase in confluent soft tissue attenuation in the left parahilar region. As disease progression cannot be excluded, close continued attention recommended. 2. Liver parenchyma has morphologic features suggesting cirrhosis. Marked atrophy of the left hepatic lobe noted. 3. New small right adrenal nodule. Continued close attention recommended as metastatic disease is a concern. Left adrenal nodule is stable. 4. Tiny calcified gallstone seen previously is not evident on today's exam. 5. Marked prostatomegaly.   Electronically Signed   By: Misty Stanley M.D.   On: 09/05/2014 16:56   Ct Abdomen Pelvis W Contrast  09/05/2014   CLINICAL DATA:  Subsequent encounter for  lung cancer  EXAM: CT CHEST, ABDOMEN, AND PELVIS WITH CONTRAST  TECHNIQUE: Multidetector CT imaging of the chest, abdomen and pelvis was performed following the standard protocol during bolus administration of intravenous contrast.  CONTRAST:  127mL OMNIPAQUE IOHEXOL 300 MG/ML  SOLN  COMPARISON:  None.  FINDINGS: CT CHEST FINDINGS  Mediastinum/Nodes: There is no axillary lymphadenopathy. 13 mm short axis left supraclavicular lymphadenopathy is stable. 12 mm AP window lymph node is unchanged. No right hilar lymphadenopathy. The hila confluent soft tissue attenuation in the left upper hemi thorax measures 6.8 x 3.1 cm today compared to 6.1 x 3.0 cm previously. Reproducible measurement of this area is difficult, no substantial interval change is evident. The abnormal soft tissue attenuation in the left hilum has become more confluent in the interval, but this may be secondary to progressive volume loss of the remaining left lung rather than true soft tissue expansion close continued attention will be required. Left pleural thickening is stable.  Lungs/Pleura: 10 mm subpleural nodule in the right apex (image 12 series 4) is new in the interval. Her are calcified pleural plaque also seen on the right. Collapse/consolidation the left lung has progressed in the interval.  Musculoskeletal: Bone windows reveal no worrisome lytic or sclerotic osseous lesions.  CT ABDOMEN AND PELVIS FINDINGS  Hepatobiliary: Left hepatic lobe appears markedly atrophic. Liver contour nodular. Gallbladder is decompressed. No intrahepatic or extrahepatic biliary dilation.  Pancreas: No focal mass lesion. No dilatation of the main duct. No intraparenchymal cyst. No peripancreatic edema.  Spleen: No splenomegaly. No focal mass lesion.  Adrenals/Urinary Tract: 12 x 21 mm right adrenal nodule appears new in the interval. 14 mm left adrenal nodule shows no substantial change. Cystic lesions in the right kidney are stable. Left kidney shows no enhancing  abnormality. There is no hydroureter. Urinary bladder is nondistended.  Stomach/Bowel: Stomach is unremarkable. Duodenum is normally positioned as is the ligament of Treitz. No small bowel wall thickening. No small bowel dilatation. Terminal ileum is normal. The appendix is not visualized, but there is no edema or inflammation in the region of the cecum. Diverticular changes are noted in the left colon without evidence of diverticulitis.  Vascular/Lymphatic: There is abdominal aortic atherosclerosis without aneurysm. Penetrating ulcer versus ulcerated plaque noted in the abdominal aorta. No gastrohepatic or hepatoduodenal ligament lymphadenopathy. No retroperitoneal lymphadenopathy. No evidence for  pelvic sidewall lymphadenopathy.  Reproductive: Prostate gland is markedly enlarged. Seminal vesicles are unremarkable.  Other: No intraperitoneal free fluid.  Musculoskeletal: Bone windows reveal no worrisome lytic or sclerotic osseous lesions.  IMPRESSION: 1. Increasing collapse/consolidation in the left lung resulting in an interval increase in confluent soft tissue attenuation in the left parahilar region. As disease progression cannot be excluded, close continued attention recommended. 2. Liver parenchyma has morphologic features suggesting cirrhosis. Marked atrophy of the left hepatic lobe noted. 3. New small right adrenal nodule. Continued close attention recommended as metastatic disease is a concern. Left adrenal nodule is stable. 4. Tiny calcified gallstone seen previously is not evident on today's exam. 5. Marked prostatomegaly.   Electronically Signed   By: Misty Stanley M.D.   On: 09/05/2014 16:56      ASSESSMENT/PLAN: This is a very pleasant 81 years old white male recently diagnosed with a stage IIIa non-small cell lung cancer currently undergoing concurrent chemoradiation with weekly carboplatin and paclitaxel status post 7 cycles.  He is currently on treatment with systemic chemotherapy with  carboplatin and Alimta status post 6 cycles for recurrent disease. The last CT scan of the chest, abdomen and pelvis showed no evidence for disease progression.  He is currently being treated with  immunotherapy with Nivolumab 3 MG/KG every 2 weeks. He is now status post 4 cycles and is tolerating this treatment without difficulty. The patient was discussed with and also seen by Dr. Julien Nordmann. His recent restaging CT scan is relatively stable with perhaps some evidence of pseudo-progression. The CT scan results were discussed with Christopher Roach and his family members. He will continue on immunotherapy with Nivolumab for an additional 4 cycles and then we will check another restaging CT scan. He will proceed with cycle #5 today as scheduled. For his weight loss he will continue Marinol 2.5 mg by mouth twice a day. For the mucositis, the patient is to continue using the Magic mouthwash and a refill prescription was sent to his pharmacy of record via E scribe.  He'll follow-up in 2 weeks prior to cycle #6 of his immunotherapy with Nivolumab.  The patient was advised to call immediately if he has any concerning symptoms in the interval. All questions were answered. The patient knows to call the clinic with any problems, questions or concerns. We can certainly see the patient much sooner if necessary.  Disclaimer: This note was dictated with voice recognition software. Similar sounding words can inadvertently be transcribed and may not be corrected upon review.   Carlton Adam, PA-C 09/09/2014  ADDENDUM: Hematology/Oncology Attending:  I had a face to face encounter with the patient. I recommended his care plan. This is a very pleasant 81 years old white male with metastatic non-small cell lung cancer, adenocarcinoma was currently undergoing treatment with immunotherapy with Nivolumab every 2 weeks is status post 4 cycles. The patient is tolerating his treatment fairly well except for the persistent  shortness of breath as well as hoarseness of his voice. The recent CT scan of the chest showed mild disease progression questionable for pseudo-progression versus real progression but the patient currently has no worsening of his symptoms. I discussed the scan results with the patient and his family. I recommended for him to continue with immunotherapy with Nivolumab for 4 more cycles. He will proceed with cycle #5 today as scheduled. The patient would come back for follow-up visit in 2 weeks for reevaluation before starting cycle #6. For the mucositis, the patient will continue  on Magic mouthwash. He was advised to call immediately if he has any concerning symptoms in the interval.   Disclaimer: This note was dictated with voice recognition software. Similar sounding words can inadvertently be transcribed and may be missed upon review. Eilleen Kempf., MD 09/15/2014

## 2014-09-09 NOTE — Telephone Encounter (Signed)
Pt confirmed labs/ov per 05/31 POF, gave pt AVS and Calendar..... KJ, sent msg to add chemo

## 2014-09-09 NOTE — Patient Instructions (Signed)
[facility] Discharge Instructions for Patients Receiving Chemotherapy  Today you received the following chemotherapy agents Nivolumab  To help prevent nausea and vomiting after your treatment, we encourage you to take your nausea medication     If you develop nausea and vomiting that is not controlled by your nausea medication, call the clinic.   BELOW ARE SYMPTOMS THAT SHOULD BE REPORTED IMMEDIATELY:  *FEVER GREATER THAN 100.5 F  *CHILLS WITH OR WITHOUT FEVER  NAUSEA AND VOMITING THAT IS NOT CONTROLLED WITH YOUR NAUSEA MEDICATION  *UNUSUAL SHORTNESS OF BREATH  *UNUSUAL BRUISING OR BLEEDING  TENDERNESS IN MOUTH AND THROAT WITH OR WITHOUT PRESENCE OF ULCERS  *URINARY PROBLEMS  *BOWEL PROBLEMS  UNUSUAL RASH Items with * indicate a potential emergency and should be followed up as soon as possible.  Feel free to call the clinic you have any questions or concerns. The clinic phone number is [phone].  Please show the CHEMO ALERT CARD at check-in to the Emergency Department and triage nurse.   

## 2014-09-09 NOTE — Telephone Encounter (Signed)
Per staff message and POF I have scheduled appts. Advised scheduler of appts. JMW  

## 2014-09-10 ENCOUNTER — Other Ambulatory Visit: Payer: Self-pay | Admitting: Medical Oncology

## 2014-09-10 NOTE — Patient Instructions (Signed)
Your CT scan revealed relatively stable disease with 1 area for [place] to watch closely on your next CT scan Continue with labs and immunotherapy as scheduled Follow-up in 2 weeks prior to next scheduled cycle of immunotherapy

## 2014-09-10 NOTE — Progress Notes (Signed)
Magic Mouth wash called in -did not go electronically from [facility] yesterday.

## 2014-09-15 DIAGNOSIS — Z5112 Encounter for antineoplastic immunotherapy: Secondary | ICD-10-CM | POA: Insufficient documentation

## 2014-09-16 ENCOUNTER — Other Ambulatory Visit: Payer: Self-pay | Admitting: *Deleted

## 2014-09-16 ENCOUNTER — Telehealth: Payer: Self-pay | Admitting: *Deleted

## 2014-09-16 ENCOUNTER — Other Ambulatory Visit: Payer: Self-pay | Admitting: Internal Medicine

## 2014-09-16 DIAGNOSIS — C3412 Malignant neoplasm of upper lobe, left bronchus or lung: Secondary | ICD-10-CM

## 2014-09-16 NOTE — Telephone Encounter (Signed)
ordered

## 2014-09-16 NOTE — Progress Notes (Unsigned)
Message from triage regarding PAC placement for pt. Reviewed with Adrena PA, order entered.

## 2014-09-16 NOTE — Telephone Encounter (Signed)
PER DR.MOHAMED- AN ORDER AS BEEN SENT TO [facility]VENTIONAL RADIOLOGY FOR PORT A CATH PLACEMENT. NOTIFIED SUSIE MANUEL THAT RADIOLOGY WILL CONTACT HER CONCERNING AN APPOINTMENT FOR THE PORT A CATH PLACEMENT.

## 2014-09-16 NOTE — Telephone Encounter (Signed)
[place] AT PT.'S 09/09/14 VISIT IT WAS DISCUSSED TO HAVE A PORT A CATH PLACED BEFORE PT.'S NEXT TREATMENT WHICH IS 09/23/14. NO ONE HAS CONTACTED PT.'S FAMILY ABOUT THE PORT A CATH PLACEMENT.

## 2014-09-19 ENCOUNTER — Other Ambulatory Visit (HOSPITAL_COMMUNITY): Payer: Self-pay | Admitting: Radiology

## 2014-09-19 ENCOUNTER — Other Ambulatory Visit: Payer: Self-pay | Admitting: Radiology

## 2014-09-22 ENCOUNTER — Ambulatory Visit (HOSPITAL_COMMUNITY)
Admission: RE | Admit: 2014-09-22 | Discharge: 2014-09-22 | Disposition: A | Discharge status: Home / Self Care | Payer: Medicare Other | Source: Ambulatory Visit | Attending: Internal Medicine | Admitting: Internal Medicine

## 2014-09-22 ENCOUNTER — Encounter (HOSPITAL_COMMUNITY): Payer: Self-pay

## 2014-09-22 ENCOUNTER — Other Ambulatory Visit: Payer: Self-pay | Admitting: Internal Medicine

## 2014-09-22 DIAGNOSIS — C3412 Malignant neoplasm of upper lobe, left bronchus or lung: Secondary | ICD-10-CM

## 2014-09-22 DIAGNOSIS — E785 Hyperlipidemia, unspecified: Secondary | ICD-10-CM | POA: Diagnosis not present

## 2014-09-22 DIAGNOSIS — Z87891 Personal history of nicotine dependence: Secondary | ICD-10-CM | POA: Insufficient documentation

## 2014-09-22 DIAGNOSIS — E039 Hypothyroidism, unspecified: Secondary | ICD-10-CM | POA: Insufficient documentation

## 2014-09-22 DIAGNOSIS — Z79899 Other long term (current) drug therapy: Secondary | ICD-10-CM | POA: Insufficient documentation

## 2014-09-22 DIAGNOSIS — N400 Enlarged prostate without lower urinary tract symptoms: Secondary | ICD-10-CM | POA: Insufficient documentation

## 2014-09-22 DIAGNOSIS — Z8249 Family history of ischemic heart disease and other diseases of the circulatory system: Secondary | ICD-10-CM | POA: Diagnosis not present

## 2014-09-22 DIAGNOSIS — Z77090 Contact with and (suspected) exposure to asbestos: Secondary | ICD-10-CM | POA: Insufficient documentation

## 2014-09-22 DIAGNOSIS — I10 Essential (primary) hypertension: Secondary | ICD-10-CM | POA: Diagnosis not present

## 2014-09-22 DIAGNOSIS — M1990 Unspecified osteoarthritis, unspecified site: Secondary | ICD-10-CM | POA: Insufficient documentation

## 2014-09-22 DIAGNOSIS — Z7982 Long term (current) use of aspirin: Secondary | ICD-10-CM | POA: Diagnosis not present

## 2014-09-22 LAB — BASIC METABOLIC PANEL
ANION GAP: 6 (ref 5–15)
BUN: 14 mg/dL (ref 6–20)
CHLORIDE: 108 mmol/L (ref 101–111)
CO2: 26 mmol/L (ref 22–32)
Calcium: 8.4 mg/dL — ABNORMAL LOW (ref 8.9–10.3)
Creatinine, Ser: 0.82 mg/dL (ref 0.61–1.24)
GFR calc Af Amer: >60 mL/min (ref >60)
GFR calc non Af Amer: >60 mL/min (ref >60)
Glucose, Bld: 93 mg/dL (ref 65–99)
Potassium: 3.7 mmol/L (ref 3.5–5.1)
SODIUM: 140 mmol/L (ref 135–145)

## 2014-09-22 LAB — PROTIME-INR
INR: 1.18 (ref 0.00–1.49)
PROTHROMBIN TIME: 15.2 s (ref 11.6–15.2)

## 2014-09-22 LAB — APTT: aPTT: 42 seconds — ABNORMAL HIGH (ref 24–37)

## 2014-09-22 LAB — CBC
HEMATOCRIT: 31.2 % — AB (ref 39.0–52.0)
Hemoglobin: 10 g/dL — ABNORMAL LOW (ref 13.0–17.0)
MCH: 29.4 pg (ref 26.0–34.0)
MCHC: 32.1 g/dL (ref 30.0–36.0)
MCV: 91.8 fL (ref 78.0–100.0)
PLATELETS: 181 10*3/uL (ref 150–400)
RBC: 3.4 MIL/uL — ABNORMAL LOW (ref 4.22–5.81)
RDW: 15.5 % (ref 11.5–15.5)
WBC: 7 10*3/uL (ref 4.0–10.5)

## 2014-09-22 MED ORDER — HEPARIN SOD (PORK) LOCK FLUSH 100 UNIT/ML IV SOLN
INTRAVENOUS | Status: AC
  Filled 2014-09-22: qty 5

## 2014-09-22 MED ORDER — FENTANYL CITRATE (PF) 100 MCG/2ML IJ SOLN
INTRAMUSCULAR | Status: AC
  Filled 2014-09-22: qty 4

## 2014-09-22 MED ORDER — HEPARIN SOD (PORK) LOCK FLUSH 100 UNIT/ML IV SOLN
INTRAVENOUS | Status: AC | PRN
  Administered 2014-09-22: 500 [IU]

## 2014-09-22 MED ORDER — CEFAZOLIN SODIUM-DEXTROSE 2-3 GM-% IV SOLR
2.0000 g | Freq: Once | INTRAVENOUS | Status: AC
  Administered 2014-09-22: 2 g via INTRAVENOUS

## 2014-09-22 MED ORDER — MIDAZOLAM HCL 2 MG/2ML IJ SOLN
INTRAMUSCULAR | Status: AC | PRN
  Administered 2014-09-22 (×2): 0.5 mg via INTRAVENOUS

## 2014-09-22 MED ORDER — LIDOCAINE HCL 1 % IJ SOLN
INTRAMUSCULAR | Status: AC
  Filled 2014-09-22: qty 20

## 2014-09-22 MED ORDER — FENTANYL CITRATE (PF) 100 MCG/2ML IJ SOLN
INTRAMUSCULAR | Status: AC | PRN
  Administered 2014-09-22: 25 ug via INTRAVENOUS

## 2014-09-22 MED ORDER — CEFAZOLIN SODIUM-DEXTROSE 2-3 GM-% IV SOLR
INTRAVENOUS | Status: AC
  Filled 2014-09-22: qty 50

## 2014-09-22 MED ORDER — MIDAZOLAM HCL 2 MG/2ML IJ SOLN
INTRAMUSCULAR | Status: AC
  Filled 2014-09-22: qty 6

## 2014-09-22 MED ORDER — LIDOCAINE-EPINEPHRINE 2 %-1:100000 IJ SOLN
INTRAMUSCULAR | Status: AC
  Filled 2014-09-22: qty 1

## 2014-09-22 MED ORDER — SODIUM CHLORIDE 0.9 % IV SOLN: Freq: Once | INTRAVENOUS | Status: DC

## 2014-09-22 NOTE — H&P (Signed)
Referring Physician(s): Mohamed,Mohamed  History of Present Illness: Christopher Roach is a 81 y.o. male with recurrent non-small cell lung cancer who has been seen by Dr. Julien Nordmann and scheduled today for port a catheter placement. He denies any chest pain, shortness of breath or palpitations. He denies any active signs of bleeding or excessive bruising. He denies any recent fever or chills. The patient denies any history of sleep apnea or chronic oxygen use. He has previously tolerated sedation without complications.   Past Medical History  Diagnosis Date  . Hypertension   . Hyperlipidemia   . Hypothyroidism   . Abnormal LFTs   . Gallbladder polyp   . BPH (benign prostatic hyperplasia)   . Actinic keratosis   . Lung nodule     benign BX 03/24/2008  . Anemia   . H/O asbestos exposure   . Pneumonia     hx of walking PNA  . Shortness of breath     with exertion  . Constipation     from medications  . Arthritis   . Kidney stone August 2014    hx of  . Birth mark     left arm and shoulder; Dark purple on upper L chest ;NOT A RASH    Past Surgical History  Procedure Laterality Date  . Back surgery      Dr Collie Siad 1978 and 1982  . Cervical disc surgery    . Right foot surgery      plantar fascitis  . Right shoulder surgery      torn rotator cuff  . Right ankle fracture         . Right wrist fracture    . Foot surgery Left   . Tonsillectomy    . Appendectomy    . Colonoscopy    . Flexible bronchoscopy N/A 12/07/2012    Procedure: FLEXIBLE BRONCHOSCOPY;  Surgeon: Gaye Pollack, MD;  Location: [facility] OR;  Service: Thoracic;  Laterality: N/A;  . Video assisted thoracoscopy (vats)/thorocotomy Left 12/07/2012    Procedure: VIDEO ASSISTED THORACOSCOPY (VATS)/ EXPLORATORY THOROCOTOMY;  Surgeon: Gaye Pollack, MD;  Location: [place];  Service: Thoracic;  Laterality: Left;    Allergies: Codeine  Medications: Prior to Admission medications   Medication Sig Start Date End Date  Taking? Authorizing Provider  ALPRAZolam Duanne Moron) 1 MG tablet Take 1 mg by mouth daily as needed for anxiety.  03/19/14  Yes Historical Provider, MD  Alum & Mag Hydroxide-Simeth (MAGIC MOUTHWASH W/LIDOCAINE) SOLN Take 5 mLs by mouth 4 (four) times daily. SWISH AND SPIT 09/09/14  Yes Adrena E Johnson, PA-C  amLODipine (NORVASC) 10 MG tablet Take 10 mg by mouth at bedtime.    Yes Historical Provider, MD  aspirin EC 81 MG tablet Take 81 mg by mouth daily.   Yes Historical Provider, MD  Eszopiclone (ESZOPICLONE) 3 MG TABS Take 3 mg by mouth at bedtime.    Yes Historical Provider, MD  fluconazole (DIFLUCAN) 100 MG tablet Take 1 tablet (100 mg total) by mouth daily. 08/26/14  Yes Carlton Adam, PA-C  folic acid (FOLVITE) 1 MG tablet Take 1 tablet (1 mg total) by mouth daily. 06/23/14  Yes Curt Bears, MD  levothyroxine (SYNTHROID, LEVOTHROID) 88 MCG tablet Take 88 mcg by mouth daily before breakfast.   Yes Historical Provider, MD  prochlorperazine (COMPAZINE) 10 MG tablet Take 1 tablet (10 mg total) by mouth every 6 (six) hours as needed for nausea or vomiting. 06/26/14  Yes Curt Bears, MD  tamsulosin (FLOMAX) 0.4 MG CAPS Take 0.4 mg by mouth at bedtime.    Yes Historical Provider, MD  temazepam (RESTORIL) 15 MG capsule Take 15 mg by mouth at bedtime.  08/05/14  Yes Historical Provider, MD  traMADol (ULTRAM) 50 MG tablet Take 1 tablet (50 mg total) by mouth every 6 (six) hours as needed. 08/12/14  Yes Owens Shark, NP  benzonatate (TESSALON) 200 MG capsule Take 1 capsule (200 mg total) by mouth 2 (two) times daily as needed for cough. Patient not taking: Reported on 09/18/2014 07/25/14   Curt Bears, MD  dronabinol (MARINOL) 2.5 MG capsule Take 1 capsule (2.5 mg total) by mouth 2 (two) times daily before lunch and supper. Patient not taking: Reported on 09/18/2014 06/26/14   Curt Bears, MD     Family History  Problem Relation Age of Onset  . Hypertension Father   . Heart disease Father      smoker  . Alzheimer's disease Mother   . Breast cancer Sister   . Skin cancer Brother   . Heart disease Son     History   Social History  . Marital Status: Widowed    Spouse Name: N/A  . Number of Children: N/A  . Years of Education: N/A   Occupational History  . retired     from [place]  . Smoking status: Former Smoker -- 1.00 packs/day for 40 years    Types: Cigarettes    Quit date: 12/20/1992  . Smokeless tobacco: Not on file  . Alcohol Use: No  . Drug Use: No  . Sexual Activity: Not on file   Other Topics Concern  . 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  All other systems are negative.  Review of Systems  Vital Signs: BP 111/56 mmHg  Pulse 81  Temp(Src) 98.2 F (36.8 C) (Oral)  Resp 20  Ht '5\' 10"'$  (1.778 m)  Wt 144 lb (65.318 kg)  BMI 20.66 kg/m2  SpO2 99%  Physical Exam  Constitutional: He is oriented to person, place, and time. No distress.  HENT:  Head: Normocephalic and atraumatic.  Neck: No tracheal deviation present.  Cardiovascular: Normal rate and regular rhythm.  Exam reveals no gallop and no friction rub.   No murmur heard. Pulmonary/Chest: Effort normal and breath sounds normal. No respiratory distress. He has no wheezes. He has no rales.  Abdominal: Soft. Bowel sounds are normal. He exhibits no distension. There is no tenderness.  Neurological: He is alert and oriented to person, place, and time.  Skin: He is not diaphoretic.    Mallampati Score:  MD Evaluation Airway: WNL Heart: WNL Abdomen: WNL Chest/ Lungs: WNL ASA  Classification: 3 Mallampati/Airway Score: Two  Imaging: Ct Chest W Contrast  09/05/2014   CLINICAL DATA:  Subsequent encounter for lung cancer  EXAM: CT CHEST, ABDOMEN,  AND PELVIS WITH CONTRAST  TECHNIQUE: Multidetector CT imaging of the chest, abdomen and pelvis was performed following the standard protocol during bolus administration of intravenous contrast.  CONTRAST:  135m OMNIPAQUE IOHEXOL 300 MG/ML  SOLN  COMPARISON:  None.  FINDINGS: CT CHEST FINDINGS  Mediastinum/Nodes: There is no axillary lymphadenopathy. 13 mm short axis left supraclavicular lymphadenopathy is stable. 12 mm  AP window lymph node is unchanged. No right hilar lymphadenopathy. The hila confluent soft tissue attenuation in the left upper hemi thorax measures 6.8 x 3.1 cm today compared to 6.1 x 3.0 cm previously. Reproducible measurement of this area is difficult, no substantial interval change is evident. The abnormal soft tissue attenuation in the left hilum has become more confluent in the interval, but this may be secondary to progressive volume loss of the remaining left lung rather than true soft tissue expansion close continued attention will be required. Left pleural thickening is stable.  Lungs/Pleura: 10 mm subpleural nodule in the right apex (image 12 series 4) is new in the interval. Her are calcified pleural plaque also seen on the right. Collapse/consolidation the left lung has progressed in the interval.  Musculoskeletal: Bone windows reveal no worrisome lytic or sclerotic osseous lesions.  CT ABDOMEN AND PELVIS FINDINGS  Hepatobiliary: Left hepatic lobe appears markedly atrophic. Liver contour nodular. Gallbladder is decompressed. No intrahepatic or extrahepatic biliary dilation.  Pancreas: No focal mass lesion. No dilatation of the main duct. No intraparenchymal cyst. No peripancreatic edema.  Spleen: No splenomegaly. No focal mass lesion.  Adrenals/Urinary Tract: 12 x 21 mm right adrenal nodule appears new in the interval. 14 mm left adrenal nodule shows no substantial change. Cystic lesions in the right kidney are stable. Left kidney shows no enhancing abnormality. There is no hydroureter.  Urinary bladder is nondistended.  Stomach/Bowel: Stomach is unremarkable. Duodenum is normally positioned as is the ligament of Treitz. No small bowel wall thickening. No small bowel dilatation. Terminal ileum is normal. The appendix is not visualized, but there is no edema or inflammation in the region of the cecum. Diverticular changes are noted in the left colon without evidence of diverticulitis.  Vascular/Lymphatic: There is abdominal aortic atherosclerosis without aneurysm. Penetrating ulcer versus ulcerated plaque noted in the abdominal aorta. No gastrohepatic or hepatoduodenal ligament lymphadenopathy. No retroperitoneal lymphadenopathy. No evidence for pelvic sidewall lymphadenopathy.  Reproductive: Prostate gland is markedly enlarged. Seminal vesicles are unremarkable.  Other: No intraperitoneal free fluid.  Musculoskeletal: Bone windows reveal no worrisome lytic or sclerotic osseous lesions.  IMPRESSION: 1. Increasing collapse/consolidation in the left lung resulting in an interval increase in confluent soft tissue attenuation in the left parahilar region. As disease progression cannot be excluded, close continued attention recommended. 2. Liver parenchyma has morphologic features suggesting cirrhosis. Marked atrophy of the left hepatic lobe noted. 3. New small right adrenal nodule. Continued close attention recommended as metastatic disease is a concern. Left adrenal nodule is stable. 4. Tiny calcified gallstone seen previously is not evident on today's exam. 5. Marked prostatomegaly.   Electronically Signed   By: Misty Stanley M.D.   On: 09/05/2014 16:56   Ct Abdomen Pelvis W Contrast  09/05/2014   CLINICAL DATA:  Subsequent encounter for lung cancer  EXAM: CT CHEST, ABDOMEN, AND PELVIS WITH CONTRAST  TECHNIQUE: Multidetector CT imaging of the chest, abdomen and pelvis was performed following the standard protocol during bolus administration of intravenous contrast.  CONTRAST:  133m OMNIPAQUE  IOHEXOL 300 MG/ML  SOLN  COMPARISON:  None.  FINDINGS: CT CHEST FINDINGS  Mediastinum/Nodes: There is no axillary lymphadenopathy. 13 mm short axis left supraclavicular lymphadenopathy is stable. 12 mm AP window lymph node is unchanged. No right hilar lymphadenopathy. The hila confluent soft tissue attenuation in the left upper hemi thorax measures 6.8 x 3.1 cm today compared to 6.1 x 3.0 cm previously. Reproducible measurement of this area is difficult, no substantial  interval change is evident. The abnormal soft tissue attenuation in the left hilum has become more confluent in the interval, but this may be secondary to progressive volume loss of the remaining left lung rather than true soft tissue expansion close continued attention will be required. Left pleural thickening is stable.  Lungs/Pleura: 10 mm subpleural nodule in the right apex (image 12 series 4) is new in the interval. Her are calcified pleural plaque also seen on the right. Collapse/consolidation the left lung has progressed in the interval.  Musculoskeletal: Bone windows reveal no worrisome lytic or sclerotic osseous lesions.  CT ABDOMEN AND PELVIS FINDINGS  Hepatobiliary: Left hepatic lobe appears markedly atrophic. Liver contour nodular. Gallbladder is decompressed. No intrahepatic or extrahepatic biliary dilation.  Pancreas: No focal mass lesion. No dilatation of the main duct. No intraparenchymal cyst. No peripancreatic edema.  Spleen: No splenomegaly. No focal mass lesion.  Adrenals/Urinary Tract: 12 x 21 mm right adrenal nodule appears new in the interval. 14 mm left adrenal nodule shows no substantial change. Cystic lesions in the right kidney are stable. Left kidney shows no enhancing abnormality. There is no hydroureter. Urinary bladder is nondistended.  Stomach/Bowel: Stomach is unremarkable. Duodenum is normally positioned as is the ligament of Treitz. No small bowel wall thickening. No small bowel dilatation. Terminal ileum is normal.  The appendix is not visualized, but there is no edema or inflammation in the region of the cecum. Diverticular changes are noted in the left colon without evidence of diverticulitis.  Vascular/Lymphatic: There is abdominal aortic atherosclerosis without aneurysm. Penetrating ulcer versus ulcerated plaque noted in the abdominal aorta. No gastrohepatic or hepatoduodenal ligament lymphadenopathy. No retroperitoneal lymphadenopathy. No evidence for pelvic sidewall lymphadenopathy.  Reproductive: Prostate gland is markedly enlarged. Seminal vesicles are unremarkable.  Other: No intraperitoneal free fluid.  Musculoskeletal: Bone windows reveal no worrisome lytic or sclerotic osseous lesions.  IMPRESSION: 1. Increasing collapse/consolidation in the left lung resulting in an interval increase in confluent soft tissue attenuation in the left parahilar region. As disease progression cannot be excluded, close continued attention recommended. 2. Liver parenchyma has morphologic features suggesting cirrhosis. Marked atrophy of the left hepatic lobe noted. 3. New small right adrenal nodule. Continued close attention recommended as metastatic disease is a concern. Left adrenal nodule is stable. 4. Tiny calcified gallstone seen previously is not evident on today's exam. 5. Marked prostatomegaly.   Electronically Signed   By: Misty Stanley M.D.   On: 09/05/2014 16:56    Labs:  CBC:  Recent Labs  07/15/14 1308 08/12/14 1414 08/26/14 1310 09/09/14 1312  WBC 7.3 7.1 6.8 7.4  HGB 10.8* 10.4* 10.2* 10.4*  HCT 33.3* 32.6* 31.3* 32.3*  PLT 182 157 168 178    COAGS: No results for input(s): INR, APTT in the last 8760 hours.  BMP:  Recent Labs  01/10/14 1446  07/15/14 1308 08/12/14 1414 08/26/14 1310 09/09/14 1312  NA 135*  < > 137 139 140 141  K 4.3  < > 3.9 4.1 4.1 4.2  CL 98  --   --   --   --   --   CO2 24  < > '23 25 25 24  '$ GLUCOSE 111*  < > 152* 112 137 158*  BUN 20  < > 13.4 11.7 18.4 17.1  CALCIUM  8.7  < > 8.8 8.7 8.8 8.5  CREATININE 0.90  < > 0.9 0.8 0.9 0.9  GFRNONAA 78*  --   --   --   --   --  GFRAA >90  --   --   --   --   --   < > = values in this interval not displayed.  LIVER FUNCTION TESTS:  Recent Labs  07/15/14 1308 08/12/14 1414 08/26/14 1310 09/09/14 1312  BILITOT 0.77 0.56 0.53 0.53  AST 25 35* 36* 42*  ALT '16 23 23 25  '$ ALKPHOS 182* 198* 205* 217*  PROT 7.2 6.9 6.7 6.8  ALBUMIN 2.6* 2.6* 2.5* 2.5*   Assessment and Plan: Recurrent non-small cell lung cancer Scheduled today for image guided port a catheter placement with sedation The patient has been NPO, no blood thinners taken, labs and vitals have been reviewed. Risks and Benefits discussed with the patient including, but not limited to bleeding, infection, pneumothorax, or fibrin sheath development and need for additional procedures. All of the patient's questions were answered, patient is agreeable to proceed. Consent signed and in chart.    Thank you for this interesting consult.  I greatly enjoyed meeting Christopher Roach and look forward to participating in their care.  SignedHedy Jacob 09/22/2014, 12:50 PM   I spent a total of 30 minutes in face to face in clinical consultation, greater than 50% of which was counseling/coordinating care for lung cancer.

## 2014-09-22 NOTE — Procedures (Signed)
R IJ Port cathter placement with US and fluoroscopy No complication No blood loss. See complete dictation in [facility] PACS.  

## 2014-09-22 NOTE — Discharge Instructions (Signed)
Implanted Port Insertion, Care After °Refer to this sheet in the next few weeks. These instructions provide you with information on caring for yourself after your procedure. Your health care provider may also give you more specific instructions. Your treatment has been planned according to current medical practices, but problems sometimes occur. Call your health care provider if you have any problems or questions after your procedure. °WHAT TO EXPECT AFTER THE PROCEDURE °After your procedure, it is typical to have the following:  °· Discomfort at the port insertion site. Ice packs to the area will help. °· Bruising on the skin over the port. This will subside in 3-4 days. °HOME CARE INSTRUCTIONS °· After your port is placed, you will get a manufacturer's information card. The card has information about your port. Keep this card with you at all times.   °· Know what kind of port you have. There are many types of ports available.   °· Wear a medical alert bracelet in case of an emergency. This can help alert health care workers that you have a port.   °· The port can stay in for as long as your health care provider believes it is necessary.   °· A home health care nurse may give medicines and take care of the port.   °· You or a family member can get special training and directions for giving medicine and taking care of the port at home.   °SEEK MEDICAL CARE IF:  °· Your port does not flush or you are unable to get a blood return.   °· You have a fever or chills. °SEEK IMMEDIATE MEDICAL CARE IF: °· You have new fluid or pus coming from your incision.   °· You notice a bad smell coming from your incision site.   °· You have swelling, pain, or more redness at the incision or port site.   °· You have chest pain or shortness of breath. °Document Released: 01/16/2013 Document Revised: 04/02/2013 Document Reviewed: 01/16/2013 °[employer]® Patient Information ©2015 [employer]. This information is not intended to replace  advice given to you by your health care provider. Make sure you discuss any questions you have with your health care provider. °Conscious Sedation, Adult, Care After °Refer to this sheet in the next few weeks. These instructions provide you with information on caring for yourself after your procedure. Your health care provider may also give you more specific instructions. Your treatment has been planned according to current medical practices, but problems sometimes occur. Call your health care provider if you have any problems or questions after your procedure. °WHAT TO EXPECT AFTER THE PROCEDURE  °After your procedure: °· You may feel sleepy, clumsy, and have poor balance for several hours. °· Vomiting may occur if you eat too soon after the procedure. °HOME CARE INSTRUCTIONS °· Do not participate in any activities where you could become injured for at least 24 hours. Do not: °¨ Drive. °¨ Swim. °¨ Ride a bicycle. °¨ Operate heavy machinery. °¨ Cook. °¨ Use power tools. °¨ Climb ladders. °¨ Work from a high place. °· Do not make important decisions or sign legal documents until you are improved. °· If you vomit, drink water, juice, or soup when you can drink without vomiting. Make sure you have little or no nausea before eating solid foods. °· Only take over-the-counter or prescription medicines for pain, discomfort, or fever as directed by your health care provider. °· Make sure you and your family fully understand everything about the medicines given to you, including what side effects   may occur. °· You should not drink alcohol, take sleeping pills, or take medicines that cause drowsiness for at least 24 hours. °· If you smoke, do not smoke without supervision. °· If you are feeling better, you may resume normal activities 24 hours after you were sedated. °· Keep all appointments with your health care provider. °SEEK MEDICAL CARE IF: °· Your skin is pale or bluish in color. °· You continue to feel nauseous or  vomit. °· Your pain is getting worse and is not helped by medicine. °· You have bleeding or swelling. °· You are still sleepy or feeling clumsy after 24 hours. °SEEK IMMEDIATE MEDICAL CARE IF: °· You develop a rash. °· You have difficulty breathing. °· You develop any type of allergic problem. °· You have a fever. °MAKE SURE YOU: °· Understand these instructions. °· Will watch your condition. °· Will get help right away if you are not doing well or get worse. °Document Released: 01/16/2013 Document Reviewed: 01/16/2013 °Exit[employer]® Patient Information ©2015 [employer]. This information is not intended to replace advice given to you by your health care provider. Make sure you discuss any questions you have with your health care provider. ° ° °

## 2014-09-23 ENCOUNTER — Telehealth: Payer: Self-pay | Admitting: Oncology

## 2014-09-23 ENCOUNTER — Other Ambulatory Visit: Payer: Medicare Other

## 2014-09-23 ENCOUNTER — Encounter: Payer: Self-pay | Admitting: Oncology

## 2014-09-23 ENCOUNTER — Ambulatory Visit (HOSPITAL_BASED_OUTPATIENT_CLINIC_OR_DEPARTMENT_OTHER): Payer: Medicare Other

## 2014-09-23 ENCOUNTER — Ambulatory Visit (HOSPITAL_BASED_OUTPATIENT_CLINIC_OR_DEPARTMENT_OTHER): Payer: Medicare Other | Admitting: Oncology

## 2014-09-23 VITALS — BP 113/58 | HR 74 | Temp 97.3°F | Resp 18 | Ht 70.0 in | Wt 150.1 lb

## 2014-09-23 DIAGNOSIS — C3412 Malignant neoplasm of upper lobe, left bronchus or lung: Secondary | ICD-10-CM | POA: Diagnosis present

## 2014-09-23 DIAGNOSIS — R0602 Shortness of breath: Secondary | ICD-10-CM | POA: Diagnosis not present

## 2014-09-23 DIAGNOSIS — R490 Dysphonia: Secondary | ICD-10-CM | POA: Diagnosis not present

## 2014-09-23 DIAGNOSIS — Z5112 Encounter for antineoplastic immunotherapy: Secondary | ICD-10-CM | POA: Diagnosis present

## 2014-09-23 DIAGNOSIS — C3492 Malignant neoplasm of unspecified part of left bronchus or lung: Secondary | ICD-10-CM

## 2014-09-23 MED ORDER — LIDOCAINE-PRILOCAINE 2.5-2.5 % EX CREA: 1.0000 "application " | TOPICAL_CREAM | CUTANEOUS | Status: AC | PRN

## 2014-09-23 MED ORDER — NIVOLUMAB CHEMO INJECTION 100 MG/10ML
200.0000 mg | Freq: Once | INTRAVENOUS | Status: AC
  Administered 2014-09-23: 200 mg via INTRAVENOUS
  Filled 2014-09-23: qty 20

## 2014-09-23 MED ORDER — TRAMADOL HCL 50 MG PO TABS: 50.0000 mg | ORAL_TABLET | Freq: Four times a day (QID) | ORAL | Status: DC | PRN

## 2014-09-23 MED ORDER — SODIUM CHLORIDE 0.9 % IJ SOLN
10.0000 mL | INTRAMUSCULAR | Status: DC | PRN
  Administered 2014-09-23: 10 mL
  Filled 2014-09-23: qty 10

## 2014-09-23 MED ORDER — HEPARIN SOD (PORK) LOCK FLUSH 100 UNIT/ML IV SOLN
500.0000 [IU] | Freq: Once | INTRAVENOUS | Status: AC | PRN
  Administered 2014-09-23: 500 [IU]
  Filled 2014-09-23: qty 5

## 2014-09-23 MED ORDER — SODIUM CHLORIDE 0.9 % IV SOLN
Freq: Once | INTRAVENOUS | Status: AC
  Administered 2014-09-23: 15:00:00 via INTRAVENOUS

## 2014-09-23 NOTE — Progress Notes (Signed)
[place]  [phone] Fax:([phone]) [phone]  OFFICE VISIT PROGRESS NOTE  [name], [place] [address]  DIAGNOSIS: Recurrent non-small cell lung cancer initially diagnosed as Stage IIIA (T1b, N2, M0) non-small cell lung cancer, adenocarcinoma with negative EGFR mutation and negative ALK gene translocation  PRIOR THERAPY:  1) Concurrent chemoradiation with weekly carboplatin for an AUC of 2 and paclitaxel at 45 mg per meter squared concurrent with radiation therapy under the care of Dr. Lisbeth Renshaw. He status post 7 cycles of chemotherapy. Last dose was given 02/11/2013 with partial response. 2) Systemic chemotherapy with carboplatin for AUC of 5 and Alimta 500 mg/M2 every 3 weeks. First dose on 01/07/2014. Status post 6 cycles. Last dose was given 04/22/2014 with stable disease.  CURRENT THERAPY: Immunotherapy with Nivolumab 3 MG/M2 every 2 weeks. First dose given on 07/01/2014. Status post 5 cycles  DISEASE STAGE: Metastatic non-small cell lung cancer   CHEMOTHERAPY INTENT: Palliative.  CURRENT # OF CHEMOTHERAPY CYCLES: 6  CURRENT ANTIEMETICS: Zofran, dexamethasone, and Compazine for at home use  CURRENT SMOKING STATUS: Former smoker quit 12/20/1992  ORAL CHEMOTHERAPY AND CONSENT: n/a  CURRENT BISPHOSPHONATES USE: None  PAIN MANAGEMENT: Tramadol  NARCOTICS INDUCED CONSTIPATION: None  LIVING WILL AND CODE STATUS: ?  INTERVAL HISTORY: Christopher Roach 81 y.o. male returns for followup visit accompanied by several  family members.  He has tolerated his treatment with immunotherapy without significant adverse events. He had a PAC placed yesterday due to poor venous access. Tolerated well, but still fatigued from procedure. He had no difficulty with his breathing, no skin rash or diarrhea. He does report some mouth sores and has been using Magic mouthwash week with some improvement. He continues to have hoarseness in his  voice that predated his treatment with immunotherapy.  The patient denied having any nausea or vomiting. He denied having any fever or chills. His appetite is slowly improving and he is gaining weight. Overall he feels well with a slight increase in his energy level.   MEDICAL HISTORY: Past Medical History  Diagnosis Date  . Hypertension   . Hyperlipidemia   . Hypothyroidism   . Abnormal LFTs   . Gallbladder polyp   . BPH (benign prostatic hyperplasia)   . Actinic keratosis   . Lung nodule     benign BX 03/24/2008  . Anemia   . H/O asbestos exposure   . Pneumonia     hx of walking PNA  . Shortness of breath     with exertion  . Constipation     from medications  . Arthritis   . Kidney stone August 2014    hx of  . Birth mark     left arm and shoulder; Dark purple on upper L chest ;NOT A RASH    ALLERGIES:  is allergic to codeine.  MEDICATIONS:  Current Outpatient Prescriptions  Medication Sig Dispense Refill  . ALPRAZolam (XANAX) 1 MG tablet Take 1 mg by mouth daily as needed for anxiety.   0  . Alum & Mag Hydroxide-Simeth (MAGIC MOUTHWASH W/LIDOCAINE) SOLN Take 5 mLs by mouth 4 (four) times daily. SWISH AND SPIT 240 mL 1  . amLODipine (NORVASC) 10 MG tablet Take 10 mg by mouth at bedtime.     Marland Kitchen aspirin EC 81 MG tablet Take 81 mg by mouth daily.    . benzonatate (TESSALON) 200 MG capsule Take 1 capsule (200 mg total) by mouth 2 (two) times daily as  needed for cough. (Patient not taking: Reported on 09/18/2014) 40 capsule 0  . dronabinol (MARINOL) 2.5 MG capsule Take 1 capsule (2.5 mg total) by mouth 2 (two) times daily before lunch and supper. (Patient not taking: Reported on 09/18/2014) 60 capsule 0  . Eszopiclone (ESZOPICLONE) 3 MG TABS Take 3 mg by mouth at bedtime.     . fluconazole (DIFLUCAN) 100 MG tablet Take 1 tablet (100 mg total) by mouth daily. 10 tablet 0  . folic acid (FOLVITE) 1 MG tablet Take 1 tablet (1 mg total) by mouth daily. 30 tablet 3  . levothyroxine  (SYNTHROID, LEVOTHROID) 88 MCG tablet Take 88 mcg by mouth daily before breakfast.    . prochlorperazine (COMPAZINE) 10 MG tablet Take 1 tablet (10 mg total) by mouth every 6 (six) hours as needed for nausea or vomiting. 30 tablet 0  . tamsulosin (FLOMAX) 0.4 MG CAPS Take 0.4 mg by mouth at bedtime.     . temazepam (RESTORIL) 15 MG capsule Take 15 mg by mouth at bedtime.   0  . traMADol (ULTRAM) 50 MG tablet Take 1 tablet (50 mg total) by mouth every 6 (six) hours as needed. 30 tablet 0   No current facility-administered medications for this visit.    SURGICAL HISTORY:  Past Surgical History  Procedure Laterality Date  . Back surgery      Dr Fannie Knee 1978 and 1982  . Cervical disc surgery    . Right foot surgery      plantar fascitis  . Right shoulder surgery      torn rotator cuff  . Right ankle fracture         . Right wrist fracture    . Foot surgery Left   . Tonsillectomy    . Appendectomy    . Colonoscopy    . Flexible bronchoscopy N/A 12/07/2012    Procedure: FLEXIBLE BRONCHOSCOPY;  Surgeon: [name], MD;  Location: [facility] OR;  Service: Thoracic;  Laterality: N/A;  . Video assisted thoracoscopy (vats)/thorocotomy Left 12/07/2012    Procedure: VIDEO ASSISTED THORACOSCOPY (VATS)/ EXPLORATORY THOROCOTOMY;  Surgeon: [name], MD;  Location: [facility] OR;  Service: Thoracic;  Laterality: Left;    REVIEW OF SYSTEMS:  Constitutional: positive for fatigue Eyes: negative Ears, nose, mouth, throat, and face: positive for hoarseness and sore mouth Respiratory: positive for cough and dyspnea on exertion Cardiovascular: negative Gastrointestinal: negative Genitourinary:negative Integument/breast: negative Hematologic/lymphatic: negative Musculoskeletal:positive for arthralgias Neurological: negative Behavioral/Psych: negative Endocrine: negative Allergic/Immunologic: negative   PHYSICAL EXAMINATION: General appearance: alert, cooperative, appears stated age and no distress Head:  Normocephalic, without obvious abnormality, atraumatic Neck: no adenopathy, no JVD, supple, symmetrical, trachea midline and thyroid not enlarged, symmetric, no tenderness/mass/nodules Lymph nodes: Cervical, supraclavicular, and axillary nodes normal. Resp: diminished breath sounds LUL and dullness to percussion LUL Cardio: regular rate and rhythm, S1, S2 normal, no murmur, click, rub or gallop GI: soft, non-tender; bowel sounds normal; no masses,  no organomegaly Extremities: extremities normal, atraumatic, no cyanosis or edema Neurologic: Alert and oriented X 3, normal strength and tone. Normal symmetric reflexes. Normal coordination and gait  ECOG PERFORMANCE STATUS: 1 - Symptomatic but completely ambulatory  Blood pressure 113/58, pulse 74, temperature 97.3 F (36.3 C), temperature source Oral, resp. rate 18, height 5\' 10"  (1.778 m), weight 150 lb 1.6 oz (68.085 kg), SpO2 98 %.  LABORATORY DATA: Lab Results  Component Value Date   WBC 7.0 09/22/2014   HGB 10.0* 09/22/2014   HCT 31.2* 09/22/2014  MCV 91.8 09/22/2014   PLT 181 09/22/2014      Chemistry      Component Value Date/Time   NA 140 09/22/2014 1230   NA 141 09/09/2014 1312   K 3.7 09/22/2014 1230   K 4.2 09/09/2014 1312   CL 108 09/22/2014 1230   CO2 26 09/22/2014 1230   CO2 24 09/09/2014 1312   BUN 14 09/22/2014 1230   BUN 17.1 09/09/2014 1312   CREATININE 0.82 09/22/2014 1230   CREATININE 0.9 09/09/2014 1312      Component Value Date/Time   CALCIUM 8.4* 09/22/2014 1230   CALCIUM 8.5 09/09/2014 1312   ALKPHOS 217* 09/09/2014 1312   ALKPHOS 129* 12/05/2012 1348   AST 42* 09/09/2014 1312   AST 44* 12/05/2012 1348   ALT 25 09/09/2014 1312   ALT 35 12/05/2012 1348   BILITOT 0.53 09/09/2014 1312   BILITOT 0.5 12/05/2012 1348       RADIOGRAPHIC STUDIES: Ct Chest W Contrast  09/05/2014   CLINICAL DATA:  Subsequent encounter for lung cancer  EXAM: CT CHEST, ABDOMEN, AND PELVIS WITH CONTRAST  TECHNIQUE:  Multidetector CT imaging of the chest, abdomen and pelvis was performed following the standard protocol during bolus administration of intravenous contrast.  CONTRAST:  165mL OMNIPAQUE IOHEXOL 300 MG/ML  SOLN  COMPARISON:  None.  FINDINGS: CT CHEST FINDINGS  Mediastinum/Nodes: There is no axillary lymphadenopathy. 13 mm short axis left supraclavicular lymphadenopathy is stable. 12 mm AP window lymph node is unchanged. No right hilar lymphadenopathy. The hila confluent soft tissue attenuation in the left upper hemi thorax measures 6.8 x 3.1 cm today compared to 6.1 x 3.0 cm previously. Reproducible measurement of this area is difficult, no substantial interval change is evident. The abnormal soft tissue attenuation in the left hilum has become more confluent in the interval, but this may be secondary to progressive volume loss of the remaining left lung rather than true soft tissue expansion close continued attention will be required. Left pleural thickening is stable.  Lungs/Pleura: 10 mm subpleural nodule in the right apex (image 12 series 4) is new in the interval. Her are calcified pleural plaque also seen on the right. Collapse/consolidation the left lung has progressed in the interval.  Musculoskeletal: Bone windows reveal no worrisome lytic or sclerotic osseous lesions.  CT ABDOMEN AND PELVIS FINDINGS  Hepatobiliary: Left hepatic lobe appears markedly atrophic. Liver contour nodular. Gallbladder is decompressed. No intrahepatic or extrahepatic biliary dilation.  Pancreas: No focal mass lesion. No dilatation of the main duct. No intraparenchymal cyst. No peripancreatic edema.  Spleen: No splenomegaly. No focal mass lesion.  Adrenals/Urinary Tract: 12 x 21 mm right adrenal nodule appears new in the interval. 14 mm left adrenal nodule shows no substantial change. Cystic lesions in the right kidney are stable. Left kidney shows no enhancing abnormality. There is no hydroureter. Urinary bladder is nondistended.   Stomach/Bowel: Stomach is unremarkable. Duodenum is normally positioned as is the ligament of Treitz. No small bowel wall thickening. No small bowel dilatation. Terminal ileum is normal. The appendix is not visualized, but there is no edema or inflammation in the region of the cecum. Diverticular changes are noted in the left colon without evidence of diverticulitis.  Vascular/Lymphatic: There is abdominal aortic atherosclerosis without aneurysm. Penetrating ulcer versus ulcerated plaque noted in the abdominal aorta. No gastrohepatic or hepatoduodenal ligament lymphadenopathy. No retroperitoneal lymphadenopathy. No evidence for pelvic sidewall lymphadenopathy.  Reproductive: Prostate gland is markedly enlarged. Seminal vesicles are unremarkable.  Other: No  intraperitoneal free fluid.  Musculoskeletal: Bone windows reveal no worrisome lytic or sclerotic osseous lesions.  IMPRESSION: 1. Increasing collapse/consolidation in the left lung resulting in an interval increase in confluent soft tissue attenuation in the left parahilar region. As disease progression cannot be excluded, close continued attention recommended. 2. Liver parenchyma has morphologic features suggesting cirrhosis. Marked atrophy of the left hepatic lobe noted. 3. New small right adrenal nodule. Continued close attention recommended as metastatic disease is a concern. Left adrenal nodule is stable. 4. Tiny calcified gallstone seen previously is not evident on today's exam. 5. Marked prostatomegaly.   Electronically Signed   By: Misty Stanley M.D.   On: 09/05/2014 16:56   Ct Abdomen Pelvis W Contrast  09/05/2014   CLINICAL DATA:  Subsequent encounter for lung cancer  EXAM: CT CHEST, ABDOMEN, AND PELVIS WITH CONTRAST  TECHNIQUE: Multidetector CT imaging of the chest, abdomen and pelvis was performed following the standard protocol during bolus administration of intravenous contrast.  CONTRAST:  160mL OMNIPAQUE IOHEXOL 300 MG/ML  SOLN  COMPARISON:   None.  FINDINGS: CT CHEST FINDINGS  Mediastinum/Nodes: There is no axillary lymphadenopathy. 13 mm short axis left supraclavicular lymphadenopathy is stable. 12 mm AP window lymph node is unchanged. No right hilar lymphadenopathy. The hila confluent soft tissue attenuation in the left upper hemi thorax measures 6.8 x 3.1 cm today compared to 6.1 x 3.0 cm previously. Reproducible measurement of this area is difficult, no substantial interval change is evident. The abnormal soft tissue attenuation in the left hilum has become more confluent in the interval, but this may be secondary to progressive volume loss of the remaining left lung rather than true soft tissue expansion close continued attention will be required. Left pleural thickening is stable.  Lungs/Pleura: 10 mm subpleural nodule in the right apex (image 12 series 4) is new in the interval. Her are calcified pleural plaque also seen on the right. Collapse/consolidation the left lung has progressed in the interval.  Musculoskeletal: Bone windows reveal no worrisome lytic or sclerotic osseous lesions.  CT ABDOMEN AND PELVIS FINDINGS  Hepatobiliary: Left hepatic lobe appears markedly atrophic. Liver contour nodular. Gallbladder is decompressed. No intrahepatic or extrahepatic biliary dilation.  Pancreas: No focal mass lesion. No dilatation of the main duct. No intraparenchymal cyst. No peripancreatic edema.  Spleen: No splenomegaly. No focal mass lesion.  Adrenals/Urinary Tract: 12 x 21 mm right adrenal nodule appears new in the interval. 14 mm left adrenal nodule shows no substantial change. Cystic lesions in the right kidney are stable. Left kidney shows no enhancing abnormality. There is no hydroureter. Urinary bladder is nondistended.  Stomach/Bowel: Stomach is unremarkable. Duodenum is normally positioned as is the ligament of Treitz. No small bowel wall thickening. No small bowel dilatation. Terminal ileum is normal. The appendix is not visualized, but  there is no edema or inflammation in the region of the cecum. Diverticular changes are noted in the left colon without evidence of diverticulitis.  Vascular/Lymphatic: There is abdominal aortic atherosclerosis without aneurysm. Penetrating ulcer versus ulcerated plaque noted in the abdominal aorta. No gastrohepatic or hepatoduodenal ligament lymphadenopathy. No retroperitoneal lymphadenopathy. No evidence for pelvic sidewall lymphadenopathy.  Reproductive: Prostate gland is markedly enlarged. Seminal vesicles are unremarkable.  Other: No intraperitoneal free fluid.  Musculoskeletal: Bone windows reveal no worrisome lytic or sclerotic osseous lesions.  IMPRESSION: 1. Increasing collapse/consolidation in the left lung resulting in an interval increase in confluent soft tissue attenuation in the left parahilar region. As disease progression cannot be  excluded, close continued attention recommended. 2. Liver parenchyma has morphologic features suggesting cirrhosis. Marked atrophy of the left hepatic lobe noted. 3. New small right adrenal nodule. Continued close attention recommended as metastatic disease is a concern. Left adrenal nodule is stable. 4. Tiny calcified gallstone seen previously is not evident on today's exam. 5. Marked prostatomegaly.   Electronically Signed   By: Misty Stanley M.D.   On: 09/05/2014 16:56   Ir Fluoro Guide Cv Line Right  09/22/2014   CLINICAL DATA:  Lung carcinoma. Difficult venous access. Long-term venous access needs are anticipated.  EXAM: TUNNELED PORT CATHETER PLACEMENT WITH ULTRASOUND AND FLUOROSCOPIC GUIDANCE  FLUOROSCOPY TIME:  24 seconds, 4 mGy  ANESTHESIA/SEDATION: Intravenous Fentanyl and Versed were administered as conscious sedation during continuous cardiorespiratory monitoring by the radiology RN, with a total moderate sedation time of 18 minutes.  TECHNIQUE: The procedure, risks, benefits, and alternatives were explained to the patient. Questions regarding the procedure  were encouraged and answered. The patient understands and consents to the procedure. As antibiotic prophylaxis, cefazolin 2 g was ordered pre-procedure and administered intravenously within one hour of incision. Patency of the right IJ vein was confirmed with ultrasound with image documentation. An appropriate skin site was determined. Skin site was marked. Region was prepped using maximum barrier technique including cap and mask, sterile gown, sterile gloves, large sterile sheet, and Chlorhexidine as cutaneous antisepsis. The region was infiltrated locally with 1% lidocaine. Under real-time ultrasound guidance, the right IJ vein was accessed with a 21 gauge micropuncture needle; the needle tip within the vein was confirmed with ultrasound image documentation. Needle was exchanged over a 018 guidewire for transitional dilator which allowed passage of the [facility] wire into the IVC. Over this, the transitional dilator was exchanged for a 5 [place] MPA catheter. A small incision was made on the right anterior chest wall and a subcutaneous pocket fashioned. The power-injectable port was positioned and its catheter tunneled to the right IJ dermatotomy site. The MPA catheter was exchanged over an Amplatz wire for a peel-away sheath, through which the port catheter, which had been trimmed to the appropriate length, was advanced and positioned under fluoroscopy with its tip at the cavoatrial junction. Spot chest radiograph confirms good catheter position and no pneumothorax. The pocket was closed with deep interrupted and subcuticular continuous 3-0 Monocryl sutures. The port was flushed per protocol. The incisions were covered with Dermabond then covered with a sterile dressing.  COMPLICATIONS: COMPLICATIONS None immediate  IMPRESSION: Technically successful right IJ power-injectable port catheter placement. Ready for routine use.   Electronically Signed   By: Lucrezia Europe M.D.   On: 09/22/2014 15:21   Ir US Guide Vasc  Access Right  09/22/2014   CLINICAL DATA:  Lung carcinoma. Difficult venous access. Long-term venous access needs are anticipated.  EXAM: TUNNELED PORT CATHETER PLACEMENT WITH ULTRASOUND AND FLUOROSCOPIC GUIDANCE  FLUOROSCOPY TIME:  24 seconds, 4 mGy  ANESTHESIA/SEDATION: Intravenous Fentanyl and Versed were administered as conscious sedation during continuous cardiorespiratory monitoring by the radiology RN, with a total moderate sedation time of 18 minutes.  TECHNIQUE: The procedure, risks, benefits, and alternatives were explained to the patient. Questions regarding the procedure were encouraged and answered. The patient understands and consents to the procedure. As antibiotic prophylaxis, cefazolin 2 g was ordered pre-procedure and administered intravenously within one hour of incision. Patency of the right IJ vein was confirmed with ultrasound with image documentation. An appropriate skin site was determined. Skin site was marked. Region was prepped  using maximum barrier technique including cap and mask, sterile gown, sterile gloves, large sterile sheet, and Chlorhexidine as cutaneous antisepsis. The region was infiltrated locally with 1% lidocaine. Under real-time ultrasound guidance, the right IJ vein was accessed with a 21 gauge micropuncture needle; the needle tip within the vein was confirmed with ultrasound image documentation. Needle was exchanged over a 018 guidewire for transitional dilator which allowed passage of the [facility] wire into the IVC. Over this, the transitional dilator was exchanged for a 5 [place] MPA catheter. A small incision was made on the right anterior chest wall and a subcutaneous pocket fashioned. The power-injectable port was positioned and its catheter tunneled to the right IJ dermatotomy site. The MPA catheter was exchanged over an Amplatz wire for a peel-away sheath, through which the port catheter, which had been trimmed to the appropriate length, was advanced and positioned  under fluoroscopy with its tip at the cavoatrial junction. Spot chest radiograph confirms good catheter position and no pneumothorax. The pocket was closed with deep interrupted and subcuticular continuous 3-0 Monocryl sutures. The port was flushed per protocol. The incisions were covered with Dermabond then covered with a sterile dressing.  COMPLICATIONS: COMPLICATIONS None immediate  IMPRESSION: Technically successful right IJ power-injectable port catheter placement. Ready for routine use.   Electronically Signed   By: Lucrezia Europe M.D.   On: 09/22/2014 15:21      ASSESSMENT/PLAN: This is a very pleasant 81 year old white male recently diagnosed with a stage IIIa non-small cell lung cancer currently undergoing concurrent chemoradiation with weekly carboplatin and paclitaxel status post 7 cycles.  He then received treatment with systemic chemotherapy consisting of carboplatin and Alimta status post 6 cycles for recurrent disease. The last CT scan of the chest, abdomen and pelvis showed no evidence for disease progression.   He is currently being treated with  immunotherapy with Nivolumab 3 MG/KG every 2 weeks. He is now status post 5 cycles and is tolerating this treatment without difficulty. The patient was discussed with and also seen by Dr. Julien Nordmann. He will proceed with cycle #6 today as scheduled.    He will follow-up in 2 weeks prior to cycle #7 of his immunotherapy with Nivolumab.   The patient was advised to call immediately if he has any concerning symptoms in the interval. All questions were answered. The patient knows to call the clinic with any problems, questions or concerns. We can certainly see the patient much sooner if necessary.   Mikey Bussing, NP 09/23/2014  ADDENDUM: Hematology/Oncology Attending: I had a face to face encounter with the patient. I recommended his care plan. This is a very pleasant 81 years old white male with recurrent non-small cell lung cancer,  adenocarcinoma who is currently undergoing treatment with immunotherapy with Nivolumab status post 5 cycles. The patient is tolerating the treatment fairly well now with weight gain. He continues to have shortness breath with exertion as well as hoarseness of his voice. I recommended for the patient to proceed with cycle #6 today as a scheduled. He would come back for follow-up visit in 2 weeks for reevaluation before starting cycle #7. He was advised to call immediately if he has any concerning symptoms in the interval.  Disclaimer: This note was dictated with voice recognition software. Similar sounding words can inadvertently be transcribed and may not be corrected upon review.  Eilleen Kempf., MD 09/24/2014

## 2014-09-23 NOTE — Patient Instructions (Signed)
[facility] Discharge Instructions for Patients Receiving Chemotherapy  Today you received the following chemotherapy agents Nivolumab  To help prevent nausea and vomiting after your treatment, we encourage you to take your nausea medication     If you develop nausea and vomiting that is not controlled by your nausea medication, call the clinic.   BELOW ARE SYMPTOMS THAT SHOULD BE REPORTED IMMEDIATELY:  *FEVER GREATER THAN 100.5 F  *CHILLS WITH OR WITHOUT FEVER  NAUSEA AND VOMITING THAT IS NOT CONTROLLED WITH YOUR NAUSEA MEDICATION  *UNUSUAL SHORTNESS OF BREATH  *UNUSUAL BRUISING OR BLEEDING  TENDERNESS IN MOUTH AND THROAT WITH OR WITHOUT PRESENCE OF ULCERS  *URINARY PROBLEMS  *BOWEL PROBLEMS  UNUSUAL RASH Items with * indicate a potential emergency and should be followed up as soon as possible.  Feel free to call the clinic you have any questions or concerns. The clinic phone number is [phone].  Please show the CHEMO ALERT CARD at check-in to the Emergency Department and triage nurse.   

## 2014-09-23 NOTE — Telephone Encounter (Signed)
Pt confirmed labs/ov per 06/14 POF, gave pt AVS and Calendar.... KJ, sent msg to move chemo down

## 2014-10-07 ENCOUNTER — Ambulatory Visit (HOSPITAL_BASED_OUTPATIENT_CLINIC_OR_DEPARTMENT_OTHER): Payer: Medicare Other | Admitting: Physician Assistant

## 2014-10-07 ENCOUNTER — Telehealth: Payer: Self-pay | Admitting: *Deleted

## 2014-10-07 ENCOUNTER — Telehealth: Payer: Self-pay | Admitting: Physician Assistant

## 2014-10-07 ENCOUNTER — Ambulatory Visit: Payer: Medicare Other

## 2014-10-07 ENCOUNTER — Other Ambulatory Visit (HOSPITAL_BASED_OUTPATIENT_CLINIC_OR_DEPARTMENT_OTHER): Payer: Medicare Other

## 2014-10-07 ENCOUNTER — Ambulatory Visit (HOSPITAL_BASED_OUTPATIENT_CLINIC_OR_DEPARTMENT_OTHER): Payer: Medicare Other

## 2014-10-07 ENCOUNTER — Encounter: Payer: Self-pay | Admitting: Physician Assistant

## 2014-10-07 VITALS — BP 110/37 | HR 75 | Temp 98.7°F | Resp 18 | Ht 70.0 in | Wt 152.4 lb

## 2014-10-07 DIAGNOSIS — R5382 Chronic fatigue, unspecified: Secondary | ICD-10-CM

## 2014-10-07 DIAGNOSIS — C3412 Malignant neoplasm of upper lobe, left bronchus or lung: Secondary | ICD-10-CM

## 2014-10-07 DIAGNOSIS — Z95828 Presence of other vascular implants and grafts: Secondary | ICD-10-CM

## 2014-10-07 DIAGNOSIS — Z5112 Encounter for antineoplastic immunotherapy: Secondary | ICD-10-CM

## 2014-10-07 DIAGNOSIS — Z79899 Other long term (current) drug therapy: Secondary | ICD-10-CM | POA: Diagnosis not present

## 2014-10-07 DIAGNOSIS — C3492 Malignant neoplasm of unspecified part of left bronchus or lung: Secondary | ICD-10-CM

## 2014-10-07 LAB — COMPREHENSIVE METABOLIC PANEL (CC13)
ALBUMIN: 2.4 g/dL — AB (ref 3.5–5.0)
ALT: 30 U/L (ref 0–55)
AST: 50 U/L — ABNORMAL HIGH (ref 5–34)
Alkaline Phosphatase: 249 U/L — ABNORMAL HIGH (ref 40–150)
Anion Gap: 6 mEq/L (ref 3–11)
BUN: 12.7 mg/dL (ref 7.0–26.0)
CO2: 28 meq/L (ref 22–29)
CREATININE: 0.8 mg/dL (ref 0.7–1.3)
Calcium: 8.5 mg/dL (ref 8.4–10.4)
Chloride: 104 mEq/L (ref 98–109)
EGFR: 84 mL/min/{1.73_m2} — AB (ref >90)
Glucose: 103 mg/dl (ref 70–140)
Potassium: 4 mEq/L (ref 3.5–5.1)
Sodium: 137 mEq/L (ref 136–145)
Total Bilirubin: 0.77 mg/dL (ref 0.20–1.20)
Total Protein: 6.2 g/dL — ABNORMAL LOW (ref 6.4–8.3)

## 2014-10-07 LAB — CBC WITH DIFFERENTIAL/PLATELET
BASO%: 0.8 % (ref 0.0–2.0)
BASOS ABS: 0.1 10*3/uL (ref 0.0–0.1)
EOS%: 1.2 % (ref 0.0–7.0)
Eosinophils Absolute: 0.1 10*3/uL (ref 0.0–0.5)
HEMATOCRIT: 29.3 % — AB (ref 38.4–49.9)
HEMOGLOBIN: 9.5 g/dL — AB (ref 13.0–17.1)
LYMPH#: 0.7 10*3/uL — AB (ref 0.9–3.3)
LYMPH%: 8.5 % — ABNORMAL LOW (ref 14.0–49.0)
MCH: 29.5 pg (ref 27.2–33.4)
MCHC: 32.6 g/dL (ref 32.0–36.0)
MCV: 90.5 fL (ref 79.3–98.0)
MONO#: 0.6 10*3/uL (ref 0.1–0.9)
MONO%: 7.4 % (ref 0.0–14.0)
NEUT%: 82.1 % — AB (ref 39.0–75.0)
NEUTROS ABS: 6.3 10*3/uL (ref 1.5–6.5)
Platelets: 207 10*3/uL (ref 140–400)
RBC: 3.24 10*6/uL — ABNORMAL LOW (ref 4.20–5.82)
RDW: 16.4 % — ABNORMAL HIGH (ref 11.0–14.6)
WBC: 7.7 10*3/uL (ref 4.0–10.3)

## 2014-10-07 LAB — TSH CHCC: TSH: 5.115 m(IU)/L — ABNORMAL HIGH (ref 0.320–4.118)

## 2014-10-07 MED ORDER — SODIUM CHLORIDE 0.9 % IV SOLN
2.9000 mg/kg | Freq: Once | INTRAVENOUS | Status: AC
  Administered 2014-10-07: 200 mg via INTRAVENOUS
  Filled 2014-10-07: qty 20

## 2014-10-07 MED ORDER — SODIUM CHLORIDE 0.9 % IV SOLN
Freq: Once | INTRAVENOUS | Status: AC
  Administered 2014-10-07: 15:00:00 via INTRAVENOUS

## 2014-10-07 MED ORDER — HEPARIN SOD (PORK) LOCK FLUSH 100 UNIT/ML IV SOLN
500.0000 [IU] | Freq: Once | INTRAVENOUS | Status: AC | PRN
  Administered 2014-10-07: 500 [IU]
  Filled 2014-10-07: qty 5

## 2014-10-07 MED ORDER — SODIUM CHLORIDE 0.9 % IJ SOLN
10.0000 mL | INTRAMUSCULAR | Status: DC | PRN
  Administered 2014-10-07: 10 mL via INTRAVENOUS
  Filled 2014-10-07: qty 10

## 2014-10-07 MED ORDER — SODIUM CHLORIDE 0.9 % IJ SOLN
10.0000 mL | INTRAMUSCULAR | Status: DC | PRN
  Administered 2014-10-07: 10 mL
  Filled 2014-10-07: qty 10

## 2014-10-07 MED ORDER — TRAMADOL HCL 50 MG PO TABS: 50.0000 mg | ORAL_TABLET | Freq: Four times a day (QID) | ORAL | Status: AC | PRN

## 2014-10-07 MED ORDER — ALBUTEROL SULFATE HFA 108 (90 BASE) MCG/ACT IN AERS: 2.0000 | INHALATION_SPRAY | Freq: Four times a day (QID) | RESPIRATORY_TRACT | Status: AC | PRN

## 2014-10-07 NOTE — Telephone Encounter (Signed)
Per staff message and POF I have scheduled appts. Advised scheduler of appts. JMW  

## 2014-10-07 NOTE — Patient Instructions (Signed)

## 2014-10-07 NOTE — Telephone Encounter (Signed)
pdr pof to sch pt appt-sent MW emailt o sch pt trmt-gave pt copy of avs

## 2014-10-07 NOTE — Progress Notes (Signed)
TSH elevated at 5.115; Dr. Julien Nordmann notified. To send results to PCP who manages synthroid medication. OK to treat with Nivolumab per MD.

## 2014-10-07 NOTE — Progress Notes (Addendum)
[facility]  [phone] [phone]  OFFICE VISIT PROGRESS NOTE  Christopher Roach, [place] [address]  DIAGNOSIS: Recurrent non-small cell lung cancer initially diagnosed as Stage IIIA (T1b, N2, M0) non-small cell lung cancer, adenocarcinoma with negative EGFR mutation and negative ALK gene translocation  PRIOR THERAPY:  1) Concurrent chemoradiation with weekly carboplatin for an AUC of 2 and paclitaxel at 45 mg per meter squared concurrent with radiation therapy under the care of Dr. Lisbeth Roach. He status post 7 cycles of chemotherapy. Last dose was given 02/11/2013 with partial response. 2) Systemic chemotherapy with carboplatin for AUC of 5 and Alimta 500 mg/M2 every 3 weeks. First dose on 01/07/2014. Status post 6 cycles. Last dose was given 04/22/2014 with stable disease.  CURRENT THERAPY: Immunotherapy with Nivolumab 3 MG/M2 every 2 weeks. First dose given on 07/01/2014. Status post 6 cycles  DISEASE STAGE: Metastatic non-small cell lung cancer   CHEMOTHERAPY INTENT: Palliative.  CURRENT # OF CHEMOTHERAPY CYCLES: 7  CURRENT ANTIEMETICS: Zofran, dexamethasone, and Compazine for at home use  CURRENT SMOKING STATUS: Former smoker quit 12/20/1992  ORAL CHEMOTHERAPY AND CONSENT: n/a  CURRENT BISPHOSPHONATES USE: None  PAIN MANAGEMENT: Tramadol  NARCOTICS INDUCED CONSTIPATION: None  LIVING WILL AND CODE STATUS: ?  INTERVAL HISTORY: Christopher Roach 81 y.o. male returns for followup visit accompanied by several  family members.  He has tolerated his treatment with immunotherapy without significant adverse events.  He had no difficulty with his breathing, no skin rash or diarrhea. He reports that his primary care physician, Dr. Jani Roach, recently decreased his Synthroid to 88 g daily. He's been on this dose for approximately 3 weeks. He requests a refill for his tramadol which manages his pain quite well.  He continues  to have hoarseness in his voice that predated his treatment with immunotherapy.  The patient denied having any nausea or vomiting. He denied having any fever or chills. His appetite is slowly improving and he is gaining weight. Overall he feels well with a slight increase in his energy level.   MEDICAL HISTORY: Past Medical History  Diagnosis Date  . Hypertension   . Hyperlipidemia   . Hypothyroidism   . Abnormal LFTs   . Gallbladder polyp   . BPH (benign prostatic hyperplasia)   . Actinic keratosis   . Lung nodule     benign BX 03/24/2008  . Anemia   . H/O asbestos exposure   . Pneumonia     hx of walking PNA  . Shortness of breath     with exertion  . Constipation     from medications  . Arthritis   . Kidney stone August 2014    hx of  . Birth mark     left arm and shoulder; Dark purple on upper L chest ;NOT A RASH    ALLERGIES:  is allergic to codeine.  MEDICATIONS:  Current Outpatient Prescriptions  Medication Sig Dispense Refill  . ALPRAZolam (XANAX) 1 MG tablet Take 1 mg by mouth daily as needed for anxiety.   0  . Alum & Mag Hydroxide-Simeth (MAGIC MOUTHWASH W/LIDOCAINE) SOLN Take 5 mLs by mouth 4 (four) times daily. SWISH AND SPIT 240 mL 1  . amLODipine (NORVASC) 10 MG tablet Take 10 mg by mouth at bedtime.     Marland Kitchen aspirin EC 81 MG tablet Take 81 mg by mouth daily.    . benzonatate (TESSALON) 200 MG capsule Take 1 capsule (200 mg  total) by mouth 2 (two) times daily as needed for cough. 40 capsule 0  . Eszopiclone (ESZOPICLONE) 3 MG TABS Take 3 mg by mouth at bedtime.     . folic acid (FOLVITE) 1 MG tablet Take 1 tablet (1 mg total) by mouth daily. 30 tablet 3  . levothyroxine (SYNTHROID, LEVOTHROID) 88 MCG tablet Take 88 mcg by mouth daily before breakfast.    . lidocaine-prilocaine (EMLA) cream Apply 1 application topically as needed. 30 g 0  . prochlorperazine (COMPAZINE) 10 MG tablet Take 1 tablet (10 mg total) by mouth every 6 (six) hours as needed for nausea or  vomiting. 30 tablet 0  . tamsulosin (FLOMAX) 0.4 MG CAPS Take 0.4 mg by mouth at bedtime.     . temazepam (RESTORIL) 15 MG capsule Take 15 mg by mouth at bedtime.   0  . traMADol (ULTRAM) 50 MG tablet Take 1 tablet (50 mg total) by mouth every 6 (six) hours as needed. 30 tablet 0  . albuterol (PROVENTIL HFA;VENTOLIN HFA) 108 (90 BASE) MCG/ACT inhaler Inhale 2 puffs into the lungs every 6 (six) hours as needed for wheezing or shortness of breath. 1 Inhaler 2   No current facility-administered medications for this visit.   Facility-Administered Medications Ordered in Other Visits  Medication Dose Route Frequency Provider Last Rate Last Dose  . 0.9 %  sodium chloride infusion   Intravenous Once Curt Bears, MD      . heparin lock flush 100 unit/mL  500 Units Intracatheter Once PRN Curt Bears, MD      . sodium chloride 0.9 % injection 10 mL  10 mL Intracatheter PRN Curt Bears, MD        SURGICAL HISTORY:  Past Surgical History  Procedure Laterality Date  . Back surgery      Dr Collie Siad 1978 and 1982  . Cervical disc surgery    . Right foot surgery      plantar fascitis  . Right shoulder surgery      torn rotator cuff  . Right ankle fracture         . Right wrist fracture    . Foot surgery Left   . Tonsillectomy    . Appendectomy    . Colonoscopy    . Flexible bronchoscopy N/A 12/07/2012    Procedure: FLEXIBLE BRONCHOSCOPY;  Surgeon: Gaye Pollack, MD;  Location: [facility] OR;  Service: Thoracic;  Laterality: N/A;  . Video assisted thoracoscopy (vats)/thorocotomy Left 12/07/2012    Procedure: VIDEO ASSISTED THORACOSCOPY (VATS)/ EXPLORATORY THOROCOTOMY;  Surgeon: Gaye Pollack, MD;  Location: [facility] OR;  Service: Thoracic;  Laterality: Left;    REVIEW OF SYSTEMS:  Constitutional: positive for fatigue Eyes: negative Ears, nose, mouth, throat, and face: positive for hoarseness and sore mouth Respiratory: positive for cough and dyspnea on exertion Cardiovascular:  negative Gastrointestinal: negative Genitourinary:negative Integument/breast: negative Hematologic/lymphatic: negative Musculoskeletal:positive for arthralgias Neurological: negative Behavioral/Psych: negative Endocrine: negative Allergic/Immunologic: negative   PHYSICAL EXAMINATION: General appearance: alert, cooperative, appears stated age and no distress Head: Normocephalic, without obvious abnormality, atraumatic Neck: no adenopathy, no JVD, supple, symmetrical, trachea midline and thyroid not enlarged, symmetric, no tenderness/mass/nodules Lymph nodes: Cervical, supraclavicular, and axillary nodes normal. Resp: diminished breath sounds LUL, dullness to percussion LUL and wheezes bilaterally and faint Cardio: regular rate and rhythm, S1, S2 normal, no murmur, click, rub or gallop GI: soft, non-tender; bowel sounds normal; no masses,  no organomegaly Extremities: extremities normal, atraumatic, no cyanosis or edema Neurologic: Alert and oriented X  3, normal strength and tone. Normal symmetric reflexes. Normal coordination and gait  ECOG PERFORMANCE STATUS: 1 - Symptomatic but completely ambulatory  Blood pressure 110/37, pulse 75, temperature 98.7 F (37.1 C), temperature source Oral, resp. rate 18, height $RemoveBe'5\' 10"'HXnwHutwr$  (1.778 m), weight 152 lb 6.4 oz (69.128 kg), SpO2 99 %.  LABORATORY DATA: Lab Results  Component Value Date   WBC 7.7 10/07/2014   HGB 9.5* 10/07/2014   HCT 29.3* 10/07/2014   MCV 90.5 10/07/2014   PLT 207 10/07/2014      Chemistry      Component Value Date/Time   NA 137 10/07/2014 1225   NA 140 09/22/2014 1230   K 4.0 10/07/2014 1225   K 3.7 09/22/2014 1230   CL 108 09/22/2014 1230   CO2 28 10/07/2014 1225   CO2 26 09/22/2014 1230   BUN 12.7 10/07/2014 1225   BUN 14 09/22/2014 1230   CREATININE 0.8 10/07/2014 1225   CREATININE 0.82 09/22/2014 1230      Component Value Date/Time   CALCIUM 8.5 10/07/2014 1225   CALCIUM 8.4* 09/22/2014 1230   ALKPHOS  249* 10/07/2014 1225   ALKPHOS 129* 12/05/2012 1348   AST 50* 10/07/2014 1225   AST 44* 12/05/2012 1348   ALT 30 10/07/2014 1225   ALT 35 12/05/2012 1348   BILITOT 0.77 10/07/2014 1225   BILITOT 0.5 12/05/2012 1348       RADIOGRAPHIC STUDIES: Ir Fluoro Guide Cv Line Right  09/22/2014   CLINICAL DATA:  Lung carcinoma. Difficult venous access. Long-term venous access needs are anticipated.  EXAM: TUNNELED PORT CATHETER PLACEMENT WITH ULTRASOUND AND FLUOROSCOPIC GUIDANCE  FLUOROSCOPY TIME:  24 seconds, 4 mGy  ANESTHESIA/SEDATION: Intravenous Fentanyl and Versed were administered as conscious sedation during continuous cardiorespiratory monitoring by the radiology RN, with a total moderate sedation time of 18 minutes.  TECHNIQUE: The procedure, risks, benefits, and alternatives were explained to the patient. Questions regarding the procedure were encouraged and answered. The patient understands and consents to the procedure. As antibiotic prophylaxis, cefazolin 2 g was ordered pre-procedure and administered intravenously within one hour of incision. Patency of the right IJ vein was confirmed with ultrasound with image documentation. An appropriate skin site was determined. Skin site was marked. Region was prepped using maximum barrier technique including cap and mask, sterile gown, sterile gloves, large sterile sheet, and Chlorhexidine as cutaneous antisepsis. The region was infiltrated locally with 1% lidocaine. Under real-time ultrasound guidance, the right IJ vein was accessed with a 21 gauge micropuncture needle; the needle tip within the vein was confirmed with ultrasound image documentation. Needle was exchanged over a 018 guidewire for transitional dilator which allowed passage of the [facility] wire into the IVC. Over this, the transitional dilator was exchanged for a 5 [place] MPA catheter. A small incision was made on the right anterior chest wall and a subcutaneous pocket fashioned. The  power-injectable port was positioned and its catheter tunneled to the right IJ dermatotomy site. The MPA catheter was exchanged over an Amplatz wire for a peel-away sheath, through which the port catheter, which had been trimmed to the appropriate length, was advanced and positioned under fluoroscopy with its tip at the cavoatrial junction. Spot chest radiograph confirms good catheter position and no pneumothorax. The pocket was closed with deep interrupted and subcuticular continuous 3-0 Monocryl sutures. The port was flushed per protocol. The incisions were covered with Dermabond then covered with a sterile dressing.  COMPLICATIONS: COMPLICATIONS None immediate  IMPRESSION: Technically successful right IJ  power-injectable port catheter placement. Ready for routine use.   Electronically Signed   By: Lucrezia Europe M.D.   On: 09/22/2014 15:21   Ir US Guide Vasc Access Right  09/22/2014   CLINICAL DATA:  Lung carcinoma. Difficult venous access. Long-term venous access needs are anticipated.  EXAM: TUNNELED PORT CATHETER PLACEMENT WITH ULTRASOUND AND FLUOROSCOPIC GUIDANCE  FLUOROSCOPY TIME:  24 seconds, 4 mGy  ANESTHESIA/SEDATION: Intravenous Fentanyl and Versed were administered as conscious sedation during continuous cardiorespiratory monitoring by the radiology RN, with a total moderate sedation time of 18 minutes.  TECHNIQUE: The procedure, risks, benefits, and alternatives were explained to the patient. Questions regarding the procedure were encouraged and answered. The patient understands and consents to the procedure. As antibiotic prophylaxis, cefazolin 2 g was ordered pre-procedure and administered intravenously within one hour of incision. Patency of the right IJ vein was confirmed with ultrasound with image documentation. An appropriate skin site was determined. Skin site was marked. Region was prepped using maximum barrier technique including cap and mask, sterile gown, sterile gloves, large sterile sheet,  and Chlorhexidine as cutaneous antisepsis. The region was infiltrated locally with 1% lidocaine. Under real-time ultrasound guidance, the right IJ vein was accessed with a 21 gauge micropuncture needle; the needle tip within the vein was confirmed with ultrasound image documentation. Needle was exchanged over a 018 guidewire for transitional dilator which allowed passage of the [facility] wire into the IVC. Over this, the transitional dilator was exchanged for a 5 [place] MPA catheter. A small incision was made on the right anterior chest wall and a subcutaneous pocket fashioned. The power-injectable port was positioned and its catheter tunneled to the right IJ dermatotomy site. The MPA catheter was exchanged over an Amplatz wire for a peel-away sheath, through which the port catheter, which had been trimmed to the appropriate length, was advanced and positioned under fluoroscopy with its tip at the cavoatrial junction. Spot chest radiograph confirms good catheter position and no pneumothorax. The pocket was closed with deep interrupted and subcuticular continuous 3-0 Monocryl sutures. The port was flushed per protocol. The incisions were covered with Dermabond then covered with a sterile dressing.  COMPLICATIONS: COMPLICATIONS None immediate  IMPRESSION: Technically successful right IJ power-injectable port catheter placement. Ready for routine use.   Electronically Signed   By: Lucrezia Europe M.D.   On: 09/22/2014 15:21      ASSESSMENT/PLAN: This is a very pleasant 81 year old white male recently diagnosed with a stage IIIa non-small cell lung cancer currently undergoing concurrent chemoradiation with weekly carboplatin and paclitaxel status post 7 cycles.  He then received treatment with systemic chemotherapy consisting of carboplatin and Alimta status post 6 cycles for recurrent disease. The last CT scan of the chest, abdomen and pelvis showed no evidence for disease progression.   He is currently being treated  with  immunotherapy with Nivolumab 3 MG/KG every 2 weeks. He is now status post 6 cycles and is tolerating this treatment without difficulty. The patient was discussed with and also seen by Dr. Julien Nordmann. He will proceed with cycle #7 today as scheduled. His prescription for tramadol was refilled. For the wheezing prescription for an albuterol inhaler was sent to his pharmacy of record via E scribe. Dr. Maudie Mercury is managing his Synthroid dosing.   He will follow-up in 2 weeks prior to cycle #8 of his immunotherapy with Nivolumab.   The patient was advised to call immediately if he has any concerning symptoms in the interval. All questions  were answered. The patient knows to call the clinic with any problems, questions or concerns. We can certainly see the patient much sooner if necessary.   Carlton Adam, PA-C 10/07/2014  ADDENDUM: Hematology/Oncology Attending: I had a face to face encounter with the patient. I recommended his care plan.this is a very pleasant 81 years old white male with recurrent non-small cell lung cancer, adenocarcinoma was currently undergoing treatment with immunotherapy with Nivolumab status post 6 cycles.The patient history rating his treatment fairly well with no significant adverse effects. I recommended for him to proceed with cycle number7 today as a scheduled. He would come back for follow-up visit in 2 weeks for reevaluation before starting cycle #8. The patient was advised to call immediately if he has any concerning symptoms in the interval.  Disclaimer: This note was dictated with voice recognition software. Similar sounding words can inadvertently be transcribed and may be missed upon review. Eilleen Kempf., MD 10/11/2014

## 2014-10-07 NOTE — Patient Instructions (Signed)
[facility] Discharge Instructions for Patients  Today you received the following: Nivolumab   To help prevent nausea and vomiting after your treatment, we encourage you to take your nausea medication as directed.    If you develop nausea and vomiting that is not controlled by your nausea medication, call the clinic.   BELOW ARE SYMPTOMS THAT SHOULD BE REPORTED IMMEDIATELY:  *FEVER GREATER THAN 100.5 F  *CHILLS WITH OR WITHOUT FEVER  NAUSEA AND VOMITING THAT IS NOT CONTROLLED WITH YOUR NAUSEA MEDICATION  *UNUSUAL SHORTNESS OF BREATH  *UNUSUAL BRUISING OR BLEEDING  TENDERNESS IN MOUTH AND THROAT WITH OR WITHOUT PRESENCE OF ULCERS  *URINARY PROBLEMS  *BOWEL PROBLEMS  UNUSUAL RASH Items with * indicate a potential emergency and should be followed up as soon as possible.  Feel free to call the clinic you have any questions or concerns. The clinic phone number is [phone].  Please show the CHEMO ALERT CARD at check-in to the Emergency Department and triage nurse.   

## 2014-10-09 NOTE — Patient Instructions (Signed)
Use the albuterol inhaler as prescribed when needed Follow-up in 2 weeks prior to your next scheduled cycle of immunotherapy

## 2014-10-21 ENCOUNTER — Other Ambulatory Visit (HOSPITAL_BASED_OUTPATIENT_CLINIC_OR_DEPARTMENT_OTHER): Payer: Medicare Other

## 2014-10-21 ENCOUNTER — Telehealth: Payer: Self-pay | Admitting: Physician Assistant

## 2014-10-21 ENCOUNTER — Ambulatory Visit: Payer: Medicare Other

## 2014-10-21 ENCOUNTER — Ambulatory Visit (HOSPITAL_BASED_OUTPATIENT_CLINIC_OR_DEPARTMENT_OTHER): Payer: Medicare Other | Admitting: Physician Assistant

## 2014-10-21 ENCOUNTER — Encounter: Payer: Self-pay | Admitting: Physician Assistant

## 2014-10-21 ENCOUNTER — Ambulatory Visit (HOSPITAL_BASED_OUTPATIENT_CLINIC_OR_DEPARTMENT_OTHER): Payer: Medicare Other

## 2014-10-21 VITALS — BP 117/50 | HR 72 | Temp 98.5°F | Resp 18 | Ht 70.0 in | Wt 150.3 lb

## 2014-10-21 DIAGNOSIS — C3412 Malignant neoplasm of upper lobe, left bronchus or lung: Secondary | ICD-10-CM | POA: Diagnosis present

## 2014-10-21 DIAGNOSIS — B370 Candidal stomatitis: Secondary | ICD-10-CM

## 2014-10-21 DIAGNOSIS — R5383 Other fatigue: Secondary | ICD-10-CM | POA: Diagnosis not present

## 2014-10-21 DIAGNOSIS — Z95828 Presence of other vascular implants and grafts: Secondary | ICD-10-CM

## 2014-10-21 DIAGNOSIS — C3492 Malignant neoplasm of unspecified part of left bronchus or lung: Secondary | ICD-10-CM

## 2014-10-21 DIAGNOSIS — Z79899 Other long term (current) drug therapy: Secondary | ICD-10-CM

## 2014-10-21 DIAGNOSIS — Z5112 Encounter for antineoplastic immunotherapy: Secondary | ICD-10-CM

## 2014-10-21 LAB — COMPREHENSIVE METABOLIC PANEL (CC13)
ALBUMIN: 2.5 g/dL — AB (ref 3.5–5.0)
ALT: 18 U/L (ref 0–55)
ANION GAP: 6 meq/L (ref 3–11)
AST: 30 U/L (ref 5–34)
Alkaline Phosphatase: 212 U/L — ABNORMAL HIGH (ref 40–150)
BUN: 13.5 mg/dL (ref 7.0–26.0)
CO2: 26 meq/L (ref 22–29)
Calcium: 8.7 mg/dL (ref 8.4–10.4)
Chloride: 105 mEq/L (ref 98–109)
Creatinine: 0.9 mg/dL (ref 0.7–1.3)
EGFR: 81 mL/min/{1.73_m2} — AB (ref >90)
GLUCOSE: 128 mg/dL (ref 70–140)
POTASSIUM: 4.3 meq/L (ref 3.5–5.1)
SODIUM: 138 meq/L (ref 136–145)
Total Bilirubin: 0.73 mg/dL (ref 0.20–1.20)
Total Protein: 6.4 g/dL (ref 6.4–8.3)

## 2014-10-21 LAB — CBC WITH DIFFERENTIAL/PLATELET
BASO%: 0.5 % (ref 0.0–2.0)
Basophils Absolute: 0 10*3/uL (ref 0.0–0.1)
EOS ABS: 0.1 10*3/uL (ref 0.0–0.5)
EOS%: 0.7 % (ref 0.0–7.0)
HEMATOCRIT: 29.8 % — AB (ref 38.4–49.9)
HGB: 9.7 g/dL — ABNORMAL LOW (ref 13.0–17.1)
LYMPH#: 0.6 10*3/uL — AB (ref 0.9–3.3)
LYMPH%: 7.4 % — ABNORMAL LOW (ref 14.0–49.0)
MCH: 29.7 pg (ref 27.2–33.4)
MCHC: 32.5 g/dL (ref 32.0–36.0)
MCV: 91.4 fL (ref 79.3–98.0)
MONO#: 0.6 10*3/uL (ref 0.1–0.9)
MONO%: 8.1 % (ref 0.0–14.0)
NEUT#: 6.4 10*3/uL (ref 1.5–6.5)
NEUT%: 83.3 % — ABNORMAL HIGH (ref 39.0–75.0)
PLATELETS: 211 10*3/uL (ref 140–400)
RBC: 3.26 10*6/uL — ABNORMAL LOW (ref 4.20–5.82)
RDW: 15.9 % — AB (ref 11.0–14.6)
WBC: 7.7 10*3/uL (ref 4.0–10.3)

## 2014-10-21 MED ORDER — FLUCONAZOLE 100 MG PO TABS: 100.0000 mg | ORAL_TABLET | Freq: Every day | ORAL | Status: AC

## 2014-10-21 MED ORDER — SODIUM CHLORIDE 0.9 % IJ SOLN
10.0000 mL | INTRAMUSCULAR | Status: DC | PRN
  Administered 2014-10-21: 10 mL via INTRAVENOUS
  Filled 2014-10-21: qty 10

## 2014-10-21 MED ORDER — SODIUM CHLORIDE 0.9 % IV SOLN
Freq: Once | INTRAVENOUS | Status: AC
  Administered 2014-10-21: 15:00:00 via INTRAVENOUS

## 2014-10-21 MED ORDER — SODIUM CHLORIDE 0.9 % IJ SOLN
10.0000 mL | INTRAMUSCULAR | Status: DC | PRN
  Administered 2014-10-21: 10 mL
  Filled 2014-10-21: qty 10

## 2014-10-21 MED ORDER — SODIUM CHLORIDE 0.9 % IV SOLN
200.0000 mg | Freq: Once | INTRAVENOUS | Status: AC
  Administered 2014-10-21: 200 mg via INTRAVENOUS
  Filled 2014-10-21: qty 20

## 2014-10-21 MED ORDER — HEPARIN SOD (PORK) LOCK FLUSH 100 UNIT/ML IV SOLN
500.0000 [IU] | Freq: Once | INTRAVENOUS | Status: AC | PRN
  Administered 2014-10-21: 500 [IU]
  Filled 2014-10-21: qty 5

## 2014-10-21 NOTE — Telephone Encounter (Signed)
Pt confirmed labs/ov per 07/12 POF, gave pt AVS and Calendar..... KJ, sent msg to move chemo

## 2014-10-21 NOTE — Patient Instructions (Signed)

## 2014-10-21 NOTE — Progress Notes (Signed)
[facility]  [phone] [phone]  OFFICE VISIT PROGRESS NOTE  [name], [place] [address]  DIAGNOSIS: Recurrent non-small cell lung cancer initially diagnosed as Stage IIIA (T1b, N2, M0) non-small cell lung cancer, adenocarcinoma with negative EGFR mutation and negative ALK gene translocation  PRIOR THERAPY:  1) Concurrent chemoradiation with weekly carboplatin for an AUC of 2 and paclitaxel at 45 mg per meter squared concurrent with radiation therapy under the care of Dr. Lisbeth Renshaw. He status post 7 cycles of chemotherapy. Last dose was given 02/11/2013 with partial response. 2) Systemic chemotherapy with carboplatin for AUC of 5 and Alimta 500 mg/M2 every 3 weeks. First dose on 01/07/2014. Status post 6 cycles. Last dose was given 04/22/2014 with stable disease.  CURRENT THERAPY: Immunotherapy with Nivolumab 3 MG/M2 every 2 weeks. First dose given on 07/01/2014. Status post 7 cycles  DISEASE STAGE: Metastatic non-small cell lung cancer   CHEMOTHERAPY INTENT: Palliative.  CURRENT # OF CHEMOTHERAPY CYCLES: 8  CURRENT ANTIEMETICS: Zofran, dexamethasone, and Compazine for at home use  CURRENT SMOKING STATUS: Former smoker quit 12/20/1992  ORAL CHEMOTHERAPY AND CONSENT: n/a  CURRENT BISPHOSPHONATES USE: None  PAIN MANAGEMENT: Tramadol  NARCOTICS INDUCED CONSTIPATION: None  LIVING WILL AND CODE STATUS: ?  INTERVAL HISTORY: Christopher Roach 81 y.o. male returns for followup visit accompanied by several  family members.  He has tolerated his treatment with immunotherapy without significant adverse events with the exception of increased fatigue.  He had no difficulty with his breathing, no skin rash or diarrhea.    He continues to have hoarseness in his voice that predates his treatment with immunotherapy.  He has family members think the hoarseness might be a little bit worse. He reports not having any appetite.  His weight has remained relatively stable. The patient denied having any nausea or vomiting. He denied having any fever or chills. He voiced no other specific complaints.  MEDICAL HISTORY: Past Medical History  Diagnosis Date  . Hypertension   . Hyperlipidemia   . Hypothyroidism   . Abnormal LFTs   . Gallbladder polyp   . BPH (benign prostatic hyperplasia)   . Actinic keratosis   . Lung nodule     benign BX 03/24/2008  . Anemia   . H/O asbestos exposure   . Pneumonia     hx of walking PNA  . Shortness of breath     with exertion  . Constipation     from medications  . Arthritis   . Kidney stone August 2014    hx of  . Birth mark     left arm and shoulder; Dark purple on upper L chest ;NOT A RASH    ALLERGIES:  is allergic to codeine.  MEDICATIONS:  Current Outpatient Prescriptions  Medication Sig Dispense Refill  . albuterol (PROVENTIL HFA;VENTOLIN HFA) 108 (90 BASE) MCG/ACT inhaler Inhale 2 puffs into the lungs every 6 (six) hours as needed for wheezing or shortness of breath. 1 Inhaler 2  . ALPRAZolam (XANAX) 1 MG tablet Take 1 mg by mouth daily as needed for anxiety.   0  . Alum & Mag Hydroxide-Simeth (MAGIC MOUTHWASH W/LIDOCAINE) SOLN Take 5 mLs by mouth 4 (four) times daily. SWISH AND SPIT 240 mL 1  . amLODipine (NORVASC) 10 MG tablet Take 10 mg by mouth at bedtime.     [place] Kitchen aspirin EC 81 MG tablet Take 81 mg by mouth daily.    . benzonatate (TESSALON)  200 MG capsule Take 1 capsule (200 mg total) by mouth 2 (two) times daily as needed for cough. 40 capsule 0  . Eszopiclone (ESZOPICLONE) 3 MG TABS Take 3 mg by mouth at bedtime.     . folic acid (FOLVITE) 1 MG tablet Take 1 tablet (1 mg total) by mouth daily. 30 tablet 3  . levothyroxine (SYNTHROID, LEVOTHROID) 88 MCG tablet Take 88 mcg by mouth daily before breakfast.    . lidocaine-prilocaine (EMLA) cream Apply 1 application topically as needed. 30 g 0  . prochlorperazine (COMPAZINE) 10 MG tablet Take 1 tablet (10 mg  total) by mouth every 6 (six) hours as needed for nausea or vomiting. 30 tablet 0  . tamsulosin (FLOMAX) 0.4 MG CAPS Take 0.4 mg by mouth at bedtime.     . temazepam (RESTORIL) 15 MG capsule Take 15 mg by mouth at bedtime.   0  . traMADol (ULTRAM) 50 MG tablet Take 1 tablet (50 mg total) by mouth every 6 (six) hours as needed. 30 tablet 0  . fluconazole (DIFLUCAN) 100 MG tablet Take 1 tablet (100 mg total) by mouth daily. 10 tablet 0   No current facility-administered medications for this visit.   Facility-Administered Medications Ordered in Other Visits  Medication Dose Route Frequency Provider Last Rate Last Dose  . heparin lock flush 100 unit/mL  500 Units Intracatheter Once PRN Curt Bears, MD      . nivolumab (OPDIVO) 200 mg in sodium chloride 0.9 % 100 mL chemo infusion  200 mg Intravenous Once Curt Bears, MD      . sodium chloride 0.9 % injection 10 mL  10 mL Intracatheter PRN Curt Bears, MD        SURGICAL HISTORY:  Past Surgical History  Procedure Laterality Date  . Back surgery      Dr Collie Siad 1978 and 1982  . Cervical disc surgery    . Right foot surgery      plantar fascitis  . Right shoulder surgery      torn rotator cuff  . Right ankle fracture         . Right wrist fracture    . Foot surgery Left   . Tonsillectomy    . Appendectomy    . Colonoscopy    . Flexible bronchoscopy N/A 12/07/2012    Procedure: FLEXIBLE BRONCHOSCOPY;  Surgeon: Gaye Pollack, MD;  Location: [facility] OR;  Service: Thoracic;  Laterality: N/A;  . Video assisted thoracoscopy (vats)/thorocotomy Left 12/07/2012    Procedure: VIDEO ASSISTED THORACOSCOPY (VATS)/ EXPLORATORY THOROCOTOMY;  Surgeon: Gaye Pollack, MD;  Location: [facility] OR;  Service: Thoracic;  Laterality: Left;    REVIEW OF SYSTEMS:  Constitutional: positive for fatigue Eyes: negative Ears, nose, mouth, throat, and face: positive for hoarseness and sore mouth Respiratory: positive for cough and dyspnea on exertion Cardiovascular:  negative Gastrointestinal: negative Genitourinary:negative Integument/breast: negative Hematologic/lymphatic: negative Musculoskeletal:positive for arthralgias Neurological: negative Behavioral/Psych: negative Endocrine: negative Allergic/Immunologic: negative   PHYSICAL EXAMINATION: General appearance: alert, cooperative, appears stated age and no distress Head: Normocephalic, without obvious abnormality, atraumatic Neck: no adenopathy, no JVD, supple, symmetrical, trachea midline and thyroid not enlarged, symmetric, no tenderness/mass/nodules Lymph nodes: Cervical, supraclavicular, and axillary nodes normal. Resp: diminished breath sounds LUL, dullness to percussion LUL and wheezes bilaterally and faint Cardio: regular rate and rhythm, S1, S2 normal, no murmur, click, rub or gallop GI: soft, non-tender; bowel sounds normal; no masses,  no organomegaly Extremities: extremities normal, atraumatic, no cyanosis or edema Neurologic: Alert  and oriented X 3, normal strength and tone. Normal symmetric reflexes. Normal coordination and gait Mouth: Reveals moderate thrush  ECOG PERFORMANCE STATUS: 1 - Symptomatic but completely ambulatory  Blood pressure 117/50, pulse 72, temperature 98.5 F (36.9 C), temperature source Oral, resp. rate 18, height $RemoveBe'5\' 10"'DYqSQTALJ$  (1.778 m), weight 150 lb 4.8 oz (68.176 kg).  LABORATORY DATA: Lab Results  Component Value Date   WBC 7.7 10/21/2014   HGB 9.7* 10/21/2014   HCT 29.8* 10/21/2014   MCV 91.4 10/21/2014   PLT 211 10/21/2014      Chemistry      Component Value Date/Time   NA 138 10/21/2014 1204   NA 140 09/22/2014 1230   K 4.3 10/21/2014 1204   K 3.7 09/22/2014 1230   CL 108 09/22/2014 1230   CO2 26 10/21/2014 1204   CO2 26 09/22/2014 1230   BUN 13.5 10/21/2014 1204   BUN 14 09/22/2014 1230   CREATININE 0.9 10/21/2014 1204   CREATININE 0.82 09/22/2014 1230      Component Value Date/Time   CALCIUM 8.7 10/21/2014 1204   CALCIUM 8.4*  09/22/2014 1230   ALKPHOS 212* 10/21/2014 1204   ALKPHOS 129* 12/05/2012 1348   AST 30 10/21/2014 1204   AST 44* 12/05/2012 1348   ALT 18 10/21/2014 1204   ALT 35 12/05/2012 1348   BILITOT 0.73 10/21/2014 1204   BILITOT 0.5 12/05/2012 1348       RADIOGRAPHIC STUDIES: Ir Fluoro Guide Cv Line Right  09/22/2014   CLINICAL DATA:  Lung carcinoma. Difficult venous access. Long-term venous access needs are anticipated.  EXAM: TUNNELED PORT CATHETER PLACEMENT WITH ULTRASOUND AND FLUOROSCOPIC GUIDANCE  FLUOROSCOPY TIME:  24 seconds, 4 mGy  ANESTHESIA/SEDATION: Intravenous Fentanyl and Versed were administered as conscious sedation during continuous cardiorespiratory monitoring by the radiology RN, with a total moderate sedation time of 18 minutes.  TECHNIQUE: The procedure, risks, benefits, and alternatives were explained to the patient. Questions regarding the procedure were encouraged and answered. The patient understands and consents to the procedure. As antibiotic prophylaxis, cefazolin 2 g was ordered pre-procedure and administered intravenously within one hour of incision. Patency of the right IJ vein was confirmed with ultrasound with image documentation. An appropriate skin site was determined. Skin site was marked. Region was prepped using maximum barrier technique including cap and mask, sterile gown, sterile gloves, large sterile sheet, and Chlorhexidine as cutaneous antisepsis. The region was infiltrated locally with 1% lidocaine. Under real-time ultrasound guidance, the right IJ vein was accessed with a 21 gauge micropuncture needle; the needle tip within the vein was confirmed with ultrasound image documentation. Needle was exchanged over a 018 guidewire for transitional dilator which allowed passage of the [facility] wire into the IVC. Over this, the transitional dilator was exchanged for a 5 [place] MPA catheter. A small incision was made on the right anterior chest wall and a subcutaneous pocket  fashioned. The power-injectable port was positioned and its catheter tunneled to the right IJ dermatotomy site. The MPA catheter was exchanged over an Amplatz wire for a peel-away sheath, through which the port catheter, which had been trimmed to the appropriate length, was advanced and positioned under fluoroscopy with its tip at the cavoatrial junction. Spot chest radiograph confirms good catheter position and no pneumothorax. The pocket was closed with deep interrupted and subcuticular continuous 3-0 Monocryl sutures. The port was flushed per protocol. The incisions were covered with Dermabond then covered with a sterile dressing.  COMPLICATIONS: COMPLICATIONS None immediate  IMPRESSION:  Technically successful right IJ power-injectable port catheter placement. Ready for routine use.   Electronically Signed   By: Lucrezia Europe M.D.   On: 09/22/2014 15:21   Ir US Guide Vasc Access Right  09/22/2014   CLINICAL DATA:  Lung carcinoma. Difficult venous access. Long-term venous access needs are anticipated.  EXAM: TUNNELED PORT CATHETER PLACEMENT WITH ULTRASOUND AND FLUOROSCOPIC GUIDANCE  FLUOROSCOPY TIME:  24 seconds, 4 mGy  ANESTHESIA/SEDATION: Intravenous Fentanyl and Versed were administered as conscious sedation during continuous cardiorespiratory monitoring by the radiology RN, with a total moderate sedation time of 18 minutes.  TECHNIQUE: The procedure, risks, benefits, and alternatives were explained to the patient. Questions regarding the procedure were encouraged and answered. The patient understands and consents to the procedure. As antibiotic prophylaxis, cefazolin 2 g was ordered pre-procedure and administered intravenously within one hour of incision. Patency of the right IJ vein was confirmed with ultrasound with image documentation. An appropriate skin site was determined. Skin site was marked. Region was prepped using maximum barrier technique including cap and mask, sterile gown, sterile gloves, large  sterile sheet, and Chlorhexidine as cutaneous antisepsis. The region was infiltrated locally with 1% lidocaine. Under real-time ultrasound guidance, the right IJ vein was accessed with a 21 gauge micropuncture needle; the needle tip within the vein was confirmed with ultrasound image documentation. Needle was exchanged over a 018 guidewire for transitional dilator which allowed passage of the [facility] wire into the IVC. Over this, the transitional dilator was exchanged for a 5 [place] MPA catheter. A small incision was made on the right anterior chest wall and a subcutaneous pocket fashioned. The power-injectable port was positioned and its catheter tunneled to the right IJ dermatotomy site. The MPA catheter was exchanged over an Amplatz wire for a peel-away sheath, through which the port catheter, which had been trimmed to the appropriate length, was advanced and positioned under fluoroscopy with its tip at the cavoatrial junction. Spot chest radiograph confirms good catheter position and no pneumothorax. The pocket was closed with deep interrupted and subcuticular continuous 3-0 Monocryl sutures. The port was flushed per protocol. The incisions were covered with Dermabond then covered with a sterile dressing.  COMPLICATIONS: COMPLICATIONS None immediate  IMPRESSION: Technically successful right IJ power-injectable port catheter placement. Ready for routine use.   Electronically Signed   By: Lucrezia Europe M.D.   On: 09/22/2014 15:21      ASSESSMENT/PLAN: This is a very pleasant 81 year old white male recently diagnosed with a stage IIIa non-small cell lung cancer currently undergoing concurrent chemoradiation with weekly carboplatin and paclitaxel status post 7 cycles.  He then received treatment with systemic chemotherapy consisting of carboplatin and Alimta status post 6 cycles for recurrent disease. The last CT scan of the chest, abdomen and pelvis showed no evidence for disease progression.   He is currently  being treated with  immunotherapy with Nivolumab 3 MG/KG every 2 weeks. He is now status post 7 cycles and is tolerating this treatment without difficulty. He is having some fatigue and decreased appetite. His hypothyroidism is managed by his primary care physician Dr. [name]. The patient was discussed with and also seen by Dr. Julien Nordmann. He will proceed with cycle #8 today as scheduled. I will recheck another TSH today. For the oral candidiasis a prescription for Diflucan was sent to his pharmacy of record via E scribe. He may continue to use Magic mouthwash for symptomatic relief. His hoarseness is long-standing and predates his treatment with immunotherapy.  This may be slightly worse now because of oral candidiasis.    He will follow-up in 2 weeks prior to cycle #9 of his immunotherapy with Nivolumab with a restaging CT scan of the chest, abdomen and pelvis to reevaluate his disease.   The patient was advised to call immediately if he has any concerning symptoms in the interval. All questions were answered. The patient knows to call the clinic with any problems, questions or concerns. We can certainly see the patient much sooner if necessary.   Carlton Adam, PA-C 10/21/2014  ADDENDUM: Hematology/Oncology Attending: I had a face to face encounter with the patient. I recommended his care plan. This is a very pleasant 81 years old white male with recurrent non-small cell lung cancer, adenocarcinoma who is currently undergoing treatment with immunotherapy with Nivolumab status post 7 cycles. He is tolerating his treatment well except for the persistent hoarseness of his voice as well as fatigue and mild pain on the left side of the chest. I recommended for the patient to proceed with cycle #8 today as scheduled. He would have repeat CT scan of the chest, abdomen and pelvis before his visit in 2 weeks for restaging of his disease. For the oral thrush, I will start the patient on Diflucan. He was  advised to call immediately if he has any concerning symptoms in the interval.  Disclaimer: This note was dictated with voice recognition software. Similar sounding words can inadvertently be transcribed and may be missed upon review. Eilleen Kempf., MD 10/23/2014

## 2014-10-21 NOTE — Patient Instructions (Signed)
Take the Diflucan as prescribed to treat the thrush in your mouth Follow-up in 2 weeks with a restaging CT scan of the chest, abdomen and pelvis to reevaluate your disease

## 2014-10-21 NOTE — Patient Instructions (Signed)
[facility] Discharge Instructions for Patients Receiving Chemotherapy  Today you received the following chemotherapy agents:  Nivolumab.  To help prevent nausea and vomiting after your treatment, we encourage you to take your nausea medication as directed.   If you develop nausea and vomiting that is not controlled by your nausea medication, call the clinic.   BELOW ARE SYMPTOMS THAT SHOULD BE REPORTED IMMEDIATELY:  *FEVER GREATER THAN 100.5 F  *CHILLS WITH OR WITHOUT FEVER  NAUSEA AND VOMITING THAT IS NOT CONTROLLED WITH YOUR NAUSEA MEDICATION  *UNUSUAL SHORTNESS OF BREATH  *UNUSUAL BRUISING OR BLEEDING  TENDERNESS IN MOUTH AND THROAT WITH OR WITHOUT PRESENCE OF ULCERS  *URINARY PROBLEMS  *BOWEL PROBLEMS  UNUSUAL RASH Items with * indicate a potential emergency and should be followed up as soon as possible.  Feel free to call the clinic you have any questions or concerns. The clinic phone number is [phone].  Please show the CHEMO ALERT CARD at check-in to the Emergency Department and triage nurse.   

## 2014-10-22 ENCOUNTER — Telehealth: Payer: Self-pay | Admitting: *Deleted

## 2014-10-22 NOTE — Telephone Encounter (Signed)
I have adjusted 7/26

## 2014-10-31 ENCOUNTER — Encounter (HOSPITAL_COMMUNITY): Payer: Self-pay

## 2014-10-31 ENCOUNTER — Ambulatory Visit (HOSPITAL_COMMUNITY)
Admission: RE | Admit: 2014-10-31 | Discharge: 2014-10-31 | Disposition: A | Discharge status: Home / Self Care | Payer: Medicare Other | Source: Ambulatory Visit | Attending: Physician Assistant | Admitting: Physician Assistant

## 2014-10-31 DIAGNOSIS — R05 Cough: Secondary | ICD-10-CM | POA: Insufficient documentation

## 2014-10-31 DIAGNOSIS — J9819 Other pulmonary collapse: Secondary | ICD-10-CM | POA: Insufficient documentation

## 2014-10-31 DIAGNOSIS — R079 Chest pain, unspecified: Secondary | ICD-10-CM | POA: Diagnosis not present

## 2014-10-31 DIAGNOSIS — C3492 Malignant neoplasm of unspecified part of left bronchus or lung: Secondary | ICD-10-CM | POA: Diagnosis not present

## 2014-10-31 DIAGNOSIS — C3412 Malignant neoplasm of upper lobe, left bronchus or lung: Secondary | ICD-10-CM

## 2014-10-31 DIAGNOSIS — N400 Enlarged prostate without lower urinary tract symptoms: Secondary | ICD-10-CM | POA: Diagnosis not present

## 2014-10-31 MED ORDER — IOHEXOL 300 MG/ML  SOLN
100.0000 mL | Freq: Once | INTRAMUSCULAR | Status: AC | PRN
  Administered 2014-10-31: 100 mL via INTRAVENOUS

## 2014-11-04 ENCOUNTER — Other Ambulatory Visit (HOSPITAL_BASED_OUTPATIENT_CLINIC_OR_DEPARTMENT_OTHER): Payer: Medicare Other

## 2014-11-04 ENCOUNTER — Ambulatory Visit (HOSPITAL_BASED_OUTPATIENT_CLINIC_OR_DEPARTMENT_OTHER): Payer: Medicare Other | Admitting: Physician Assistant

## 2014-11-04 ENCOUNTER — Ambulatory Visit: Payer: Medicare Other

## 2014-11-04 ENCOUNTER — Ambulatory Visit (HOSPITAL_BASED_OUTPATIENT_CLINIC_OR_DEPARTMENT_OTHER): Payer: Medicare Other

## 2014-11-04 VITALS — BP 117/61 | HR 82 | Temp 98.3°F | Resp 18 | Ht 70.0 in | Wt 147.4 lb

## 2014-11-04 DIAGNOSIS — E039 Hypothyroidism, unspecified: Secondary | ICD-10-CM | POA: Diagnosis not present

## 2014-11-04 DIAGNOSIS — Z95828 Presence of other vascular implants and grafts: Secondary | ICD-10-CM

## 2014-11-04 DIAGNOSIS — C3412 Malignant neoplasm of upper lobe, left bronchus or lung: Secondary | ICD-10-CM

## 2014-11-04 DIAGNOSIS — R5382 Chronic fatigue, unspecified: Secondary | ICD-10-CM

## 2014-11-04 DIAGNOSIS — R5383 Other fatigue: Secondary | ICD-10-CM | POA: Diagnosis not present

## 2014-11-04 DIAGNOSIS — Z79899 Other long term (current) drug therapy: Secondary | ICD-10-CM

## 2014-11-04 DIAGNOSIS — C3492 Malignant neoplasm of unspecified part of left bronchus or lung: Secondary | ICD-10-CM

## 2014-11-04 LAB — CBC WITH DIFFERENTIAL/PLATELET
BASO%: 0.9 % (ref 0.0–2.0)
Basophils Absolute: 0.1 10*3/uL (ref 0.0–0.1)
EOS ABS: 0.1 10*3/uL (ref 0.0–0.5)
EOS%: 1.1 % (ref 0.0–7.0)
HCT: 29.5 % — ABNORMAL LOW (ref 38.4–49.9)
HGB: 9.6 g/dL — ABNORMAL LOW (ref 13.0–17.1)
LYMPH#: 0.6 10*3/uL — AB (ref 0.9–3.3)
LYMPH%: 7 % — ABNORMAL LOW (ref 14.0–49.0)
MCH: 29.9 pg (ref 27.2–33.4)
MCHC: 32.7 g/dL (ref 32.0–36.0)
MCV: 91.6 fL (ref 79.3–98.0)
MONO#: 0.7 10*3/uL (ref 0.1–0.9)
MONO%: 8.3 % (ref 0.0–14.0)
NEUT#: 7.1 10*3/uL — ABNORMAL HIGH (ref 1.5–6.5)
NEUT%: 82.7 % — ABNORMAL HIGH (ref 39.0–75.0)
PLATELETS: 201 10*3/uL (ref 140–400)
RBC: 3.22 10*6/uL — ABNORMAL LOW (ref 4.20–5.82)
RDW: 16.4 % — AB (ref 11.0–14.6)
WBC: 8.6 10*3/uL (ref 4.0–10.3)

## 2014-11-04 LAB — COMPREHENSIVE METABOLIC PANEL (CC13)
ALT: 23 U/L (ref 0–55)
AST: 44 U/L — ABNORMAL HIGH (ref 5–34)
Albumin: 2.4 g/dL — ABNORMAL LOW (ref 3.5–5.0)
Alkaline Phosphatase: 270 U/L — ABNORMAL HIGH (ref 40–150)
Anion Gap: 8 mEq/L (ref 3–11)
BUN: 15.6 mg/dL (ref 7.0–26.0)
CHLORIDE: 107 meq/L (ref 98–109)
CO2: 25 mEq/L (ref 22–29)
Calcium: 8.4 mg/dL (ref 8.4–10.4)
Creatinine: 0.9 mg/dL (ref 0.7–1.3)
EGFR: 79 mL/min/{1.73_m2} — ABNORMAL LOW (ref >90)
Glucose: 154 mg/dl — ABNORMAL HIGH (ref 70–140)
Potassium: 3.9 mEq/L (ref 3.5–5.1)
Sodium: 139 mEq/L (ref 136–145)
TOTAL PROTEIN: 6.5 g/dL (ref 6.4–8.3)
Total Bilirubin: 0.7 mg/dL (ref 0.20–1.20)

## 2014-11-04 LAB — TSH CHCC: TSH: 3.422 m[IU]/L (ref 0.320–4.118)

## 2014-11-04 MED ORDER — HEPARIN SOD (PORK) LOCK FLUSH 100 UNIT/ML IV SOLN
500.0000 [IU] | Freq: Once | INTRAVENOUS | Status: AC
  Administered 2014-11-04: 500 [IU] via INTRAVENOUS
  Filled 2014-11-04: qty 5

## 2014-11-04 MED ORDER — BENZONATATE 200 MG PO CAPS: 200.0000 mg | ORAL_CAPSULE | Freq: Two times a day (BID) | ORAL | Status: AC | PRN

## 2014-11-04 MED ORDER — SODIUM CHLORIDE 0.9 % IJ SOLN
10.0000 mL | INTRAMUSCULAR | Status: DC | PRN
  Administered 2014-11-04: 10 mL via INTRAVENOUS
  Filled 2014-11-04: qty 10

## 2014-11-04 NOTE — Patient Instructions (Signed)

## 2014-11-05 ENCOUNTER — Telehealth: Payer: Self-pay

## 2014-11-05 NOTE — Progress Notes (Addendum)
[facility]  [phone] [phone]  OFFICE VISIT PROGRESS NOTE  [name], [place] [address]  DIAGNOSIS: Recurrent non-small cell lung cancer initially diagnosed as Stage IIIA (T1b, N2, M0) non-small cell lung cancer, adenocarcinoma with negative EGFR mutation and negative ALK gene translocation  PRIOR THERAPY:  1) Concurrent chemoradiation with weekly carboplatin for an AUC of 2 and paclitaxel at 45 mg per meter squared concurrent with radiation therapy under the care of Dr. Lisbeth Renshaw. He status post 7 cycles of chemotherapy. Last dose was given 02/11/2013 with partial response. 2) Systemic chemotherapy with carboplatin for AUC of 5 and Alimta 500 mg/M2 every 3 weeks. First dose on 01/07/2014. Status post 6 cycles. Last dose was given 04/22/2014 with stable disease.  CURRENT THERAPY: Immunotherapy with Nivolumab 3 MG/M2 every 2 weeks. First dose given on 07/01/2014. Status post 8 cycles  DISEASE STAGE: Metastatic non-small cell lung cancer   CHEMOTHERAPY INTENT: Palliative.  CURRENT # OF CHEMOTHERAPY CYCLES: 8  CURRENT ANTIEMETICS: Zofran, dexamethasone, and Compazine for at home use  CURRENT SMOKING STATUS: Former smoker quit 12/20/1992  ORAL CHEMOTHERAPY AND CONSENT: n/a  CURRENT BISPHOSPHONATES USE: None  PAIN MANAGEMENT: Tramadol  NARCOTICS INDUCED CONSTIPATION: None  LIVING WILL AND CODE STATUS: ?  INTERVAL HISTORY: Christopher Roach 81 y.o. male returns for followup visit accompanied by several  family members.  He has tolerated his treatment with immunotherapy without significant adverse events with the exception of increased fatigue.  He had no difficulty with his breathing, no skin rash or diarrhea.    He continues to have hoarseness in his voice that predates his treatment with immunotherapy.  He thinks the hoarseness is worse and his family agrees. He feels weaker.  He reports not having any  appetite. His weight has remained relatively stable. The patient denied having any nausea or vomiting. He denied having any fever or chills. He voiced no other specific complaints. He requests a refill for his Tessalon pearls that manages his cough quite well. He recently had a restaging CT scan of chest, abdomen and pelvis and presents to discuss the results.  MEDICAL HISTORY: Past Medical History  Diagnosis Date  . Hypertension   . Hyperlipidemia   . Hypothyroidism   . Abnormal LFTs   . Gallbladder polyp   . BPH (benign prostatic hyperplasia)   . Actinic keratosis   . Lung nodule     benign BX 03/24/2008  . Anemia   . H/O asbestos exposure   . Pneumonia     hx of walking PNA  . Shortness of breath     with exertion  . Constipation     from medications  . Arthritis   . Kidney stone August 2014    hx of  . Birth mark     left arm and shoulder; Dark purple on upper L chest ;NOT A RASH    ALLERGIES:  is allergic to codeine.  MEDICATIONS:  Current Outpatient Prescriptions  Medication Sig Dispense Refill  . albuterol (PROVENTIL HFA;VENTOLIN HFA) 108 (90 BASE) MCG/ACT inhaler Inhale 2 puffs into the lungs every 6 (six) hours as needed for wheezing or shortness of breath. 1 Inhaler 2  . ALPRAZolam (XANAX) 1 MG tablet Take 1 mg by mouth daily as needed for anxiety.   0  . Alum & Mag Hydroxide-Simeth (MAGIC MOUTHWASH W/LIDOCAINE) SOLN Take 5 mLs by mouth 4 (four) times daily. SWISH AND SPIT 240 mL 1  . amLODipine (  NORVASC) 10 MG tablet Take 10 mg by mouth at bedtime.     Marland Kitchen aspirin EC 81 MG tablet Take 81 mg by mouth daily.    . benzonatate (TESSALON) 200 MG capsule Take 1 capsule (200 mg total) by mouth 2 (two) times daily as needed for cough. 40 capsule 1  . Eszopiclone (ESZOPICLONE) 3 MG TABS Take 3 mg by mouth at bedtime.     . fluconazole (DIFLUCAN) 100 MG tablet Take 1 tablet (100 mg total) by mouth daily. 10 tablet 0  . folic acid (FOLVITE) 1 MG tablet Take 1 tablet (1 mg  total) by mouth daily. 30 tablet 3  . levothyroxine (SYNTHROID, LEVOTHROID) 88 MCG tablet Take 88 mcg by mouth daily before breakfast.    . lidocaine-prilocaine (EMLA) cream Apply 1 application topically as needed. 30 g 0  . prochlorperazine (COMPAZINE) 10 MG tablet Take 1 tablet (10 mg total) by mouth every 6 (six) hours as needed for nausea or vomiting. 30 tablet 0  . tamsulosin (FLOMAX) 0.4 MG CAPS Take 0.4 mg by mouth at bedtime.     . temazepam (RESTORIL) 15 MG capsule Take 15 mg by mouth at bedtime.   0  . traMADol (ULTRAM) 50 MG tablet Take 1 tablet (50 mg total) by mouth every 6 (six) hours as needed. 30 tablet 0   No current facility-administered medications for this visit.    SURGICAL HISTORY:  Past Surgical History  Procedure Laterality Date  . Back surgery      Dr Collie Siad 1978 and 1982  . Cervical disc surgery    . Right foot surgery      plantar fascitis  . Right shoulder surgery      torn rotator cuff  . Right ankle fracture         . Right wrist fracture    . Foot surgery Left   . Tonsillectomy    . Appendectomy    . Colonoscopy    . Flexible bronchoscopy N/A 12/07/2012    Procedure: FLEXIBLE BRONCHOSCOPY;  Surgeon: Gaye Pollack, MD;  Location: [facility] OR;  Service: Thoracic;  Laterality: N/A;  . Video assisted thoracoscopy (vats)/thorocotomy Left 12/07/2012    Procedure: VIDEO ASSISTED THORACOSCOPY (VATS)/ EXPLORATORY THOROCOTOMY;  Surgeon: Gaye Pollack, MD;  Location: [facility] OR;  Service: Thoracic;  Laterality: Left;    REVIEW OF SYSTEMS:  Constitutional: positive for fatigue Eyes: negative Ears, nose, mouth, throat, and face: positive for hoarseness and sore mouth Respiratory: positive for cough and dyspnea on exertion Cardiovascular: negative Gastrointestinal: negative Genitourinary:negative Integument/breast: negative Hematologic/lymphatic: negative Musculoskeletal:positive for arthralgias Neurological: negative Behavioral/Psych: negative Endocrine:  negative Allergic/Immunologic: negative   PHYSICAL EXAMINATION: General appearance: alert, cooperative, appears stated age and no distress Head: Normocephalic, without obvious abnormality, atraumatic Neck: no adenopathy, no JVD, supple, symmetrical, trachea midline and thyroid not enlarged, symmetric, no tenderness/mass/nodules Lymph nodes: Cervical, supraclavicular, and axillary nodes normal. Resp: diminished breath sounds LUL, dullness to percussion LUL and wheezes bilaterally and faint Cardio: regular rate and rhythm, S1, S2 normal, no murmur, click, rub or gallop GI: soft, non-tender; bowel sounds normal; no masses,  no organomegaly Extremities: extremities normal, atraumatic, no cyanosis or edema Neurologic: Alert and oriented X 3, normal strength and tone. Normal symmetric reflexes. Normal coordination and gait Mouth: Reveals moderate thrush  ECOG PERFORMANCE STATUS: 1 - Symptomatic but completely ambulatory  Blood pressure 117/61, pulse 82, temperature 98.3 F (36.8 C), temperature source Oral, resp. rate 18, height $RemoveBe'5\' 10"'dgPJEBwPu$  (1.778 m), weight 147  lb 6.4 oz (66.86 kg), SpO2 95 %.  LABORATORY DATA: Lab Results  Component Value Date   WBC 8.6 11/04/2014   HGB 9.6* 11/04/2014   HCT 29.5* 11/04/2014   MCV 91.6 11/04/2014   PLT 201 11/04/2014      Chemistry      Component Value Date/Time   NA 139 11/04/2014 1349   NA 140 09/22/2014 1230   K 3.9 11/04/2014 1349   K 3.7 09/22/2014 1230   CL 108 09/22/2014 1230   CO2 25 11/04/2014 1349   CO2 26 09/22/2014 1230   BUN 15.6 11/04/2014 1349   BUN 14 09/22/2014 1230   CREATININE 0.9 11/04/2014 1349   CREATININE 0.82 09/22/2014 1230      Component Value Date/Time   CALCIUM 8.4 11/04/2014 1349   CALCIUM 8.4* 09/22/2014 1230   ALKPHOS 270* 11/04/2014 1349   ALKPHOS 129* 12/05/2012 1348   AST 44* 11/04/2014 1349   AST 44* 12/05/2012 1348   ALT 23 11/04/2014 1349   ALT 35 12/05/2012 1348   BILITOT 0.70 11/04/2014 1349    BILITOT 0.5 12/05/2012 1348       RADIOGRAPHIC STUDIES: Ct Chest W Contrast  10/31/2014   CLINICAL DATA:  Subsequent encounter for left-sided lung cancer with left lateral chest pain cough.  EXAM: CT CHEST, ABDOMEN, AND PELVIS WITH CONTRAST  TECHNIQUE: Multidetector CT imaging of the chest, abdomen and pelvis was performed following the standard protocol during bolus administration of intravenous contrast.  CONTRAST:  167mL OMNIPAQUE IOHEXOL 300 MG/ML  SOLN  COMPARISON:  09/05/2014.  FINDINGS: CT CHEST FINDINGS  Mediastinum/Nodes: There is no axillary lymphadenopathy. 10 mm lymph node adjacent to the left scalene musculature was 13 mm in short axis previously. 9 mm short axis anterior mediastinal lymph node (image 17 series 2) was 7 mm previously. 8 mm short axis right paratracheal lymph node on image 19 today was 5 mm previously. Stable appearance of 11 mm short axis subcarinal lymph node.  Heart is upper limits of normal for size. Coronary artery calcification is noted. No evidence for pericardial effusion.  Lungs/Pleura: Left lung remains collapsed with areas of cystic change/ necrosis most prominent in the upper lobe. While a portion of the left lower lobe was aerated on the previous study, this has become completely consolidated in the interval and there is evidence of further volume loss in the left hemi thorax. Airways to the left upper and lower lobes are severely attenuated. 10 mm right apical nodule is stable subpleural nodules in the right anterior right lung are associated with other areas of pleural calcification bilaterally, compatible with previous asbestos exposure. Scattered areas of tree-in-bud opacity in the posterior right lower lobe may be related to aspiration and/or atypical infection.  Musculoskeletal: Bone windows reveal no worrisome lytic or sclerotic osseous lesions. The tip of the right-sided Port-A-Cath is positioned in the upper right atrium.  CT ABDOMEN AND PELVIS FINDINGS   Hepatobiliary: No focal abnormality within the liver parenchyma. Liver contour remains nodular. Lateral segment left liver markedly atrophic. Gallbladder is nondistended. No intrahepatic or extrahepatic biliary dilation.  Pancreas: No focal mass lesion. No dilatation of the main duct. No intraparenchymal cyst. No peripancreatic edema.  Spleen: No splenomegaly. No focal mass lesion.  Adrenals/Urinary Tract: Stable 14 mm left adrenal nodule. 12 x 22 mm right adrenal nodule is stable. Scattered cysts are seen in the kidneys bilaterally. No evidence for hydroureteronephrosis. Urinary bladder is unremarkable.  Stomach/Bowel: Stomach is nondistended. No gastric wall thickening. No  evidence of outlet obstruction. Duodenum is normally positioned as is the ligament of Treitz. No small bowel wall thickening. No small bowel dilatation. Terminal ileum is normal. The appendix is not visualized, but there is no edema or inflammation in the region of the cecum. Prominent stool volume suggests constipation. Diverticular changes are noted in the left colon without evidence of diverticulitis.  Vascular/Lymphatic: There is abdominal aortic atherosclerosis without aneurysm. Penetrating ulcer versus ulcerated plaque versus focal short segment dissection of the abdominal aorta is unchanged. No retroperitoneal lymphadenopathy. 11 mm short axis hepatoduodenal ligament lymph node is stable. No evidence for pelvic sidewall lymphadenopathy.  Reproductive: Prostate gland is markedly enlarged and heterogeneous. Seminal vesicles are unremarkable.  Other: No intraperitoneal free fluid.  Musculoskeletal: Small sclerotic lesion in the left iliac crest is stable. No worrisome lytic or sclerotic osseous abnormality.  IMPRESSION: 1. Continued further collapse of the left lung with essentially no aerated portion remaining on today's study. There is marked attenuation of both the upper and lower lobe airways. Imaging features suggest central progression  of neoplasm. 2. Hepatic contour suggests underlying cirrhosis. 3. Stable bilateral adrenal nodules. Continued attention recommended as metastatic disease is a concern. 4. Prostatomegaly.   Electronically Signed   By: Misty Stanley M.D.   On: 10/31/2014 14:58   Ct Abdomen Pelvis W Contrast  10/31/2014   CLINICAL DATA:  Subsequent encounter for left-sided lung cancer with left lateral chest pain cough.  EXAM: CT CHEST, ABDOMEN, AND PELVIS WITH CONTRAST  TECHNIQUE: Multidetector CT imaging of the chest, abdomen and pelvis was performed following the standard protocol during bolus administration of intravenous contrast.  CONTRAST:  168mL OMNIPAQUE IOHEXOL 300 MG/ML  SOLN  COMPARISON:  09/05/2014.  FINDINGS: CT CHEST FINDINGS  Mediastinum/Nodes: There is no axillary lymphadenopathy. 10 mm lymph node adjacent to the left scalene musculature was 13 mm in short axis previously. 9 mm short axis anterior mediastinal lymph node (image 17 series 2) was 7 mm previously. 8 mm short axis right paratracheal lymph node on image 19 today was 5 mm previously. Stable appearance of 11 mm short axis subcarinal lymph node.  Heart is upper limits of normal for size. Coronary artery calcification is noted. No evidence for pericardial effusion.  Lungs/Pleura: Left lung remains collapsed with areas of cystic change/ necrosis most prominent in the upper lobe. While a portion of the left lower lobe was aerated on the previous study, this has become completely consolidated in the interval and there is evidence of further volume loss in the left hemi thorax. Airways to the left upper and lower lobes are severely attenuated. 10 mm right apical nodule is stable subpleural nodules in the right anterior right lung are associated with other areas of pleural calcification bilaterally, compatible with previous asbestos exposure. Scattered areas of tree-in-bud opacity in the posterior right lower lobe may be related to aspiration and/or atypical  infection.  Musculoskeletal: Bone windows reveal no worrisome lytic or sclerotic osseous lesions. The tip of the right-sided Port-A-Cath is positioned in the upper right atrium.  CT ABDOMEN AND PELVIS FINDINGS  Hepatobiliary: No focal abnormality within the liver parenchyma. Liver contour remains nodular. Lateral segment left liver markedly atrophic. Gallbladder is nondistended. No intrahepatic or extrahepatic biliary dilation.  Pancreas: No focal mass lesion. No dilatation of the main duct. No intraparenchymal cyst. No peripancreatic edema.  Spleen: No splenomegaly. No focal mass lesion.  Adrenals/Urinary Tract: Stable 14 mm left adrenal nodule. 12 x 22 mm right adrenal nodule is stable. Scattered  cysts are seen in the kidneys bilaterally. No evidence for hydroureteronephrosis. Urinary bladder is unremarkable.  Stomach/Bowel: Stomach is nondistended. No gastric wall thickening. No evidence of outlet obstruction. Duodenum is normally positioned as is the ligament of Treitz. No small bowel wall thickening. No small bowel dilatation. Terminal ileum is normal. The appendix is not visualized, but there is no edema or inflammation in the region of the cecum. Prominent stool volume suggests constipation. Diverticular changes are noted in the left colon without evidence of diverticulitis.  Vascular/Lymphatic: There is abdominal aortic atherosclerosis without aneurysm. Penetrating ulcer versus ulcerated plaque versus focal short segment dissection of the abdominal aorta is unchanged. No retroperitoneal lymphadenopathy. 11 mm short axis hepatoduodenal ligament lymph node is stable. No evidence for pelvic sidewall lymphadenopathy.  Reproductive: Prostate gland is markedly enlarged and heterogeneous. Seminal vesicles are unremarkable.  Other: No intraperitoneal free fluid.  Musculoskeletal: Small sclerotic lesion in the left iliac crest is stable. No worrisome lytic or sclerotic osseous abnormality.  IMPRESSION: 1. Continued  further collapse of the left lung with essentially no aerated portion remaining on today's study. There is marked attenuation of both the upper and lower lobe airways. Imaging features suggest central progression of neoplasm. 2. Hepatic contour suggests underlying cirrhosis. 3. Stable bilateral adrenal nodules. Continued attention recommended as metastatic disease is a concern. 4. Prostatomegaly.   Electronically Signed   By: Misty Stanley M.D.   On: 10/31/2014 14:58      ASSESSMENT/PLAN: This is a very pleasant 81 year old white male recently diagnosed with a stage IIIa non-small cell lung cancer currently undergoing concurrent chemoradiation with weekly carboplatin and paclitaxel status post 7 cycles.  He then received treatment with systemic chemotherapy consisting of carboplatin and Alimta status post 6 cycles for recurrent disease. The last CT scan of the chest, abdomen and pelvis showed no evidence for disease progression.   He is currently being treated with  immunotherapy with Nivolumab 3 MG/KG every 2 weeks. He is now status post 8 cycles and is tolerating this treatment without difficulty. He is having some increased  fatigue and weakness as well as decreased appetite. His hypothyroidism is managed by his primary care physician Dr. [name]. The patient was discussed with and also seen by Dr. Julien Nordmann. His recent restaging CT scan revealed further collapse of the left lung with basically no aeration within that lung at all. There is marked attenuation of both upper and lower lobe airways and imaging features suggest central progression of neoplasm. The patient was given the option of continuing on his current treatment, being switched to systemic chemotherapy versus discontinuing treatment altogether and being referred for hospice and palliative care. Patient prefers to discontinue treatment at this time. We will make the hospice and palliative care referral for him and Dr. Julien Nordmann will be the  physician of record. His Tessalon Perles were refilled via E scribed to his pharmacy of record.  He may continue to use Magic mouthwash for symptomatic relief. His hoarseness is long-standing and predates his treatment with immunotherapy.     The patient was advised to call immediately if he has any concerning symptoms in the interval. All questions were answered. The patient knows to call the clinic with any problems, questions or concerns. We can certainly see the patient much sooner if necessary.   Carlton Adam PA-C  ADDENDUM: Hematology/Oncology Attending: I had a face to face encounter with the patient today. I recommended his care plan. This is a very pleasant 81  years old white male with recurrent non-small cell lung cancer, adenocarcinoma status post several chemotherapy regimens as well as concurrent chemoradiation and most recently treated with immunotherapy with Nivolumab status post 8 cycles. He tolerated his treatment well except for increasing fatigue as well as the left sided chest pain and shortness of breath and hoarseness of his voice. His recent CT scan of the chest showed further collapse of the left lung and no evidence of disease improvement after the immunotherapy. I had a lengthy discussion with the patient and his family about his current condition and treatment options. The patient was given the option of continuing his immunotherapy with Nivolumab versus switching treatment to systemic chemotherapy versus palliative care and hospice referral. After discussion of further options the patient be referred palliative care at this point. We will refer him to the palliative care and hospice service for evaluation. We will continue to see the patient on as-needed basis at this point. He will continue with his current pain medication as well as Tessalon Perles for cough. He was advised to call immediately if he has any other concerning symptoms.  Disclaimer: This note was  dictated with voice recognition software. Similar sounding words can inadvertently be transcribed and may be missed upon review. Eilleen Kempf., MD 11/05/2014

## 2014-11-05 NOTE — Telephone Encounter (Signed)
Faxed hospice referral to [facility].

## 2014-11-05 NOTE — Patient Instructions (Signed)
Your recent restaging CT scan showed further collapse of your lung and some evidence of disease progression. You have opted to discontinue all treatment at this time. We will make hospice and palliative care referral for you and be available to you if needed.

## 2014-11-06 ENCOUNTER — Telehealth: Payer: Self-pay

## 2014-11-06 NOTE — Telephone Encounter (Signed)
tonya from [facility] of [facility] called. She stated patient requested a DNR order. Verbal order given. Kenney Houseman will be taking a DNR form to the patients house.

## 2014-11-06 NOTE — Telephone Encounter (Signed)
OK 

## 2015-02-18 ENCOUNTER — Telehealth: Payer: Self-pay | Admitting: *Deleted

## 2015-02-20 ENCOUNTER — Telehealth: Payer: Self-pay | Admitting: *Deleted

## 2015-02-20 NOTE — Telephone Encounter (Signed)
Christopher Roach from [facility] called advised he dropped off Death Certificate at 024OX 03/11/23 with an Therapist, sports. Death Certificate located under Dr. Armanda Roach in Triage. Informed Christopher Roach we have DC however MD is not in on Fridays and it will be signed on Monday. He can come to pick it up at that time. Christopher Roach thanked me for the call and will come Monday for pick up. No further concerns.

## 2015-03-12 NOTE — Telephone Encounter (Signed)
Hospcie RN called-Pt expired today. POF sent

## 2015-03-12 DEATH — deceased

## 2015-09-29 ENCOUNTER — Other Ambulatory Visit: Payer: Self-pay | Admitting: Nurse Practitioner

## 2016-02-02 IMAGING — XA IR FLUORO GUIDE CV LINE*R*
1 series · 1 of 1 positions shown · non-contrast
Comparison: none

CLINICAL DATA: Lung carcinoma. Difficult venous access. Long-term
venous access needs are anticipated.
TECHNIQUE: The procedure, risks, benefits, and alternatives were explained to
the patient. Questions regarding the procedure were encouraged and
answered. The patient understands and consents to the procedure. As
antibiotic prophylaxis, cefazolin 2 g was ordered pre-procedure and
administered intravenously within one hour of incision. Patency of
the right IJ vein was confirmed with ultrasound with image
documentation. An appropriate skin site was determined. Skin site
was marked. Region was prepped using maximum barrier technique
including cap and mask, sterile gown, sterile gloves, large sterile
sheet, and Chlorhexidine as cutaneous antisepsis. The region was
infiltrated locally with 1% lidocaine. Under real-time ultrasound
guidance, the right IJ vein was accessed with a 21 gauge
micropuncture needle; the needle tip within the vein was confirmed
with ultrasound image documentation. Needle was exchanged over a 018
guidewire for transitional dilator which allowed passage of the
Benson wire into the IVC. Over this, the transitional dilator was
exchanged for a 5 French MPA catheter. A small incision was made on
the right anterior chest wall and a subcutaneous pocket fashioned.
The power-injectable port was positioned and its catheter tunneled
to the right IJ dermatotomy site. The MPA catheter was exchanged
over an Amplatz wire for a peel-away sheath, through which the port
catheter, which had been trimmed to the appropriate length, was
advanced and positioned under fluoroscopy with its tip at the
cavoatrial junction. Spot chest radiograph confirms good catheter
position and no pneumothorax. The pocket was closed with deep
interrupted and subcuticular continuous 3-0 Monocryl sutures. The
port was flushed per protocol. The incisions were covered with
Dermabond then covered with a sterile dressing.

COMPLICATIONS:
COMPLICATIONS
None immediate

[Series 300: ir fluoro guide cv line right · 1 of 1 slices shown]
[im 1/1]
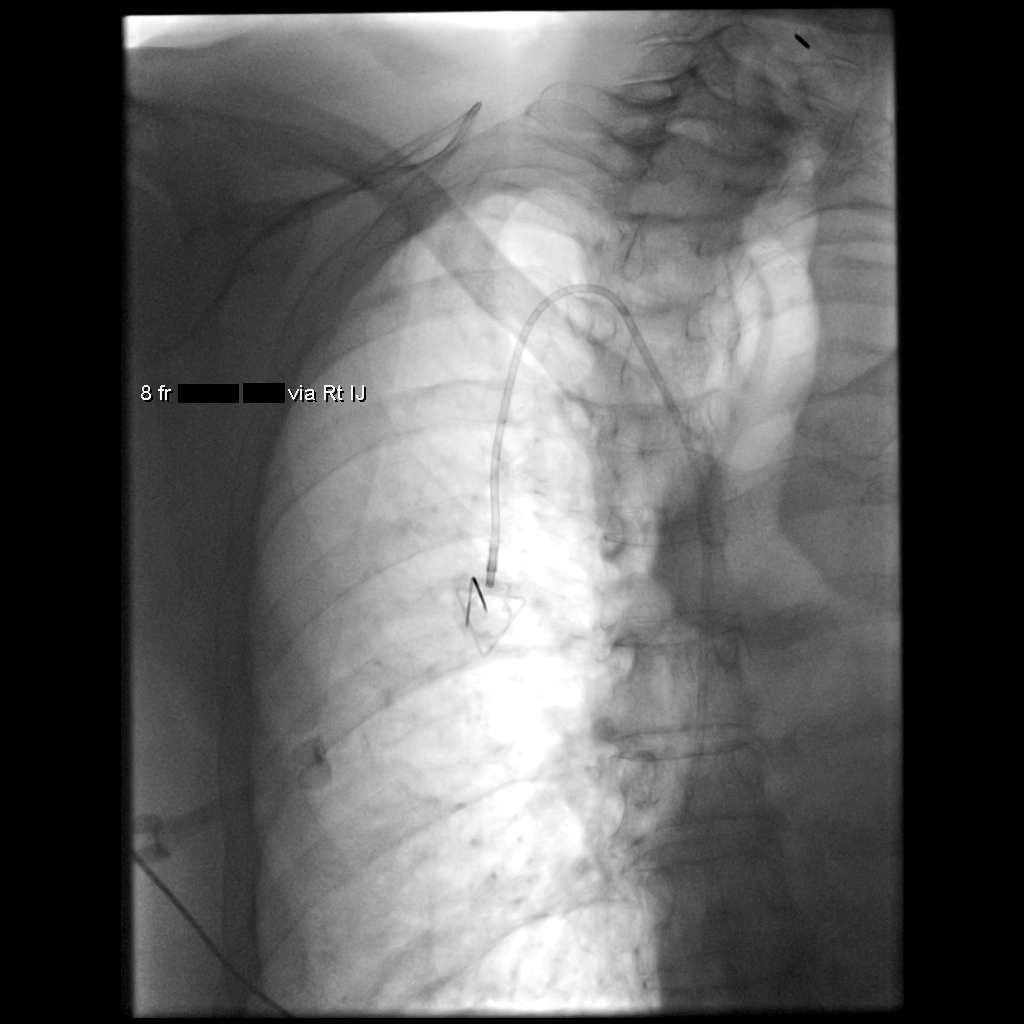

[1 of 1 positions shown; findings below may reference images not displayed]

EXAM:
TUNNELED PORT CATHETER PLACEMENT WITH ULTRASOUND AND FLUOROSCOPIC
GUIDANCE

FLUOROSCOPY TIME:  24 seconds, 4 mGy

ANESTHESIA/SEDATION:
Intravenous Fentanyl and Versed were administered as conscious
sedation during continuous cardiorespiratory monitoring by the
radiology RN, with a total moderate sedation time of 18 minutes.
IMPRESSION: Technically successful right IJ power-injectable port catheter
placement. Ready for routine use.
